# Patient Record
Sex: Male | Born: 1965
Health system: Southern US, Community
[De-identification: ages and names within clinical notes are randomized; demographics above are authoritative.]

## PROBLEM LIST (undated history)

## (undated) DIAGNOSIS — I10 Essential (primary) hypertension: Secondary | ICD-10-CM

## (undated) DIAGNOSIS — E781 Pure hyperglyceridemia: Secondary | ICD-10-CM

## (undated) DIAGNOSIS — K7460 Unspecified cirrhosis of liver: Secondary | ICD-10-CM

## (undated) DIAGNOSIS — T4145XA Adverse effect of unspecified anesthetic, initial encounter: Secondary | ICD-10-CM

## (undated) DIAGNOSIS — M109 Gout, unspecified: Secondary | ICD-10-CM

## (undated) DIAGNOSIS — E119 Type 2 diabetes mellitus without complications: Secondary | ICD-10-CM

## (undated) DIAGNOSIS — T8859XA Other complications of anesthesia, initial encounter: Secondary | ICD-10-CM

## (undated) DIAGNOSIS — Z8719 Personal history of other diseases of the digestive system: Secondary | ICD-10-CM

## (undated) DIAGNOSIS — R7303 Prediabetes: Secondary | ICD-10-CM

## (undated) DIAGNOSIS — K760 Fatty (change of) liver, not elsewhere classified: Secondary | ICD-10-CM

## (undated) DIAGNOSIS — N200 Calculus of kidney: Secondary | ICD-10-CM

## (undated) HISTORY — PX: COLONOSCOPY: SHX174

## (undated) HISTORY — DX: Gout, unspecified: M10.9

## (undated) HISTORY — DX: Fatty (change of) liver, not elsewhere classified: K76.0

## (undated) HISTORY — DX: Unspecified cirrhosis of liver: K74.60

## (undated) HISTORY — PX: VASECTOMY: SHX75

## (undated) HISTORY — PX: LIVER BIOPSY: SHX301

## (undated) HISTORY — DX: Calculus of kidney: N20.0

## (undated) HISTORY — DX: Prediabetes: R73.03

## (undated) HISTORY — PX: ESOPHAGOGASTRODUODENOSCOPY: SHX1529

## (undated) HISTORY — DX: Pure hyperglyceridemia: E78.1

---

## 1995-09-08 DIAGNOSIS — K7460 Unspecified cirrhosis of liver: Secondary | ICD-10-CM

## 1995-09-08 HISTORY — DX: Unspecified cirrhosis of liver: K74.60

## 2013-01-27 ENCOUNTER — Other Ambulatory Visit: Payer: Self-pay | Admitting: *Deleted

## 2013-01-27 MED ORDER — FLUTICASONE PROPIONATE 50 MCG/ACT NA SUSP: 2.0000 | Freq: Every day | NASAL | Status: DC

## 2013-02-01 ENCOUNTER — Other Ambulatory Visit: Payer: Self-pay | Admitting: *Deleted

## 2013-02-01 MED ORDER — FLUTICASONE PROPIONATE 50 MCG/ACT NA SUSP: 2.0000 | Freq: Every day | NASAL | Status: DC

## 2013-02-08 ENCOUNTER — Other Ambulatory Visit: Payer: Self-pay | Admitting: *Deleted

## 2013-02-09 ENCOUNTER — Other Ambulatory Visit: Payer: Self-pay | Admitting: *Deleted

## 2013-02-09 MED ORDER — HYOSCYAMINE SULFATE ER 0.375 MG PO TB12: 0.3750 mg | ORAL_TABLET | Freq: Two times a day (BID) | ORAL | Status: DC | PRN

## 2013-02-09 MED ORDER — PANTOPRAZOLE SODIUM 40 MG PO TBEC: 40.0000 mg | DELAYED_RELEASE_TABLET | Freq: Every day | ORAL | Status: DC

## 2013-03-17 ENCOUNTER — Other Ambulatory Visit: Payer: Self-pay | Admitting: Family Medicine

## 2013-03-17 ENCOUNTER — Encounter: Payer: Self-pay | Admitting: Family Medicine

## 2013-03-24 ENCOUNTER — Encounter: Payer: Self-pay | Admitting: Nurse Practitioner

## 2013-03-24 ENCOUNTER — Ambulatory Visit (INDEPENDENT_AMBULATORY_CARE_PROVIDER_SITE_OTHER): Payer: BC Managed Care – PPO | Admitting: Nurse Practitioner

## 2013-03-24 ENCOUNTER — Encounter: Payer: Self-pay | Admitting: Family Medicine

## 2013-03-24 VITALS — BP 126/90 | Temp 98.4°F | Wt 174.2 lb

## 2013-03-24 DIAGNOSIS — J069 Acute upper respiratory infection, unspecified: Secondary | ICD-10-CM

## 2013-03-24 DIAGNOSIS — K219 Gastro-esophageal reflux disease without esophagitis: Secondary | ICD-10-CM

## 2013-03-24 MED ORDER — HYDROCODONE-HOMATROPINE 5-1.5 MG/5ML PO SYRP: 5.0000 mL | ORAL_SOLUTION | ORAL | Status: DC | PRN

## 2013-03-24 MED ORDER — PANTOPRAZOLE SODIUM 40 MG PO TBEC: 40.0000 mg | DELAYED_RELEASE_TABLET | Freq: Every day | ORAL | Status: DC

## 2013-03-24 MED ORDER — LEVOFLOXACIN 500 MG PO TABS: 500.0000 mg | ORAL_TABLET | Freq: Every day | ORAL | Status: DC

## 2013-03-24 MED ORDER — PREDNISONE 20 MG PO TABS: ORAL_TABLET | ORAL | Status: DC

## 2013-03-24 NOTE — Progress Notes (Signed)
Subjective:  Presents complaints of cough and congestion that began this morning. No fever. Minimal vomiting. Slightly loose stools. His pharmacy did not have his Protonix, had to switch to omeprazole which is now working as well. Frequent cough. Runny nose. Facial area pressure. No headache. No sore throat. Some ear pain. No wheezing. Mild off and on dizziness. Nothing severe at this point.  Objective:   BP 126/90  Temp(Src) 98.4 F (36.9 C) (Oral)  Wt 174 lb 3.2 oz (79.017 kg) NAD. Alert, oriented. TMs mild clear effusion, no erythema. Pharynx injected with slightly green PND noted. Neck supple with mild soft slightly tender adenopathy. Lungs clear. Heart regular rate rhythm. Abdomen soft nondistended with distinct epigastric area tenderness noted.   Assessment:Acute upper respiratory infection  GERD (gastroesophageal reflux disease)  Plan: Meds ordered this encounter  Medications  . DISCONTD: pantoprazole (PROTONIX) 40 MG tablet    Sig: Take 1 tablet (40 mg total) by mouth daily.    Dispense:  90 tablet    Refill:  3    Needs office visit    Order Specific Question:  Supervising Provider    Answer:  Merlyn Albert [[id number]]  . pantoprazole (PROTONIX) 40 MG tablet    Sig: Take 1 tablet (40 mg total) by mouth daily.    Dispense:  90 tablet    Refill:  0    Needs office visit    Order Specific Question:  Supervising Provider    Answer:  Merlyn Albert [[id number]]  . levofloxacin (LEVAQUIN) 500 MG tablet    Sig: Take 1 tablet (500 mg total) by mouth daily.    Dispense:  10 tablet    Refill:  0    Order Specific Question:  Supervising Provider    Answer:  Merlyn Albert [[id number]]  . HYDROcodone-homatropine (HYCODAN) 5-1.5 MG/5ML syrup    Sig: Take 5 mLs by mouth every 4 (four) hours as needed for cough.    Dispense:  120 mL    Refill:  0    Order Specific Question:  Supervising Provider    Answer:  Merlyn Albert [[id number]]  . predniSONE (DELTASONE) 20 MG tablet    Sig: 3 po qd  x 3 d then 2 po qd x 3 d then 1 po qd x 3 d    Dispense:  18 tablet    Refill:  0    Order Specific Question:  Supervising Provider    Answer:  Merlyn Albert [[id number]]   Restart Protonix as directed. Hold on prednisone prescription in case it is needed over the weekend for sinus pressure. Call back next week if no improvement, sooner if worse.

## 2013-03-24 NOTE — Assessment & Plan Note (Signed)
Restart Protonix as directed.

## 2013-03-31 ENCOUNTER — Encounter: Payer: Self-pay | Admitting: Family Medicine

## 2013-03-31 ENCOUNTER — Ambulatory Visit (INDEPENDENT_AMBULATORY_CARE_PROVIDER_SITE_OTHER): Payer: BC Managed Care – PPO | Admitting: Family Medicine

## 2013-03-31 VITALS — BP 112/78 | Ht 67.0 in | Wt 176.6 lb

## 2013-03-31 DIAGNOSIS — E785 Hyperlipidemia, unspecified: Secondary | ICD-10-CM | POA: Insufficient documentation

## 2013-03-31 DIAGNOSIS — Z720 Tobacco use: Secondary | ICD-10-CM | POA: Insufficient documentation

## 2013-03-31 DIAGNOSIS — F17200 Nicotine dependence, unspecified, uncomplicated: Secondary | ICD-10-CM

## 2013-03-31 DIAGNOSIS — R7309 Other abnormal glucose: Secondary | ICD-10-CM

## 2013-03-31 DIAGNOSIS — Z79899 Other long term (current) drug therapy: Secondary | ICD-10-CM

## 2013-03-31 DIAGNOSIS — Z0000 Encounter for general adult medical examination without abnormal findings: Secondary | ICD-10-CM

## 2013-03-31 DIAGNOSIS — R739 Hyperglycemia, unspecified: Secondary | ICD-10-CM

## 2013-03-31 DIAGNOSIS — M109 Gout, unspecified: Secondary | ICD-10-CM | POA: Insufficient documentation

## 2013-03-31 MED ORDER — DICYCLOMINE HCL 20 MG PO TABS: 20.0000 mg | ORAL_TABLET | Freq: Three times a day (TID) | ORAL | Status: DC | PRN

## 2013-03-31 MED ORDER — ALLOPURINOL 300 MG PO TABS: 300.0000 mg | ORAL_TABLET | Freq: Every day | ORAL | Status: DC

## 2013-03-31 MED ORDER — GEMFIBROZIL 600 MG PO TABS: 600.0000 mg | ORAL_TABLET | Freq: Two times a day (BID) | ORAL | Status: DC

## 2013-03-31 MED ORDER — PRAVASTATIN SODIUM 20 MG PO TABS: 20.0000 mg | ORAL_TABLET | Freq: Every day | ORAL | Status: DC

## 2013-03-31 MED ORDER — FLUTICASONE PROPIONATE 50 MCG/ACT NA SUSP: NASAL | Status: DC

## 2013-03-31 NOTE — Progress Notes (Signed)
  Subjective:    Patient ID: Gregory Thompson, male    DOB: [birth year], 47 y.o.   MRN: [id number]  HPI patient in today for wellness exam. Takes all his medicines. Denies any chest tightness pressure pain denies rectal bleeding hematuria. Denies joint discomforts. He does relate that he is having intermittent abdominal cramping that goes along with his irritable bowel. He is here today for a wellness exam. Safety measures dietary measures all discussed. He has no family history of prostate cancer or colon cancer.    Review of Systems  Constitutional: Negative for fever, activity change and appetite change.  HENT: Negative for congestion, rhinorrhea and neck pain.   Eyes: Negative for discharge.  Respiratory: Negative for cough and wheezing.   Cardiovascular: Negative for chest pain.  Gastrointestinal: Negative for vomiting, abdominal pain and blood in stool.       He does get intermittent abdominal cramping and discomfort associated with his irritable bowel along with intermittent loose stools. Nonbloody.  Genitourinary: Negative for frequency and difficulty urinating.  Skin: Negative for rash.  Allergic/Immunologic: Negative for environmental allergies and food allergies.  Neurological: Negative for weakness and headaches.  Psychiatric/Behavioral: Negative for agitation.       Objective:   Physical Exam  Nursing note and vitals reviewed. Constitutional: He appears well-developed and well-nourished.  HENT:  Head: Normocephalic and atraumatic.  Right Ear: External ear normal.  Left Ear: External ear normal.  Nose: Nose normal.  Mouth/Throat: Oropharynx is clear and moist.  Eyes: EOM are normal. Pupils are equal, round, and reactive to light.  Neck: Normal range of motion. Neck supple. No thyromegaly present.  Cardiovascular: Normal rate, regular rhythm and normal heart sounds.   No murmur heard. Pulmonary/Chest: Effort normal and breath sounds normal. No respiratory distress. He  has no wheezes.  Abdominal: Soft. Bowel sounds are normal. He exhibits no distension and no mass. There is no tenderness.  Genitourinary: Penis normal.  Musculoskeletal: Normal range of motion. He exhibits no edema.  Lymphadenopathy:    He has no cervical adenopathy.  Neurological: He is alert. He exhibits normal muscle tone.  Skin: Skin is warm and dry. No erythema.  Psychiatric: He has a normal mood and affect. His behavior is normal. Judgment normal.          Assessment & Plan:  Wellness-safety measures dietary measures all discussed. Patient does have general health issues that need followup on lab work these were ordered. Continue current medications. Try bentyl 3 times a day when necessary if it works better than Levbid then continued this new approach followup again in 6 months sooner problems he was counseled to quit smoking

## 2013-05-05 ENCOUNTER — Encounter: Payer: Self-pay | Admitting: Family Medicine

## 2013-05-05 ENCOUNTER — Ambulatory Visit (INDEPENDENT_AMBULATORY_CARE_PROVIDER_SITE_OTHER): Payer: BC Managed Care – PPO | Admitting: Nurse Practitioner

## 2013-05-05 ENCOUNTER — Encounter: Payer: Self-pay | Admitting: Nurse Practitioner

## 2013-05-05 VITALS — BP 132/78 | Ht 67.0 in | Wt 175.6 lb

## 2013-05-05 DIAGNOSIS — M62838 Other muscle spasm: Secondary | ICD-10-CM

## 2013-05-05 MED ORDER — METHOCARBAMOL 750 MG PO TABS: 750.0000 mg | ORAL_TABLET | Freq: Three times a day (TID) | ORAL | Status: DC

## 2013-05-09 ENCOUNTER — Encounter: Payer: Self-pay | Admitting: Nurse Practitioner

## 2013-05-09 NOTE — Progress Notes (Signed)
Subjective:  Presents complaints of upper back pain for the past 4 days. No specific history of injury. Complaints of muscle spasms and pain with movement of his arms above his head.  Objective:   BP 132/78  Ht 5\' 7"  (1.702 m)  Wt 175 lb 9.6 oz (79.652 kg)  BMI 27.5 kg/m2 NAD. Alert, oriented. Lungs clear. Heart regular rate rhythm. Extremely tight tender muscles noted along the neck and upper back area. Good ROM of the neck with minimal tenderness. No shoulder joint line tenderness. Limited active ROM of both shoulders due to tenderness along the trapezius area. Hand and arm strength 5+ bilateral. Strong radial pulses bilaterally. Sensation grossly intact.  Assessment:Muscle spasms of head and/or neck  Plan: Meds ordered this encounter  Medications  . methocarbamol (ROBAXIN) 750 MG tablet    Sig: Take 1 tablet (750 mg total) by mouth 3 (three) times daily. Prn muscle spasms    Dispense:  30 tablet    Refill:  0    Order Specific Question:  Supervising Provider    Answer:  Merlyn Albert [[id number]]   Hold on anti-inflammatories due to GI symptoms. Ice/heat to affected area. Stretching exercises. Massage therapy. Call back if symptoms persist.

## 2013-06-16 ENCOUNTER — Telehealth: Payer: Self-pay | Admitting: Family Medicine

## 2013-06-16 MED ORDER — HYOSCYAMINE SULFATE ER 0.375 MG PO TB12: 0.3750 mg | ORAL_TABLET | Freq: Two times a day (BID) | ORAL | Status: DC | PRN

## 2013-06-16 NOTE — Telephone Encounter (Signed)
Please clarify with the patient that he is talking about levbid, this is the long-acting form of Tikosyn mean which is a once twice daily he'll. If so he may have 60 with 5 refills.

## 2013-06-16 NOTE — Telephone Encounter (Signed)
Rx sent electronically to [facility]. Patient notified.

## 2013-06-16 NOTE — Telephone Encounter (Signed)
Patient is not satisfied with change in stomach medication, wants to go back to hyoscyamine. Would a refill of this to [facility].

## 2013-07-25 ENCOUNTER — Encounter: Payer: Self-pay | Admitting: Family Medicine

## 2013-07-25 ENCOUNTER — Ambulatory Visit (INDEPENDENT_AMBULATORY_CARE_PROVIDER_SITE_OTHER): Payer: BC Managed Care – PPO | Admitting: Family Medicine

## 2013-07-25 VITALS — BP 124/88 | Ht 68.0 in | Wt 173.0 lb

## 2013-07-25 DIAGNOSIS — M62838 Other muscle spasm: Secondary | ICD-10-CM

## 2013-07-25 MED ORDER — DIAZEPAM 5 MG PO TABS: 5.0000 mg | ORAL_TABLET | Freq: Every evening | ORAL | Status: DC | PRN

## 2013-07-25 NOTE — Progress Notes (Signed)
  Subjective:    Patient ID: Gregory Thompson, male    DOB: September 23, 1965, 47 y.o.   MRN: [id number]  HPILeft hip pain radiating down left leg. Started 3 days. Taking tylenol. No relief.   Started sund, lower back  Leg was hurting, feeling like spasms  Went to work, hadf more pain  Severe pain today  Hurting thru the night.  No numbness in leg, no weakness  Was using clutch Just using tylenol   Review of Systems No back pain no flank pain no weight loss no weight gain ROS otherwise negative    Objective:   Physical Exam  Alert and significant distress. Lungs clear. Heart rare rhythm. Spine nontender. Negative straight leg raise. Pulses sensation good. Deep thigh tenderness to palpation.      Assessment & Plan:  Impression muscle spasm severe plan diazepam 5 mg 4 times a day. Local measures discussed. Work excuse given. Expect gradual resolution. WSL

## 2013-11-07 ENCOUNTER — Other Ambulatory Visit: Payer: Self-pay | Admitting: *Deleted

## 2013-11-07 MED ORDER — FLUTICASONE PROPIONATE 50 MCG/ACT NA SUSP: NASAL | Status: DC

## 2013-11-11 ENCOUNTER — Other Ambulatory Visit: Payer: Self-pay | Admitting: Family Medicine

## 2013-11-11 ENCOUNTER — Encounter: Payer: Self-pay | Admitting: *Deleted

## 2013-11-11 NOTE — Telephone Encounter (Signed)
Last seen 07/25/13

## 2013-11-14 ENCOUNTER — Ambulatory Visit (INDEPENDENT_AMBULATORY_CARE_PROVIDER_SITE_OTHER): Payer: BC Managed Care – PPO | Admitting: Family Medicine

## 2013-11-14 ENCOUNTER — Encounter: Payer: Self-pay | Admitting: Family Medicine

## 2013-11-14 VITALS — BP 138/90 | Ht 68.0 in | Wt 182.0 lb

## 2013-11-14 DIAGNOSIS — E785 Hyperlipidemia, unspecified: Secondary | ICD-10-CM

## 2013-11-14 DIAGNOSIS — M5431 Sciatica, right side: Secondary | ICD-10-CM

## 2013-11-14 DIAGNOSIS — R7309 Other abnormal glucose: Secondary | ICD-10-CM

## 2013-11-14 DIAGNOSIS — M109 Gout, unspecified: Secondary | ICD-10-CM

## 2013-11-14 DIAGNOSIS — M5430 Sciatica, unspecified side: Secondary | ICD-10-CM

## 2013-11-14 DIAGNOSIS — K219 Gastro-esophageal reflux disease without esophagitis: Secondary | ICD-10-CM

## 2013-11-14 DIAGNOSIS — R739 Hyperglycemia, unspecified: Secondary | ICD-10-CM

## 2013-11-14 DIAGNOSIS — Z79899 Other long term (current) drug therapy: Secondary | ICD-10-CM

## 2013-11-14 MED ORDER — PREDNISONE 20 MG PO TABS: ORAL_TABLET | ORAL | Status: AC

## 2013-11-14 MED ORDER — CHLORZOXAZONE 500 MG PO TABS: 500.0000 mg | ORAL_TABLET | Freq: Four times a day (QID) | ORAL | Status: DC | PRN

## 2013-11-14 MED ORDER — HYDROCODONE-ACETAMINOPHEN 7.5-325 MG PO TABS: 1.0000 | ORAL_TABLET | Freq: Four times a day (QID) | ORAL | Status: DC | PRN

## 2013-11-14 NOTE — Progress Notes (Signed)
   Subjective:    Patient ID: Gregory Thompson, male    DOB: 07/12/1966, 48 y.o.   MRN: [id number]  HPI Right hip pain radiating down to right knee.   Started Saturday. Worse with rolling in bed. No numbness Pain - 8/10 at its worse  Taking tylenol. No relief.   Leg was hurting, feeling like spasms, worst then last time in his left leg.   Weakness in right knee.  Discomfort in his back on the left side.  Patient states he has not gotten his lab work in a while he is trying to watch I. EDC still smokes he knows he needs to quit he denies chest pain shortness of breath nausea vomiting diarrhea he does relate intermittent abdominal pain  Review of Systems  Constitutional: Negative for fever, activity change and appetite change.  HENT: Negative for congestion and rhinorrhea.   Eyes: Negative for discharge.  Respiratory: Negative for cough and wheezing.   Cardiovascular: Negative for chest pain.  Gastrointestinal: Positive for abdominal pain. Negative for vomiting and blood in stool.  Genitourinary: Negative for frequency and difficulty urinating.  Musculoskeletal: Positive for arthralgias. Negative for neck pain.  Skin: Negative for rash.  Allergic/Immunologic: Negative for environmental allergies and food allergies.  Neurological: Negative for weakness and headaches.  Psychiatric/Behavioral: Negative for agitation.       Objective:   Physical Exam  Constitutional: He appears well-developed and well-nourished.  HENT:  Head: Normocephalic and atraumatic.  Mouth/Throat: Oropharynx is clear and moist.  Eyes: EOM are normal.  Neck: Neck supple. No thyromegaly present.  Cardiovascular: Normal rate, regular rhythm and normal heart sounds.   No murmur heard. Pulmonary/Chest: Effort normal and breath sounds normal. No respiratory distress. He has no wheezes.  Abdominal: Soft. Bowel sounds are normal. He exhibits no distension and no mass. There is no tenderness.  Musculoskeletal:  Normal range of motion. He exhibits no edema.  Lymphadenopathy:    He has no cervical adenopathy.  Neurological: He is alert. He exhibits normal muscle tone.  Skin: Skin is warm and dry. No erythema.  Psychiatric: He has a normal mood and affect. His behavior is normal. Judgment normal.          Assessment & Plan:  #1 IBS stable overall #2 reflux continue current medication #3 hyperlipidemia check lab work #4 sciatica hip pain hydrocodone prednisone followup if ongoing trouble no need for x-rays her MRI currently  Patient was given work note for several days the rest of the week.

## 2014-01-03 ENCOUNTER — Ambulatory Visit (INDEPENDENT_AMBULATORY_CARE_PROVIDER_SITE_OTHER): Payer: BC Managed Care – PPO | Admitting: Family Medicine

## 2014-01-03 ENCOUNTER — Encounter: Payer: Self-pay | Admitting: Family Medicine

## 2014-01-03 VITALS — BP 118/76 | Temp 98.2°F | Ht 68.0 in | Wt 185.5 lb

## 2014-01-03 DIAGNOSIS — J329 Chronic sinusitis, unspecified: Secondary | ICD-10-CM

## 2014-01-03 DIAGNOSIS — J310 Chronic rhinitis: Secondary | ICD-10-CM

## 2014-01-03 DIAGNOSIS — A084 Viral intestinal infection, unspecified: Secondary | ICD-10-CM

## 2014-01-03 DIAGNOSIS — A088 Other specified intestinal infections: Secondary | ICD-10-CM

## 2014-01-03 MED ORDER — ONDANSETRON 4 MG PO TBDP: 4.0000 mg | ORAL_TABLET | Freq: Three times a day (TID) | ORAL | Status: DC | PRN

## 2014-01-03 MED ORDER — LEVOFLOXACIN 500 MG PO TABS: 500.0000 mg | ORAL_TABLET | Freq: Every day | ORAL | Status: DC

## 2014-01-03 NOTE — Progress Notes (Signed)
   Subjective:    Patient ID: Gregory Thompson, male    DOB: 10/13/1965, 48 y.o.   MRN: [id number]  Sinusitis This is a new problem. The current episode started 1 to 4 weeks ago. The problem is unchanged. There has been no fever. His pain is at a severity of 5/10. The pain is moderate. Associated symptoms include congestion, headaches, sinus pressure and a sore throat. (Vomiting, diarrhea, drainage) Past treatments include oral decongestants. The treatment provided no relief.  Patient states that he has no other concerns at this time.  Frontal hewd ache tyl sinus no measureable fever vom three times, no stom stuff   Pos smoker   Review of Systems  HENT: Positive for congestion, sinus pressure and sore throat.   Neurological: Positive for headaches.   no dysuria ROS otherwise negative     Objective:   Physical Exam  Alert mild malaise. H&T moderate nasal frontal congestion tenderness pharynx slight erythema neck supple. Lungs clear heart regular in rhythm. Bronchial cough during exam. Abdomen benign.      Assessment & Plan:  #1 acute rhinosinusitis #2 acute gastroenteritis plan Levaquin 500 daily 10 days. Encouraged to stop smoking. Zofran when necessary for nausea. Imodium when necessary. WSL

## 2014-02-27 ENCOUNTER — Ambulatory Visit (INDEPENDENT_AMBULATORY_CARE_PROVIDER_SITE_OTHER): Payer: PRIVATE HEALTH INSURANCE | Admitting: Family Medicine

## 2014-02-27 ENCOUNTER — Encounter: Payer: Self-pay | Admitting: Family Medicine

## 2014-02-27 VITALS — BP 132/82 | Ht 68.0 in | Wt 186.6 lb

## 2014-02-27 DIAGNOSIS — K589 Irritable bowel syndrome without diarrhea: Secondary | ICD-10-CM

## 2014-02-27 DIAGNOSIS — E785 Hyperlipidemia, unspecified: Secondary | ICD-10-CM

## 2014-02-27 DIAGNOSIS — R739 Hyperglycemia, unspecified: Secondary | ICD-10-CM

## 2014-02-27 DIAGNOSIS — M1A4790 Other secondary chronic gout, unspecified ankle and foot, without tophus (tophi): Secondary | ICD-10-CM

## 2014-02-27 DIAGNOSIS — M1A00X0 Idiopathic chronic gout, unspecified site, without tophus (tophi): Secondary | ICD-10-CM

## 2014-02-27 DIAGNOSIS — K219 Gastro-esophageal reflux disease without esophagitis: Secondary | ICD-10-CM

## 2014-02-27 DIAGNOSIS — R7309 Other abnormal glucose: Secondary | ICD-10-CM

## 2014-02-27 DIAGNOSIS — E781 Pure hyperglyceridemia: Secondary | ICD-10-CM | POA: Insufficient documentation

## 2014-02-27 DIAGNOSIS — Z0000 Encounter for general adult medical examination without abnormal findings: Secondary | ICD-10-CM

## 2014-02-27 MED ORDER — PRAVASTATIN SODIUM 20 MG PO TABS: 20.0000 mg | ORAL_TABLET | Freq: Every day | ORAL | Status: DC

## 2014-02-27 MED ORDER — ALLOPURINOL 300 MG PO TABS: 300.0000 mg | ORAL_TABLET | Freq: Every day | ORAL | Status: DC

## 2014-02-27 MED ORDER — HYOSCYAMINE SULFATE ER 0.375 MG PO TB12: ORAL_TABLET | ORAL | Status: DC

## 2014-02-27 MED ORDER — PANTOPRAZOLE SODIUM 40 MG PO TBEC: 40.0000 mg | DELAYED_RELEASE_TABLET | Freq: Every day | ORAL | Status: DC

## 2014-02-27 MED ORDER — GEMFIBROZIL 600 MG PO TABS: 600.0000 mg | ORAL_TABLET | Freq: Two times a day (BID) | ORAL | Status: DC

## 2014-02-27 NOTE — Progress Notes (Signed)
   Subjective:    Patient ID: Elmer BalesStephen D Wheless, male    DOB: 01/08/1966, 48 y.o.   MRN: [id number]  HPI  The patient comes in today for a wellness visit.    A review of their health history was completed.  A review of medications was also completed.  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   Additional concerns: none  Review of Systems  Constitutional: Negative for fever, activity change and appetite change.  HENT: Negative for congestion and rhinorrhea.   Eyes: Negative for discharge.  Respiratory: Negative for cough and wheezing.   Cardiovascular: Negative for chest pain.  Gastrointestinal: Negative for vomiting, abdominal pain and blood in stool.  Genitourinary: Negative for frequency and difficulty urinating.  Musculoskeletal: Negative for neck pain.  Skin: Negative for rash.  Allergic/Immunologic: Negative for environmental allergies and food allergies.  Neurological: Negative for weakness and headaches.  Psychiatric/Behavioral: Negative for agitation.       Objective:   Physical Exam  Constitutional: He appears well-developed and well-nourished.  HENT:  Head: Normocephalic and atraumatic.  Right Ear: External ear normal.  Left Ear: External ear normal.  Nose: Nose normal.  Mouth/Throat: Oropharynx is clear and moist.  Eyes: EOM are normal. Pupils are equal, round, and reactive to light.  Neck: Normal range of motion. Neck supple. No thyromegaly present.  Cardiovascular: Normal rate, regular rhythm and normal heart sounds.   No murmur heard. Pulmonary/Chest: Effort normal and breath sounds normal. No respiratory distress. He has no wheezes.  Abdominal: Soft. Bowel sounds are normal. He exhibits no distension and no mass. There is no tenderness.  Genitourinary: Penis normal.  No hernias.    Musculoskeletal: Normal range of motion. He exhibits no edema.  Lymphadenopathy:    He has no cervical adenopathy.  Neurological: He is alert. He exhibits normal muscle tone.  Skin: Skin is warm and dry. No erythema.  Psychiatric: He has a normal mood and affect. His behavior is normal. Judgment normal.          Assessment & Plan:  1. Routine general medical examination at a health care facility Safety measures dietary measures discussed in detail.  2. Other secondary chronic gout of foot He is to continue his medication he is to get his lab work checked healthy diet - Uric acid  3. Hyperlipidemia Low-fat low fried food diet regular exercise. Patient has history of elevated triglycerides as well needs his medicine needs to do his lab work also - Basic metabolic panel - Lipid panel - Hepatic function panel - Hemoglobin A1c  4. Gastroesophageal reflux disease without esophagitis He states if he does not take his medicine he suffers with severe reflux therefore he is to continue his medication - Basic metabolic panel - Hepatic function panel  5. Hyperglycemia Patient knows he needs to watch his starches and sugars in his diet. He is to do his lab work. - Basic metabolic panel - Hemoglobin A1c  6. Hypertriglyceridemia Patient is continue his medicine watch diet and do his lab work  7. Irritable bowel syndrome Patient is to eat healthy continue on his medication.

## 2014-03-10 ENCOUNTER — Other Ambulatory Visit: Payer: Self-pay | Admitting: Nurse Practitioner

## 2014-08-31 ENCOUNTER — Other Ambulatory Visit: Payer: Self-pay | Admitting: Family Medicine

## 2015-01-27 ENCOUNTER — Other Ambulatory Visit: Payer: Self-pay | Admitting: Family Medicine

## 2015-01-28 NOTE — Telephone Encounter (Signed)
1 refill, send message to the patient for office visit

## 2015-02-14 ENCOUNTER — Ambulatory Visit: Payer: PRIVATE HEALTH INSURANCE | Admitting: Family Medicine

## 2015-02-18 ENCOUNTER — Telehealth: Payer: Self-pay | Admitting: Family Medicine

## 2015-02-18 DIAGNOSIS — E785 Hyperlipidemia, unspecified: Secondary | ICD-10-CM

## 2015-02-18 DIAGNOSIS — R739 Hyperglycemia, unspecified: Secondary | ICD-10-CM

## 2015-02-18 NOTE — Telephone Encounter (Signed)
Calling to get blood work ordered.  He said that he had an appointment on 03/21/15 but had to cancel because Dr. Lorin Picket wasn't going to be in the office.  He said he was not going to reschedule this appointment because that's the day that he took off and it's our fault that the doctor will not be here.  So he says that he will have his blood work done that day and Dr. Lorin Picket can call him with the results.

## 2015-02-20 NOTE — Telephone Encounter (Signed)
Let pt know that we will pass on his message to dr scott , let him also know we have to shift our schedule around at [place] and that is a reality.

## 2015-02-25 NOTE — Telephone Encounter (Signed)
The patient can do lipid, liver, metabolic 7, hemoglobin A1c-hyperlipidemia, hyperglycemia. Please apologize to the patient for this inconvenience. We tried to do our best to know her schedule ahead of time but that is not always 100% possible. I would recommend that the patient look at scheduling possibly and August or September but he may want to wait until he gets closer. When he has some dates in mind he can call those to me and I would be happy to try as best as possible to be present on that one of the days that he is requesting.

## 2015-02-25 NOTE — Telephone Encounter (Signed)
Blood work ordered in [employer]. Patient to check schedule to see if he is able to schedule another appt.

## 2015-02-27 ENCOUNTER — Telehealth: Payer: Self-pay | Admitting: Family Medicine

## 2015-02-27 NOTE — Telephone Encounter (Signed)
Error

## 2015-02-28 ENCOUNTER — Other Ambulatory Visit: Payer: Self-pay | Admitting: Family Medicine

## 2015-03-21 ENCOUNTER — Ambulatory Visit: Payer: PRIVATE HEALTH INSURANCE | Admitting: Family Medicine

## 2015-03-22 LAB — HEPATIC FUNCTION PANEL
ALBUMIN: 4.8 g/dL (ref 3.5–5.5)
ALK PHOS: 50 IU/L (ref 39–117)
ALT: 32 IU/L (ref 0–44)
AST: 28 IU/L (ref 0–40)
BILIRUBIN TOTAL: 0.4 mg/dL (ref 0.0–1.2)
Bilirubin, Direct: 0.15 mg/dL (ref 0.00–0.40)
Total Protein: 7.5 g/dL (ref 6.0–8.5)

## 2015-03-22 LAB — BASIC METABOLIC PANEL
BUN/Creatinine Ratio: 14 (ref 9–20)
BUN: 14 mg/dL (ref 6–24)
CO2: 23 mmol/L (ref 18–29)
Calcium: 10 mg/dL (ref 8.7–10.2)
Chloride: 99 mmol/L (ref 97–108)
Creatinine, Ser: 1 mg/dL (ref 0.76–1.27)
GFR calc Af Amer: 103 mL/min/{1.73_m2} (ref >59)
GFR, EST NON AFRICAN AMERICAN: 89 mL/min/{1.73_m2} (ref >59)
Glucose: 167 mg/dL — ABNORMAL HIGH (ref 65–99)
Potassium: 4.1 mmol/L (ref 3.5–5.2)
Sodium: 138 mmol/L (ref 134–144)

## 2015-03-22 LAB — LIPID PANEL
CHOL/HDL RATIO: 7.2 ratio — AB (ref 0.0–5.0)
CHOLESTEROL TOTAL: 179 mg/dL (ref 100–199)
HDL: 25 mg/dL — AB (ref >39)
Triglycerides: 425 mg/dL — ABNORMAL HIGH (ref 0–149)

## 2015-03-22 LAB — HEMOGLOBIN A1C
ESTIMATED AVERAGE GLUCOSE: 177 mg/dL
HEMOGLOBIN A1C: 7.8 % — AB (ref 4.8–5.6)

## 2015-03-26 ENCOUNTER — Other Ambulatory Visit: Payer: Self-pay | Admitting: Family Medicine

## 2015-03-28 ENCOUNTER — Other Ambulatory Visit: Payer: Self-pay | Admitting: Family Medicine

## 2015-03-29 ENCOUNTER — Encounter: Payer: Self-pay | Admitting: Family Medicine

## 2015-03-29 ENCOUNTER — Other Ambulatory Visit: Payer: Self-pay | Admitting: Family Medicine

## 2015-03-29 ENCOUNTER — Ambulatory Visit (INDEPENDENT_AMBULATORY_CARE_PROVIDER_SITE_OTHER): Payer: PRIVATE HEALTH INSURANCE | Admitting: Family Medicine

## 2015-03-29 VITALS — BP 128/80 | Temp 99.0°F | Ht 68.0 in | Wt 183.0 lb

## 2015-03-29 DIAGNOSIS — E781 Pure hyperglyceridemia: Secondary | ICD-10-CM | POA: Diagnosis not present

## 2015-03-29 DIAGNOSIS — M1000 Idiopathic gout, unspecified site: Secondary | ICD-10-CM | POA: Diagnosis not present

## 2015-03-29 DIAGNOSIS — E119 Type 2 diabetes mellitus without complications: Secondary | ICD-10-CM

## 2015-03-29 DIAGNOSIS — E785 Hyperlipidemia, unspecified: Secondary | ICD-10-CM | POA: Diagnosis not present

## 2015-03-29 DIAGNOSIS — K589 Irritable bowel syndrome without diarrhea: Secondary | ICD-10-CM

## 2015-03-29 DIAGNOSIS — K219 Gastro-esophageal reflux disease without esophagitis: Secondary | ICD-10-CM

## 2015-03-29 MED ORDER — PRAVASTATIN SODIUM 20 MG PO TABS: 20.0000 mg | ORAL_TABLET | Freq: Every day | ORAL | Status: DC

## 2015-03-29 MED ORDER — PANTOPRAZOLE SODIUM 40 MG PO TBEC: DELAYED_RELEASE_TABLET | ORAL | Status: DC

## 2015-03-29 MED ORDER — FLUTICASONE PROPIONATE 50 MCG/ACT NA SUSP: NASAL | Status: DC

## 2015-03-29 MED ORDER — METFORMIN HCL 500 MG PO TABS: 500.0000 mg | ORAL_TABLET | Freq: Two times a day (BID) | ORAL | Status: DC

## 2015-03-29 MED ORDER — GEMFIBROZIL 600 MG PO TABS: ORAL_TABLET | ORAL | Status: DC

## 2015-03-29 MED ORDER — ALLOPURINOL 300 MG PO TABS: 300.0000 mg | ORAL_TABLET | Freq: Every day | ORAL | Status: DC

## 2015-03-29 NOTE — Progress Notes (Signed)
   Subjective:    Patient ID: Gregory Thompson, male    DOB: 07-20-1966, 49 y.o.   MRN: [id number]  Hyperlipidemia This is a chronic problem. The current episode started more than 1 year ago. Pertinent negatives include no chest pain. There are no compliance problems (pt exercises and only eats once daily.).   has concerns about weight.  Had bloodwork done on 7/14. A1C on bloodwork 7.8.  Sinus symptoms. Sinus headache, drainage, cough. Started years ago.    Review of Systems  Constitutional: Negative for activity change, appetite change and fatigue.  HENT: Negative for congestion.   Respiratory: Negative for cough.   Cardiovascular: Negative for chest pain.  Gastrointestinal: Negative for abdominal pain.  Endocrine: Negative for polydipsia and polyphagia.  Neurological: Negative for weakness.  Psychiatric/Behavioral: Negative for confusion.       Objective:   Physical Exam  Constitutional: He appears well-nourished. No distress.  Cardiovascular: Normal rate, regular rhythm and normal heart sounds.   No murmur heard. Pulmonary/Chest: Effort normal and breath sounds normal. No respiratory distress.  Musculoskeletal: He exhibits no edema.  Lymphadenopathy:    He has no cervical adenopathy.  Neurological: He is alert.  Psychiatric: His behavior is normal.  Vitals reviewed.         Assessment & Plan:  Hyperlipidemia-triglycerides significantly elevated sugar under control and this will help  Irritable bowel continue current medications hopefully metformin won't irritate this  New onset diabetes the importance of getting A1c under control under 7 was discussed importance of diet as well start Augmentin 500 mg half tablet twice daily if after the next 2-3 weeks doing better then increase the dose to 1 tablet twice daily. If it bothers his irritable bowel to let us know, recheck A1c in 3-4 months  Patient was counseled to quit smoking  GERD-patient states one Protonix per  day not doing enough states it's getting worse would like to try to her day he states back when he saw gastroenterology help  Gout stable  Patient is getting over a viral illness no need for antibiotics

## 2015-04-01 ENCOUNTER — Telehealth: Payer: Self-pay | Admitting: Family Medicine

## 2015-04-01 ENCOUNTER — Other Ambulatory Visit: Payer: Self-pay | Admitting: *Deleted

## 2015-04-01 MED ORDER — METFORMIN HCL ER 500 MG PO TB24: 500.0000 mg | ORAL_TABLET | Freq: Every day | ORAL | Status: DC

## 2015-04-01 NOTE — Telephone Encounter (Signed)
Pt states that the metformin you put him on his causing him to  Be nauseated, abd discomfort, bubbly feeling.   [facility]   Please advise, he was told to call back with how this med made him feel

## 2015-04-01 NOTE — Telephone Encounter (Signed)
Discussed with pt. Pt willing to try the XR. Med sent to pharm. Pt to call back if any issues with med

## 2015-04-01 NOTE — Telephone Encounter (Signed)
Nurse's-please talk with the patient. I would recommend trying extended release metformin ( XR) 500 mg 1 per day take this in place of the other metformin see if he gets along better. If ongoing troubles for the patient to let us know. Please have the patient try the extended release for at least one week before deciding if to continue. Thank you may: #30 with 4 refills cancel the other metformin

## 2015-04-09 ENCOUNTER — Telehealth: Payer: Self-pay | Admitting: Family Medicine

## 2015-04-09 NOTE — Telephone Encounter (Signed)
Please let the patient know I reviewed over the glucose readings. I am pleased with progress. I would recommend keeping a follow-up office visit in approximately 3 months. Continue the extended release met [place]. Also recommend we will recheck hemoglobin A1c here in the office in approximately 3-4 months. It is possible we may need to go up on the dose of the medication depending on the results of the hemoglobin A1c. Given the readings and the improved diet, I would recommend that the patient check morning sugars approximately 3 or 4 days a week and check it evening sugar approximately 3 days a week. They can fill free to send me some readings in approximately 2-3 weeks to look at 

## 2015-04-09 NOTE — Telephone Encounter (Signed)
Pt dropped off his sugar readings. Message in basket

## 2015-04-10 NOTE — Telephone Encounter (Signed)
Notified patient reviewed over the glucose readings. Dr. Nicki Reaper is pleased with progress. Recommend keeping a follow-up office visit in approximately 3 months. Continue the extended release met Shanda Bumps. Also recommend we will recheck hemoglobin A1c here in the office in approximately 3-4 months. It is possible we may need to go up on the dose of the medication depending on the results of the hemoglobin A1c. Given the readings and the improved diet, Dr. Nicki Reaper would recommend that the patient check morning sugars approximately 3 or 4 days a week and check it evening sugar approximately 3 days a week. They can fill free to send him some readings in approximately 2-3 weeks to look at. Patient verbalized understanding.

## 2015-04-17 ENCOUNTER — Telehealth: Payer: Self-pay | Admitting: Family Medicine

## 2015-04-17 NOTE — Telephone Encounter (Signed)
It is generally recommended to wait 6 weeks of having good sugar readings before having eyes checked out

## 2015-04-17 NOTE — Telephone Encounter (Signed)
Notified patient it is generally recommended to wait 6 weeks of having good sugar readings before having eyes checked out. Patient verbalized understanding.

## 2015-04-17 NOTE — Telephone Encounter (Signed)
Pt is wanting to know if it is ok for him to go get his eyes checked out now that his sugar seems to be leveled out.

## 2015-04-25 ENCOUNTER — Other Ambulatory Visit: Payer: Self-pay | Admitting: Family Medicine

## 2015-04-26 ENCOUNTER — Other Ambulatory Visit: Payer: Self-pay | Admitting: Family Medicine

## 2015-04-26 ENCOUNTER — Telehealth: Payer: Self-pay | Admitting: Family Medicine

## 2015-04-26 NOTE — Telephone Encounter (Signed)
Pt dropped off his sugar readings. Message in box.

## 2015-04-30 NOTE — Telephone Encounter (Signed)
Please let the patient know that I did review his glucose readings. His evening readings are good. His morning readings some looked very good some are moderately elevated. There are choices. Currently I am pleased with how his sugar looks later in the day. I would recommend healthy eating. Minimize carbohydrates. Stick with the metformin 1 daily. And recheck the hemoglobin A1c in October. The patient may continue to monitor his sugars. It would be okay to check a morning sugar every other day and check an evening sugar 3 times a week. He can send [place] additional readings again in 2-3 weeks. If still doing well at that time we can reduce the frequency of glucose anger pricks even more.

## 2015-05-01 NOTE — Telephone Encounter (Signed)
Spoke with patient and informed him per Dr.Scott-Please let the patient know that Dr.Scott did review his glucose readings. His evening readings are good. His morning readings some looked very good some are moderately elevated. There are choices. Currently Dr.Scott is pleased with how his sugar looks later in the day. Dr.Scott would recommend healthy eating. Minimize carbohydrates. Stick with the metformin 1 daily. And recheck the hemoglobin A1c in October. The patient may continue to monitor his sugars. It would be okay to check a morning sugar every other day and check an evening sugar 3 times a week. He can send [place] additional readings again in 2-3 weeks. If still doing well at that time we can reduce the frequency of glucose anger pricks even more. Patient verbalized understanding.

## 2015-05-08 ENCOUNTER — Other Ambulatory Visit: Payer: Self-pay | Admitting: Family Medicine

## 2015-05-12 ENCOUNTER — Other Ambulatory Visit: Payer: Self-pay | Admitting: Family Medicine

## 2015-05-21 ENCOUNTER — Other Ambulatory Visit: Payer: Self-pay | Admitting: Family Medicine

## 2015-05-24 ENCOUNTER — Other Ambulatory Visit: Payer: Self-pay | Admitting: Family Medicine

## 2015-06-23 ENCOUNTER — Other Ambulatory Visit: Payer: Self-pay | Admitting: Family Medicine

## 2015-06-24 LAB — HM DIABETES EYE EXAM

## 2015-06-27 ENCOUNTER — Encounter: Payer: Self-pay | Admitting: *Deleted

## 2015-07-26 ENCOUNTER — Ambulatory Visit: Payer: PRIVATE HEALTH INSURANCE | Admitting: Family Medicine

## 2015-07-29 ENCOUNTER — Telehealth: Payer: Self-pay | Admitting: Family Medicine

## 2015-07-29 NOTE — Telephone Encounter (Signed)
Pt called to cancel his appt due to his youngest grandchild passing away of SIDS  This past weekend. There will be a funeral this Friday where his appt was to be.  He wants to know if its ok for him to reschedule for next month sometime or do  You want him in sooner than that? He states he has plenty of metformin

## 2015-07-29 NOTE — Telephone Encounter (Signed)
Pt unsure of work schedule will have to call back once he figures that out to make his A1C check up

## 2015-07-29 NOTE — Telephone Encounter (Signed)
Certainly sorry for his loss, reschedule into December

## 2015-08-02 ENCOUNTER — Other Ambulatory Visit: Payer: Self-pay | Admitting: Family Medicine

## 2015-08-02 ENCOUNTER — Ambulatory Visit: Payer: PRIVATE HEALTH INSURANCE | Admitting: Family Medicine

## 2015-08-12 ENCOUNTER — Other Ambulatory Visit: Payer: Self-pay | Admitting: Family Medicine

## 2015-08-18 ENCOUNTER — Other Ambulatory Visit: Payer: Self-pay | Admitting: Family Medicine

## 2015-09-02 ENCOUNTER — Other Ambulatory Visit: Payer: Self-pay | Admitting: Family Medicine

## 2015-09-18 ENCOUNTER — Other Ambulatory Visit: Payer: Self-pay | Admitting: Family Medicine

## 2015-09-30 ENCOUNTER — Telehealth: Payer: Self-pay | Admitting: Family Medicine

## 2015-09-30 MED ORDER — DIPHENOXYLATE-ATROPINE 2.5-0.025 MG PO TABS: ORAL_TABLET | ORAL | Status: DC

## 2015-09-30 NOTE — Telephone Encounter (Signed)
May use Lomotil 1 pill 3 times a day as needed for diarrhea-#30 with 1 refill, follow-up office visit if ongoing troubles may need further testing

## 2015-09-30 NOTE — Telephone Encounter (Signed)
Called patient and informed him per Dr.Scott Luking- May use Lomotil 1 pill 3 times a day as needed for diarrhea. Follow up office visit if ongoing troubles may need further testing. Patient verbalized understanding.

## 2015-09-30 NOTE — Telephone Encounter (Signed)
Pt states he has had diarrhea x1 wk, pure watery at this point, had to stay Home from work today because of it. He is wanting to know if there is something You can call in to help this stop? Or does he need to come in? No fever, eating  Drinking well, abd pain present is only other symptom.   Please advise

## 2015-10-03 ENCOUNTER — Other Ambulatory Visit: Payer: Self-pay | Admitting: Family Medicine

## 2015-10-07 ENCOUNTER — Encounter: Payer: Self-pay | Admitting: Family Medicine

## 2015-10-07 ENCOUNTER — Ambulatory Visit (INDEPENDENT_AMBULATORY_CARE_PROVIDER_SITE_OTHER): Payer: 59 | Admitting: Family Medicine

## 2015-10-07 VITALS — BP 122/88 | Temp 98.6°F | Ht 68.0 in | Wt 172.0 lb

## 2015-10-07 DIAGNOSIS — E119 Type 2 diabetes mellitus without complications: Secondary | ICD-10-CM | POA: Diagnosis not present

## 2015-10-07 DIAGNOSIS — R197 Diarrhea, unspecified: Secondary | ICD-10-CM

## 2015-10-07 DIAGNOSIS — K580 Irritable bowel syndrome with diarrhea: Secondary | ICD-10-CM | POA: Diagnosis not present

## 2015-10-07 LAB — POCT GLYCOSYLATED HEMOGLOBIN (HGB A1C): HEMOGLOBIN A1C: 5.2

## 2015-10-07 NOTE — Progress Notes (Signed)
   Subjective:    Patient ID: ARDIAN HABERLAND, male    DOB: 06-Oct-1965, 50 y.o.   MRN: [id number]  Diarrhea  This is a new problem. Episode onset: 1 week ago. Associated symptoms include abdominal pain. Treatments tried: lomotil. His past medical history is significant for inflammatory bowel disease.   Patient with frequent loose stools over the past few weeks become very annoying to him.   Review of Systems  Gastrointestinal: Positive for abdominal pain and diarrhea.   no bloody stools no chest congestion no cough no fever chills or sweats     Objective:   Physical Exam Lungs are clear hearts regular abdomen is soft extremities no edema skin warm dry  A1c is now 5.2 was over 7     Assessment & Plan:  Diarrhea-this could be related to IBS it is possible this could also be related to a malabsorption I doubt colitis. Could be related to metformin. Stop metformin. Give [place] feedback in a few days. Also check lab work  Diabetes A1c much better patient is done excellent job changing diet exercising and losing weight may not need to be on any medications at this point await the rest of the lab work.  May need referral to gastroenterology but at this point, do not believe so.

## 2015-10-14 ENCOUNTER — Telehealth: Payer: Self-pay | Admitting: Family Medicine

## 2015-10-14 MED ORDER — ELUXADOLINE 100 MG PO TABS: 100.0000 mg | ORAL_TABLET | Freq: Two times a day (BID) | ORAL | Status: DC

## 2015-10-14 NOTE — Telephone Encounter (Signed)
Spoke with patient and informed him per Dr.Scott Luking-viberzi 100 mg, 1 twice a day, #60, 3 refills-there is a possibility this could get denied it is a new or medicines that is high cost some insurance companies will not cover this-no way for [place] to know without submitting it first through the pharmacy. Patient verbalized understanding.

## 2015-10-14 NOTE — Telephone Encounter (Signed)
Patient wanted to update you from his last visit and states diarreah is better but not gone. Would like new prescription dicyclomine 20 mg. He wants to try medication you talked about at last office visit.

## 2015-10-14 NOTE — Telephone Encounter (Signed)
[facility] 10/14/15 

## 2015-10-14 NOTE — Telephone Encounter (Signed)
viberzi 100 mg, 1 twice a day, #60, 3 refills-there is a possibility this could get denied it is a new or medicines that is high cost some insurance companies will not cover this-no way for [place] to know without submitting it first through the pharmacy.

## 2015-10-15 ENCOUNTER — Other Ambulatory Visit: Payer: Self-pay | Admitting: Family Medicine

## 2015-10-15 ENCOUNTER — Telehealth: Payer: Self-pay | Admitting: Family Medicine

## 2015-10-15 NOTE — Telephone Encounter (Signed)
It is okay to take both of these medicines together but I am hopeful as his symptoms get better he can stop the hycosamine-I would try stopping it in approximately one week, patient will need follow-up in approximately 3 months to recheck A1c. Please let us know if any ongoing troubles.

## 2015-10-15 NOTE — Telephone Encounter (Signed)
Discussed with pt. Pt verbalized understanding.  °

## 2015-10-15 NOTE — Telephone Encounter (Signed)
Patient wants to know if he should continue taking IBS medication hyoscyamine 0.375 mg with his new medication viberzi .

## 2015-10-15 NOTE — Telephone Encounter (Signed)
Seen jan 30th for ibs. Prescribed viberzi. Just started the viberzi this am. Should he continue the hycosamine. Pt states he started feeling better on Sunday. He thinks the metformin was the cause of about 70% of his symptoms.

## 2015-10-16 ENCOUNTER — Telehealth: Payer: Self-pay | Admitting: Family Medicine

## 2015-10-16 DIAGNOSIS — K589 Irritable bowel syndrome without diarrhea: Secondary | ICD-10-CM

## 2015-10-16 NOTE — Telephone Encounter (Signed)
Eluxadoline (VIBERZI) 100 MG TABS  Pt states he took one pill, his sugars dropped to 52 an gave Him abd pains/cramps  Need to change he will not be taking it again. He did take it with food as Directed.   [facility] [place]

## 2015-10-16 NOTE — Telephone Encounter (Signed)
There are not many options for irritable bowel with diarrhea. Since he did not tolerate Viberzi I would recommend that he go back to his previous medicine I also highly recommend that he allow [place] to set him up with gastroenterology to see if they have any other things that they could offer him. Please put Viberzi on his allergy list-technically it is not an allergy but it is a side effect that by putting this into the Epic system it will minimize the chance that this would be prescribed again in the future thank if the patient is interested with gastroenterology referral we could set him up locally or in [place]- his choice

## 2015-10-17 ENCOUNTER — Other Ambulatory Visit: Payer: Self-pay | Admitting: *Deleted

## 2015-10-17 MED ORDER — DICYCLOMINE HCL 20 MG PO TABS: 20.0000 mg | ORAL_TABLET | Freq: Three times a day (TID) | ORAL | Status: DC | PRN

## 2015-10-17 NOTE — Telephone Encounter (Signed)
[facility] 10/17/15 

## 2015-10-17 NOTE — Telephone Encounter (Signed)
Patients wife called to check on this.  She is hoping medication can be called in soon.

## 2015-10-17 NOTE — Telephone Encounter (Signed)
Discussed with pt. Med sent to pharm. viberzi added to med list. Order for referral put in.

## 2015-10-17 NOTE — Telephone Encounter (Signed)
Spoke with patient wife and informed her per Dr.Scott Luking- there are not many options for IBS with Diarrhea. Since he did not tolerate Viberzi Dr.Scott would recommend that he goes back on previous medicine also highly recommend that he allow [place] to set him up with Gastroenterology to see if they have any other things that they could offer him. Viberzi added to patient allergy list.Patient's wife verbalized understanding. Referral for GI put into epic patient requesting to go to GI in [place]. Patient would also like to have a refill on Bentyl? Please advise?

## 2015-10-17 NOTE — Telephone Encounter (Signed)
Bentyl 20 mg, 1 3 times a day when necessary, #60, 4 refills

## 2015-10-22 ENCOUNTER — Encounter: Payer: Self-pay | Admitting: Family Medicine

## 2015-10-22 ENCOUNTER — Telehealth: Payer: Self-pay | Admitting: Family Medicine

## 2015-10-22 DIAGNOSIS — R109 Unspecified abdominal pain: Secondary | ICD-10-CM

## 2015-10-22 NOTE — Telephone Encounter (Signed)
Spoke with patient and informed her per Dr.Scott Luking-Lab test shows elevated Lipase which can indicate pancreatitis. I rec: repeat lipase/ also do amylase level as well, also schedule U?S abd with attn to pancreas/ruq region, also bland diet avoid fried foods. Patient verbalized understanding. (Labs ordered, and Ultrasound ordered).  Patient to call back and let us know when he can go for Ultrasound.

## 2015-10-22 NOTE — Addendum Note (Signed)
Addended by: Jeralene Peters on: 10/22/2015 01:49 PM   Modules accepted: Orders

## 2015-10-22 NOTE — Telephone Encounter (Signed)
Patient called back stating he could do the abdominal ultrasound on Monday 10/28/15. Abdominal ultrasound scheduled for Monday 10/28/15 at 10:30 am. Patient notified to be NPO for at least 8 hours prior to the ultrasound.

## 2015-10-22 NOTE — Telephone Encounter (Signed)
Lab test shows elevated Lipase which can indicate pancreatitis. I rec: repeat lipase/ also do amylase level as well, also schedule U?S abd with attn to pancreas/ruq region, also bland diet avoid fried foods ( also GI consult should be in the works- please confirm bcz I am sure family will ask)

## 2015-10-23 LAB — BASIC METABOLIC PANEL
BUN / CREAT RATIO: 16 (ref 9–20)
BUN: 16 mg/dL (ref 6–24)
CO2: 24 mmol/L (ref 18–29)
CREATININE: 1 mg/dL (ref 0.76–1.27)
Calcium: 9.9 mg/dL (ref 8.7–10.2)
Chloride: 100 mmol/L (ref 96–106)
GFR calc non Af Amer: 89 mL/min/{1.73_m2} (ref >59)
GFR, EST AFRICAN AMERICAN: 102 mL/min/{1.73_m2} (ref >59)
GLUCOSE: 115 mg/dL — AB (ref 65–99)
Potassium: 4.6 mmol/L (ref 3.5–5.2)
SODIUM: 141 mmol/L (ref 134–144)

## 2015-10-23 LAB — C-REACTIVE PROTEIN: CRP: 1.8 mg/L (ref 0.0–4.9)

## 2015-10-23 LAB — HEPATIC FUNCTION PANEL
ALT: 29 IU/L (ref 0–44)
AST: 21 IU/L (ref 0–40)
Albumin: 5 g/dL (ref 3.5–5.5)
Alkaline Phosphatase: 53 IU/L (ref 39–117)
BILIRUBIN TOTAL: 0.5 mg/dL (ref 0.0–1.2)
BILIRUBIN, DIRECT: 0.17 mg/dL (ref 0.00–0.40)
TOTAL PROTEIN: 7.8 g/dL (ref 6.0–8.5)

## 2015-10-23 LAB — SEDIMENTATION RATE: SED RATE: 2 mm/h (ref 0–15)

## 2015-10-23 LAB — LIPID PANEL
CHOL/HDL RATIO: 4.4 ratio (ref 0.0–5.0)
Cholesterol, Total: 174 mg/dL (ref 100–199)
HDL: 40 mg/dL (ref >39)
LDL Calculated: 114 mg/dL — ABNORMAL HIGH (ref 0–99)
TRIGLYCERIDES: 99 mg/dL (ref 0–149)
VLDL CHOLESTEROL CAL: 20 mg/dL (ref 5–40)

## 2015-10-23 LAB — LIPASE: LIPASE: 96 U/L — AB (ref 0–59)

## 2015-10-23 LAB — TISSUE TRANSGLUTAMINASE, IGA: Transglutaminase IgA: <2 U/mL (ref 0–3)

## 2015-10-28 ENCOUNTER — Ambulatory Visit (HOSPITAL_COMMUNITY)
Admission: RE | Admit: 2015-10-28 | Discharge: 2015-10-28 | Disposition: A | Discharge status: Home / Self Care | Payer: 59 | Source: Ambulatory Visit | Attending: Family Medicine | Admitting: Family Medicine

## 2015-10-28 ENCOUNTER — Telehealth: Payer: Self-pay | Admitting: Family Medicine

## 2015-10-28 DIAGNOSIS — K8020 Calculus of gallbladder without cholecystitis without obstruction: Secondary | ICD-10-CM | POA: Insufficient documentation

## 2015-10-28 DIAGNOSIS — R109 Unspecified abdominal pain: Secondary | ICD-10-CM | POA: Diagnosis present

## 2015-10-28 NOTE — Telephone Encounter (Signed)
Pt's wife dropped off the patient's morning blood sugar readings.   10/26/15     146 10/27/15     122 10/28/15     171

## 2015-10-29 ENCOUNTER — Telehealth: Payer: Self-pay | Admitting: Family Medicine

## 2015-10-29 LAB — LIPASE: Lipase: 65 U/L — ABNORMAL HIGH (ref 0–59)

## 2015-10-29 LAB — AMYLASE: Amylase: 46 U/L (ref 31–124)

## 2015-10-29 NOTE — Telephone Encounter (Signed)
Pt also wants u/s results forwarded to gi doctor before march 6th. appt march 6th.

## 2015-10-29 NOTE — Telephone Encounter (Signed)
Spoke with patient and informed her per Dr.Scott Luking- To get a better picture and therefore to be able to give a more educated response to what would be the best medicine I asked that the patient check his sugars in the morning and again in the evening 2 hours after supper for the next couple days and call us on Friday with those readings then we can decide what next. I realize to that average person one would think any medication would help but it is not that simple therefore more readings would help [place]. Patient verbalized understanding.

## 2015-10-29 NOTE — Telephone Encounter (Signed)
Please be certain that all medical records that are pertinent for this recent issue including lab work office visits and ultrasounds are sent to the specialists as previously discussed with Carollee Herter

## 2015-10-29 NOTE — Telephone Encounter (Signed)
Patient called stating that morning blood sugars have been running in the range of 170-175 in the morning since being off of the metformin. Patient would like to know what to do about elevated fasting blood sugars.Please advise?

## 2015-10-29 NOTE — Telephone Encounter (Signed)
To get a better picture and therefore to be able to give a more educated response to what would be the best medicine I asked that the patient check his sugars in the morning and again in the evening 2 hours after supper for the next couple days and call us on Friday with those readings then we can decide what next. I realize to that average person one would think any medication would help but it is not that simple therefore more readings would help [place]

## 2015-10-29 NOTE — Telephone Encounter (Signed)
FBS was 180 yesterday am. Calling to check on message

## 2015-10-29 NOTE — Telephone Encounter (Signed)
I need the paper chart please. 

## 2015-10-29 NOTE — Telephone Encounter (Signed)
Patient's labs, notes, and ultrasound results are being faxed over to [facility] office by our referral coordinator Brendale.

## 2015-11-09 ENCOUNTER — Other Ambulatory Visit: Payer: Self-pay | Admitting: Family Medicine

## 2015-11-18 ENCOUNTER — Other Ambulatory Visit: Payer: Self-pay | Admitting: Family Medicine

## 2015-11-19 ENCOUNTER — Ambulatory Visit: Payer: Self-pay | Admitting: Family Medicine

## 2015-12-01 ENCOUNTER — Other Ambulatory Visit: Payer: Self-pay | Admitting: Family Medicine

## 2015-12-16 ENCOUNTER — Other Ambulatory Visit: Payer: Self-pay | Admitting: Family Medicine

## 2016-02-14 ENCOUNTER — Other Ambulatory Visit: Payer: Self-pay | Admitting: Family Medicine

## 2016-03-26 ENCOUNTER — Other Ambulatory Visit: Payer: Self-pay | Admitting: Family Medicine

## 2016-04-01 ENCOUNTER — Other Ambulatory Visit: Payer: Self-pay | Admitting: Family Medicine

## 2016-04-27 ENCOUNTER — Telehealth: Payer: Self-pay | Admitting: Family Medicine

## 2016-04-27 ENCOUNTER — Other Ambulatory Visit: Payer: Self-pay | Admitting: Family Medicine

## 2016-04-27 DIAGNOSIS — E119 Type 2 diabetes mellitus without complications: Secondary | ICD-10-CM

## 2016-04-27 DIAGNOSIS — E785 Hyperlipidemia, unspecified: Secondary | ICD-10-CM

## 2016-04-27 NOTE — Telephone Encounter (Signed)
Pt has been checking his blood sugars for the couple days and they have been spiking in the low 200's. Pt is not currently on any medication and has a dot physical coming up in oct and is needing to get it under control by then. Please advise.

## 2016-04-27 NOTE — Telephone Encounter (Signed)
Several things a patient must do #1 low starch low sugar diet #2 make sure patient is getting at least 30-45 minutes of exercise in 5 days a week #3 patient should do lab work metabolic 7, lipid liver, hemoglobin A1c #4 patient should do a follow-up office visit within the first couple weeks of September. The patient can go ahead and get the lab work completed within the next week

## 2016-04-27 NOTE — Telephone Encounter (Signed)
Spoke with patient and informed him per Dr.Scott Luking- Several things a patient must do #1 low starch low sugar diet #2 make sure patient is getting at least 30-45 minutes of exercise in 5 days a week #3 patient should do lab work metabolic 7, lipid liver, hemoglobin A1c #4 patient should do a follow-up office visit within the first couple weeks of September. The patient can go ahead and get the lab work completed within the next week. Patient verbalized understanding. Labs ordered in epic.

## 2016-05-04 ENCOUNTER — Other Ambulatory Visit: Payer: Self-pay | Admitting: Family Medicine

## 2016-05-13 LAB — BASIC METABOLIC PANEL
BUN / CREAT RATIO: 21 — AB (ref 9–20)
BUN: 20 mg/dL (ref 6–24)
CHLORIDE: 98 mmol/L (ref 96–106)
CO2: 23 mmol/L (ref 18–29)
Calcium: 10.5 mg/dL — ABNORMAL HIGH (ref 8.7–10.2)
Creatinine, Ser: 0.96 mg/dL (ref 0.76–1.27)
GFR calc non Af Amer: 93 mL/min/{1.73_m2} (ref >59)
GFR, EST AFRICAN AMERICAN: 108 mL/min/{1.73_m2} (ref >59)
Glucose: 122 mg/dL — ABNORMAL HIGH (ref 65–99)
POTASSIUM: 4.5 mmol/L (ref 3.5–5.2)
Sodium: 139 mmol/L (ref 134–144)

## 2016-05-13 LAB — HEPATIC FUNCTION PANEL
ALBUMIN: 5.3 g/dL (ref 3.5–5.5)
ALK PHOS: 43 IU/L (ref 39–117)
ALT: 35 IU/L (ref 0–44)
AST: 28 IU/L (ref 0–40)
BILIRUBIN, DIRECT: 0.14 mg/dL (ref 0.00–0.40)
Bilirubin Total: 0.5 mg/dL (ref 0.0–1.2)
TOTAL PROTEIN: 8 g/dL (ref 6.0–8.5)

## 2016-05-13 LAB — LIPID PANEL
Chol/HDL Ratio: 5.5 ratio units — ABNORMAL HIGH (ref 0.0–5.0)
Cholesterol, Total: 160 mg/dL (ref 100–199)
HDL: 29 mg/dL — ABNORMAL LOW (ref >39)
LDL Calculated: 80 mg/dL (ref 0–99)
Triglycerides: 253 mg/dL — ABNORMAL HIGH (ref 0–149)
VLDL CHOLESTEROL CAL: 51 mg/dL — AB (ref 5–40)

## 2016-05-13 LAB — HEMOGLOBIN A1C
ESTIMATED AVERAGE GLUCOSE: 169 mg/dL
Hgb A1c MFr Bld: 7.5 % — ABNORMAL HIGH (ref 4.8–5.6)

## 2016-05-19 ENCOUNTER — Ambulatory Visit (INDEPENDENT_AMBULATORY_CARE_PROVIDER_SITE_OTHER): Payer: 59 | Admitting: Family Medicine

## 2016-05-19 ENCOUNTER — Encounter: Payer: Self-pay | Admitting: Family Medicine

## 2016-05-19 VITALS — BP 138/88 | Ht 68.0 in | Wt 185.0 lb

## 2016-05-19 DIAGNOSIS — E785 Hyperlipidemia, unspecified: Secondary | ICD-10-CM | POA: Diagnosis not present

## 2016-05-19 DIAGNOSIS — Z79899 Other long term (current) drug therapy: Secondary | ICD-10-CM | POA: Diagnosis not present

## 2016-05-19 DIAGNOSIS — R197 Diarrhea, unspecified: Secondary | ICD-10-CM | POA: Diagnosis not present

## 2016-05-19 DIAGNOSIS — E781 Pure hyperglyceridemia: Secondary | ICD-10-CM

## 2016-05-19 DIAGNOSIS — G4700 Insomnia, unspecified: Secondary | ICD-10-CM | POA: Diagnosis not present

## 2016-05-19 DIAGNOSIS — E119 Type 2 diabetes mellitus without complications: Secondary | ICD-10-CM | POA: Diagnosis not present

## 2016-05-19 MED ORDER — ALPRAZOLAM 0.5 MG PO TABS: 0.5000 mg | ORAL_TABLET | Freq: Every evening | ORAL | 0 refills | Status: DC | PRN

## 2016-05-19 MED ORDER — SITAGLIPTIN PHOSPHATE 100 MG PO TABS: 100.0000 mg | ORAL_TABLET | Freq: Every day | ORAL | 4 refills | Status: DC

## 2016-05-19 NOTE — Progress Notes (Signed)
   Subjective:    Patient ID: Gregory Thompson, male    DOB: 10/20/1965, [age] y.o.   MRN: [id number]  Diabetes  He presents for his follow-up diabetic visit. He has type 2 diabetes mellitus. Pertinent negatives for hypoglycemia include no confusion. Pertinent negatives for diabetes include no chest pain, no fatigue, no polydipsia, no polyphagia and no weakness. He is compliant with treatment all of the time. He is following a diabetic diet. Exercise: active job. (80 -150) He does not see a podiatrist.Eye exam is current.   A1C done on bloodwork 7 days ago.  Lab work was revealed reviewed with the patient. There is concern A1c higher than what it was rest of lab work looks good Patient with moderate insomnia due to stress has a hard time releasing his worries before he lays down to go to sleep he denies any other issues currently He has not been watching diet as well as he should denies numbness in the feet he still smokes he knows he needs to quit patient also denies any chest tightness pressure pain Only sleeps about 4 hours a night.    Review of Systems  Constitutional: Negative for activity change, appetite change and fatigue.  HENT: Negative for congestion.   Respiratory: Negative for cough.   Cardiovascular: Negative for chest pain.  Gastrointestinal: Negative for abdominal pain.  Endocrine: Negative for polydipsia and polyphagia.  Neurological: Negative for weakness.  Psychiatric/Behavioral: Negative for confusion.       Objective:   Physical Exam  Constitutional: He appears well-nourished. No distress.  Cardiovascular: Normal rate, regular rhythm and normal heart sounds.   No murmur heard. Pulmonary/Chest: Effort normal and breath sounds normal. No respiratory distress.  Musculoskeletal: He exhibits no edema.  Lymphadenopathy:    He has no cervical adenopathy.  Neurological: He is alert.  Psychiatric: His behavior is normal.  Vitals reviewed.   Patient was counseled to  quit smoking      Assessment & Plan:  Borderline blood pressure watch diet closely exercise try to lose weight Insomnia related to stress Xanax at nighttime not for long-term use one approximately 30 minutes before bedtime when necessary caution drowsiness should wear off by the time he gets up to go to work Diabetes good control in the past not so good currently start Januvia patient did not tolerate metformin Hyperlipidemia continue current medication Hypertriglyceridemia under decent control watch diet Recheck in a proximally 6 months do lab work before office visit  [place] recommended testing to rule out ciliary disease this was ordered for his next lab work

## 2016-05-25 ENCOUNTER — Other Ambulatory Visit: Payer: Self-pay | Admitting: Family Medicine

## 2016-06-03 ENCOUNTER — Other Ambulatory Visit: Payer: Self-pay | Admitting: Family Medicine

## 2016-06-06 ENCOUNTER — Other Ambulatory Visit: Payer: Self-pay | Admitting: Family Medicine

## 2016-06-17 ENCOUNTER — Other Ambulatory Visit: Payer: Self-pay | Admitting: Family Medicine

## 2016-06-18 NOTE — Telephone Encounter (Signed)
May have this +3 refills 

## 2016-07-20 ENCOUNTER — Telehealth: Payer: Self-pay | Admitting: Family Medicine

## 2016-07-20 NOTE — Telephone Encounter (Signed)
Patient states blood pressure is still running high 155/97. He wanted you to know.

## 2016-07-21 MED ORDER — LISINOPRIL 5 MG PO TABS: 5.0000 mg | ORAL_TABLET | Freq: Every day | ORAL | 3 refills | Status: DC

## 2016-07-21 NOTE — Telephone Encounter (Signed)
Spoke with patient informed her per Dr.Scott Luking- Dr.Scott would recommend adding lisinopril 5 mg 1 daily #30, 3 refills, and also recommend follow-up in January. Continue healthy eating. Try to avoid smoking. Minimize salt use. Stay physically active. If blood pressures continue to be elevated over the next 2-4 weeks of patient should notify us we may need to adjust medication. Patient verbalized understanding. Medication sent into pharmacy.

## 2016-07-21 NOTE — Telephone Encounter (Signed)
I would recommend adding lisinopril 5 mg 1 daily #30, 3 refills, and also recommend follow-up in January. Continue healthy eating. Try to avoid smoking. Minimize salt use. Stay physically active. If blood pressures continue to be elevated over the next 2-4 weeks of patient should notify us we may need to adjust medication

## 2016-08-10 ENCOUNTER — Emergency Department (HOSPITAL_COMMUNITY)
Admission: EM | Admit: 2016-08-10 | Discharge: 2016-08-10 | Disposition: A | Discharge status: Home / Self Care | Payer: 59 | Attending: Emergency Medicine | Admitting: Emergency Medicine

## 2016-08-10 ENCOUNTER — Encounter: Payer: Self-pay | Admitting: Family Medicine

## 2016-08-10 ENCOUNTER — Ambulatory Visit (INDEPENDENT_AMBULATORY_CARE_PROVIDER_SITE_OTHER): Payer: 59 | Admitting: Family Medicine

## 2016-08-10 ENCOUNTER — Telehealth: Payer: Self-pay | Admitting: Family Medicine

## 2016-08-10 ENCOUNTER — Encounter (HOSPITAL_COMMUNITY): Payer: Self-pay | Admitting: Emergency Medicine

## 2016-08-10 ENCOUNTER — Emergency Department (HOSPITAL_COMMUNITY)
Admission: EM | Admit: 2016-08-10 | Discharge: 2016-08-10 | Disposition: A | Discharge status: Home / Self Care | Payer: 59 | Source: Home / Self Care | Attending: Emergency Medicine | Admitting: Emergency Medicine

## 2016-08-10 ENCOUNTER — Emergency Department (HOSPITAL_COMMUNITY): Payer: 59

## 2016-08-10 VITALS — Ht 68.0 in | Wt 176.8 lb

## 2016-08-10 DIAGNOSIS — R109 Unspecified abdominal pain: Secondary | ICD-10-CM | POA: Diagnosis not present

## 2016-08-10 DIAGNOSIS — Z79899 Other long term (current) drug therapy: Secondary | ICD-10-CM | POA: Diagnosis not present

## 2016-08-10 DIAGNOSIS — I10 Essential (primary) hypertension: Secondary | ICD-10-CM | POA: Diagnosis not present

## 2016-08-10 DIAGNOSIS — E119 Type 2 diabetes mellitus without complications: Secondary | ICD-10-CM | POA: Insufficient documentation

## 2016-08-10 DIAGNOSIS — N12 Tubulo-interstitial nephritis, not specified as acute or chronic: Secondary | ICD-10-CM | POA: Insufficient documentation

## 2016-08-10 DIAGNOSIS — F17210 Nicotine dependence, cigarettes, uncomplicated: Secondary | ICD-10-CM | POA: Insufficient documentation

## 2016-08-10 DIAGNOSIS — Z7984 Long term (current) use of oral hypoglycemic drugs: Secondary | ICD-10-CM | POA: Insufficient documentation

## 2016-08-10 DIAGNOSIS — R1031 Right lower quadrant pain: Secondary | ICD-10-CM

## 2016-08-10 HISTORY — DX: Essential (primary) hypertension: I10

## 2016-08-10 LAB — URINALYSIS, ROUTINE W REFLEX MICROSCOPIC
Bilirubin Urine: NEGATIVE
Bilirubin Urine: NEGATIVE
GLUCOSE, UA: NEGATIVE mg/dL
Glucose, UA: 100 mg/dL — AB
HGB URINE DIPSTICK: NEGATIVE
HGB URINE DIPSTICK: NEGATIVE
Ketones, ur: NEGATIVE mg/dL
LEUKOCYTES UA: NEGATIVE
Leukocytes, UA: NEGATIVE
NITRITE: NEGATIVE
Nitrite: NEGATIVE
PH: 5.5 (ref 5.0–8.0)
PROTEIN: 30 mg/dL — AB
PROTEIN: NEGATIVE mg/dL
Specific Gravity, Urine: 1.025 (ref 1.005–1.030)
Specific Gravity, Urine: 1.015 (ref 1.005–1.030)
pH: 6 (ref 5.0–8.0)

## 2016-08-10 LAB — LIPASE, BLOOD: LIPASE: 24 U/L (ref 11–51)

## 2016-08-10 LAB — CBC
HEMATOCRIT: 45.5 % (ref 39.0–52.0)
HEMOGLOBIN: 16 g/dL (ref 13.0–17.0)
MCH: 30.2 pg (ref 26.0–34.0)
MCHC: 35.2 g/dL (ref 30.0–36.0)
MCV: 86 fL (ref 78.0–100.0)
Platelets: 262 10*3/uL (ref 150–400)
RBC: 5.29 MIL/uL (ref 4.22–5.81)
RDW: 13.8 % (ref 11.5–15.5)
WBC: 12.3 10*3/uL — ABNORMAL HIGH (ref 4.0–10.5)

## 2016-08-10 LAB — COMPREHENSIVE METABOLIC PANEL
ALBUMIN: 4.7 g/dL (ref 3.5–5.0)
ALT: 31 U/L (ref 17–63)
ANION GAP: 8 (ref 5–15)
AST: 23 U/L (ref 15–41)
Alkaline Phosphatase: 37 U/L — ABNORMAL LOW (ref 38–126)
BILIRUBIN TOTAL: 0.6 mg/dL (ref 0.3–1.2)
BUN: 11 mg/dL (ref 6–20)
CHLORIDE: 99 mmol/L — AB (ref 101–111)
CO2: 26 mmol/L (ref 22–32)
Calcium: 9.7 mg/dL (ref 8.9–10.3)
Creatinine, Ser: 0.75 mg/dL (ref 0.61–1.24)
GFR calc Af Amer: >60 mL/min (ref >60)
GFR calc non Af Amer: >60 mL/min (ref >60)
GLUCOSE: 111 mg/dL — AB (ref 65–99)
POTASSIUM: 3.6 mmol/L (ref 3.5–5.1)
SODIUM: 133 mmol/L — AB (ref 135–145)
Total Protein: 8.5 g/dL — ABNORMAL HIGH (ref 6.5–8.1)

## 2016-08-10 LAB — URINE MICROSCOPIC-ADD ON: RBC / HPF: NONE SEEN RBC/hpf (ref 0–5)

## 2016-08-10 MED ORDER — ONDANSETRON 4 MG PO TBDP
4.0000 mg | ORAL_TABLET | Freq: Once | ORAL | Status: AC
  Administered 2016-08-10: 4 mg via ORAL
  Filled 2016-08-10: qty 1

## 2016-08-10 MED ORDER — OXYCODONE-ACETAMINOPHEN 5-325 MG PO TABS
1.0000 | ORAL_TABLET | Freq: Once | ORAL | Status: AC
Start: 2016-08-10 — End: 2016-08-10
  Administered 2016-08-10: 1 via ORAL
  Filled 2016-08-10: qty 1

## 2016-08-10 MED ORDER — OXYCODONE-ACETAMINOPHEN 5-325 MG PO TABS: 1.0000 | ORAL_TABLET | Freq: Three times a day (TID) | ORAL | 0 refills | Status: DC | PRN

## 2016-08-10 MED ORDER — LEVOFLOXACIN 750 MG PO TABS: 750.0000 mg | ORAL_TABLET | Freq: Every day | ORAL | 0 refills | Status: DC

## 2016-08-10 MED ORDER — ONDANSETRON 4 MG PO TBDP: 4.0000 mg | ORAL_TABLET | Freq: Three times a day (TID) | ORAL | 0 refills | Status: DC | PRN

## 2016-08-10 NOTE — ED Triage Notes (Signed)
PT c/o right flank pain that started this am at 0300 with nausea and vomiting. PT denies any urinary symptoms and states history of kidney stones.

## 2016-08-10 NOTE — ED Notes (Signed)
Pt states understanding of care given and follow up instructions.  Pt A/O x4 ambulated from ED

## 2016-08-10 NOTE — Telephone Encounter (Signed)
ERROR

## 2016-08-10 NOTE — ED Triage Notes (Signed)
PT c/o RLQ abdominal pain radiating into his back that started this am. Pt sent over by Dr. Gerda DissLuking to rule out appendicitis  and report was given to Dr. Dianna LimboZackowoski today.

## 2016-08-10 NOTE — ED Notes (Signed)
Notified by registration that patient left.  

## 2016-08-10 NOTE — ED Provider Notes (Addendum)
[facility]-EMERGENCY DEPT Provider Note   CSN: [id number] Arrival date & time: 08/10/16  1754     History   Chief Complaint Chief Complaint  Patient presents with  . Abdominal Pain    HPI Gregory Thompson is a 50 y.o. male.  HPI Pt comes in with cc of back pain, groin pain. PT has hx of renal stones, liver cirrhosis, gall stones. He reports that the pain woke him up in the middle of the night. Pain is sharp, and started in the R  Posterior and lateral region - at the level of umbilicus. Pain is intermittent, and radiates to the groin. He has had kidney stones and the pain is similar. Pt has had associated nausea, chills. Pt denies any association of pain with food intake. He denies any diarrhea, bloody stools. Pt has some discomfort at the end of the urinary stream, but otherwise denies hematuria. Or urinary frequency.   Past Medical History:  Diagnosis Date  . Fatty liver   . Gout   . Hypertension   . Hypertriglyceridemia   . Kidney stone   . Liver cirrhosis (HCC) 1997   related to increased TG  . Prediabetes     Patient Active Problem List   Diagnosis Date Noted  . Type 2 diabetes mellitus (HCC) 03/29/2015  . Hyperglycemia 02/27/2014  . Hypertriglyceridemia 02/27/2014  . Irritable bowel syndrome 02/27/2014  . Hyperlipidemia 03/31/2013  . Gout 03/31/2013  . Tobacco abuse 03/31/2013  . GERD (gastroesophageal reflux disease) 03/24/2013    Past Surgical History:  Procedure Laterality Date  . LIVER BIOPSY    . VASECTOMY         Home Medications      Family History Family History  Problem Relation Age of Onset  . Hyperlipidemia Father   . Diabetes Paternal Grandfather     Social History Social History  Substance Use Topics  . Smoking status: Current Every Day Smoker    Packs/day: 1.00    Types: Cigarettes  . Smokeless tobacco: Never Used  . Alcohol use No     Allergies   Augmentin [amoxicillin-pot clavulanate]; Azithromycin; Cefzil [cefprozil];  and Viberzi [eluxadoline]   Review of Systems Review of Systems  ROS 10 Systems reviewed and are negative for acute change except as noted in the HPI.    Physical Exam Updated Vital Signs BP 134/89   Pulse 92   Temp 98.3 F (36.8 C) (Oral)   Resp 16   Ht 5\' 8"  (1.727 m)   Wt 176 lb (79.8 kg)   SpO2 96%   BMI 26.76 kg/m   Physical Exam  Constitutional: He is oriented to person, place, and time. He appears well-developed.  HENT:  Head: Atraumatic.  Neck: Neck supple.  Cardiovascular: Normal rate.   Pulmonary/Chest: Effort normal. No respiratory distress. He has no wheezes.  Abdominal: Soft. He exhibits no distension. There is no tenderness.  Genitourinary: Penis normal.  Genitourinary Comments: Testes free moving, no scrotal tenderness, mass or rash. No hernia  Neurological: He is alert and oriented to person, place, and time.  Skin: Skin is warm. Capillary refill takes less than 2 seconds.  Nursing note and vitals reviewed.    ED Treatments / Results  Labs (all labs ordered are listed, but only abnormal results are displayed) Labs Reviewed  COMPREHENSIVE METABOLIC PANEL - Abnormal; Notable for the following:       Result Value   Sodium 133 (*)    Chloride 99 (*)    Glucose,  Bld 111 (*)    Total Protein 8.5 (*)    Alkaline Phosphatase 37 (*)    All other components within normal limits  CBC - Abnormal; Notable for the following:    WBC 12.3 (*)    All other components within normal limits  URINALYSIS, ROUTINE W REFLEX MICROSCOPIC (NOT AT [facility]) - Abnormal; Notable for the following:    Glucose, UA 100 (*)    Ketones, ur TRACE (*)    All other components within normal limits  URINE CULTURE  LIPASE, BLOOD    EKG  EKG Interpretation None       Radiology Ct Renal Stone Study  Result Date: 08/10/2016 CLINICAL DATA:  50 y/o  M; right flank pain. EXAM: CT ABDOMEN AND PELVIS WITHOUT CONTRAST TECHNIQUE: Multidetector CT imaging of the abdomen and pelvis  was performed following the standard protocol without IV contrast. COMPARISON:  None. FINDINGS: Lower chest: No acute abnormality. Hepatobiliary: Hepatic steatosis. Multiple lucent gallstones. No intra or extrahepatic biliary ductal dilatation. Pancreas: Unremarkable. No pancreatic ductal dilatation or surrounding inflammatory changes. Spleen: Normal in size without focal abnormality. Adrenals/Urinary Tract: Adrenal glands are unremarkable. Kidneys are normal, without renal calculi, focal lesion, or hydronephrosis. Bladder is unremarkable. Stomach/Bowel: Stomach is within normal limits. Appendix appears normal. No evidence of bowel wall thickening, distention, or inflammatory changes. Vascular/Lymphatic: Aortic atherosclerosis. No enlarged abdominal or pelvic lymph nodes. Reproductive: Prostate is unremarkable. Other: No abdominal wall hernia or abnormality. No abdominopelvic ascites. Musculoskeletal: No acute osseous abnormality. Mild dextrocurvature of the lumbar spine with apex at L2. Lower lumbar facet arthrosis and discogenic degenerative changes greatest at L5-S1 where there is disc space narrowing and posterior marginal osteophytes narrowing the neural foramen. IMPRESSION: 1. No acute process identified. No nephrolithiasis or obstructive uropathy. Normal appendix. 2. Minimal aortic atherosclerosis. 3. Hepatic steatosis. 4. Cholelithiasis without findings for cholecystitis. 5. Degenerative changes of the spine greatest at L5-S1. Electronically Signed   By: Mitzi HansenLance  Furusawa-Stratton M.D.   On: 08/10/2016 19:36    Procedures Procedures (including critical care time)  Medications Ordered in ED Medications  ondansetron (ZOFRAN-ODT) disintegrating tablet 4 mg (4 mg Oral Given 08/10/16 2128)     Initial Impression / Assessment and Plan / ED Course  I have reviewed the triage vital signs and the nursing notes.  Pertinent labs & imaging results that were available during my care of the patient were  reviewed by me and considered in my medical decision making (see chart for details).  Clinical Course as of Aug 10 2210  Mon Aug 10, 2016  2211 Results from the ER workup discussed with the patient face to face and all questions answered to the best of my ability. Strict ER return precautions have been discussed, and patient is agreeing with the plan and is comfortable with the workup done and the recommendations from the ER.   [AN]    Clinical Course User Index [AN] Derwood KaplanAnkit Isaid Salvia, MD    Pt comes in with cc of intermittent sharp R sided pain. Pain is lateral, posterior and radiates to the groin area. Pt has some discomfort with urination. On exam he has no focal tenderness. Pt has gallstones, but the pain is not present in the RUQ.  CT renal stone from triage is negative for renal stones.  There is no physicial exam findings consistent with appendicitis and the CT shows normal appendix.  Clinical concerns are high for pyelonephritis - given the urinary symptoms + nausea + chills. Pt is diabetic. We  will give him levaquin.  Doubt acute thrombosis and organ infarct/necrosis given the intermittent nature of the pain. Testicular exam is also reassuring - no evidence of torsion.  Final Clinical Impressions(s) / ED Diagnoses   Final diagnoses:  Pyelonephritis    New Prescriptions New Prescriptions   LEVOFLOXACIN (LEVAQUIN) 750 MG TABLET    Take 1 tablet (750 mg total) by mouth daily.   ONDANSETRON (ZOFRAN ODT) 4 MG DISINTEGRATING TABLET    Take 1 tablet (4 mg total) by mouth every 8 (eight) hours as needed for nausea or vomiting.     Derwood Kaplan, MD 08/10/16 1610    Derwood Kaplan, MD 08/10/16 2232

## 2016-08-10 NOTE — Discharge Instructions (Addendum)
Take the meds as prescribed. The diagnosis is not very clear - but we suspect pyelonephritis. Please return to the ER if your symptoms worsen; you have increased pain, fevers, chills, inability to keep any medications down, confusion. Otherwise see the outpatient doctor as requested.

## 2016-08-10 NOTE — Progress Notes (Signed)
   Subjective:    Patient ID: Gregory Thompson, male    DOB: 09/17/1965, 50 y.o.   MRN: [id number]  HPI This very nice gentleman had onset of right side abdominal pain and discomfort that him at about 3 AM it is mainly in his side and in his flank it was fairly severe he's had intermittent vomiting throughout this morning into this afternoon he has reoccurring right side pain and pain into the groin region as well as right lower quadrant he also has pain when he lays flat straightening out his leg he denies high fever chills or sweats Patient arrives with c/o back pain that started at 3 am. Patient states the pain caused nausea and vomiting. Patient has a history of kidney stones. Patient also feels he may have pulled muscle in back vomiting.  Review of Systems See above denies cough wheezing difficulty breathing does relate vomiting right flank pain right lower abdominal pain right groin pain and leg pain    Objective:   Physical Exam Lungs are clear hearts regular abdomen is soft with some right lower quadrant tenderness increase leg pain when he's forced to lay flat with his legs stretched out flanks nontender to percussion urinalysis no WBCs or RBCs are seen The patient was checked into the ER to be evaluated but then his pain got better and he thought he could just go ahead and go home. Once the pain started reoccurring he came to the office to be seen.    Assessment & Plan:  Given his intermittent abdominal pain as well as vomiting that onset at 3 AM it is possible this could be a kidney stone without hematuria but given his pain into the leg with laying flat as well as the right lower abdomen be mildly tender raises the question of the possibility of appendicitis.  I believe the only way to get to the bottom of once: On his by having a CAT scan along with lab work. I spoke with the ER doctor they are kind enough to reevaluate him.

## 2016-08-12 ENCOUNTER — Telehealth: Payer: Self-pay | Admitting: Family Medicine

## 2016-08-12 LAB — URINE CULTURE: CULTURE: NO GROWTH

## 2016-08-12 NOTE — Telephone Encounter (Signed)
Patient transferred to front desk to schedule recheck for this week per Dr. Lorin PicketScott.

## 2016-08-12 NOTE — Telephone Encounter (Signed)
Pt called stating that his right thigh has a burning sensation. Pt states that he doesn't know where it is coming from or if he needs to be seen again. Please advise.

## 2016-08-13 ENCOUNTER — Encounter: Payer: Self-pay | Admitting: Family Medicine

## 2016-08-13 ENCOUNTER — Ambulatory Visit (INDEPENDENT_AMBULATORY_CARE_PROVIDER_SITE_OTHER): Payer: 59 | Admitting: Family Medicine

## 2016-08-13 VITALS — BP 118/86 | Ht 68.0 in | Wt 173.0 lb

## 2016-08-13 DIAGNOSIS — M5431 Sciatica, right side: Secondary | ICD-10-CM

## 2016-08-13 MED ORDER — OXYCODONE-ACETAMINOPHEN 10-325 MG PO TABS: 1.0000 | ORAL_TABLET | ORAL | 0 refills | Status: DC | PRN

## 2016-08-13 MED ORDER — PREDNISONE 20 MG PO TABS: ORAL_TABLET | ORAL | 0 refills | Status: DC

## 2016-08-13 MED ORDER — GABAPENTIN 100 MG PO CAPS: 100.0000 mg | ORAL_CAPSULE | Freq: Two times a day (BID) | ORAL | 3 refills | Status: DC

## 2016-08-13 NOTE — Progress Notes (Addendum)
   Subjective:    Patient ID: Gregory BalesStephen D Henigan, male    DOB: 12/09/1965, 50 y.o.   MRN: [id number]  Back Pain  This is a new problem. Episode onset: 2 days ago. The pain radiates to the right thigh. Treatments tried: tylenol, oxycodone. The treatment provided mild relief.  Patient relates a burning discomfort in his right lower back radiates around toward the groin but then down the right leg on anterior aspect states it feels like a sunburn no blistering no rash  Pt states bp med is not helping. Patient states his blood pressure seemed to be up but he thinks it may been because of pain   Review of Systems  Musculoskeletal: Positive for back pain.  Leg pain see above no nausea or vomiting currently no dysuria or urinary frequency     Objective:   Physical Exam No shingles rash seen lungs clear heart regular abdomen is soft no guarding rebound subjective low back discomfort with straight leg raise on the right reflexes good strength good  It should be noted that his blood pressure is good I reassured the patient he will follow-up with visit problem     Assessment & Plan:  Low back pain with sciatica I do not feel this is pyelonephritis may stop Levaquin. May use medicine for nausea. In addition to this I believe the patient would benefit from a short course of prednisone as well as gabapentin start off one per day then advanced 1 twice daily pain medication when necessary for severe pain do not drive with pain medicine  Follow-up in approximately 8 weeks stretching exercises sheet given patient should get gradual improvement over the course of next 8 weeks it progressively worse or not better may need MRI warning signs discussed.

## 2016-08-13 NOTE — Patient Instructions (Signed)
DASH Eating Plan DASH stands for "Dietary Approaches to Stop Hypertension." The DASH eating plan is a healthy eating plan that has been shown to reduce high blood pressure (hypertension). Additional health benefits may include reducing the risk of type 2 diabetes mellitus, heart disease, and stroke. The DASH eating plan may also help with weight loss. What do I need to know about the DASH eating plan? For the DASH eating plan, you will follow these general guidelines:  Choose foods with less than 150 milligrams of sodium per serving (as listed on the food label).  Use salt-free seasonings or herbs instead of table salt or sea salt.  Check with your health care provider or pharmacist before using salt substitutes.  Eat lower-sodium products. These are often labeled as "low-sodium" or "no salt added."  Eat fresh foods. Avoid eating a lot of canned foods.  Eat more vegetables, fruits, and low-fat dairy products.  Choose whole grains. Look for the word "whole" as the first word in the ingredient list.  Choose fish and skinless chicken or turkey more often than red meat. Limit fish, poultry, and meat to 6 oz (170 g) each day.  Limit sweets, desserts, sugars, and sugary drinks.  Choose heart-healthy fats.  Eat more home-cooked food and less restaurant, buffet, and fast food.  Limit fried foods.  Do not fry foods. Cook foods using methods such as baking, boiling, grilling, and broiling instead.  When eating at a restaurant, ask that your food be prepared with less salt, or no salt if possible. What foods can I eat? Seek help from a dietitian for individual calorie needs. Grains  Whole grain or whole wheat bread. Brown rice. Whole grain or whole wheat pasta. Quinoa, bulgur, and whole grain cereals. Low-sodium cereals. Corn or whole wheat flour tortillas. Whole grain cornbread. Whole grain crackers. Low-sodium crackers. Vegetables  Fresh or frozen vegetables (raw, steamed, roasted, or  grilled). Low-sodium or reduced-sodium tomato and vegetable juices. Low-sodium or reduced-sodium tomato sauce and paste. Low-sodium or reduced-sodium canned vegetables. Fruits  All fresh, canned (in natural juice), or frozen fruits. Meat and Other Protein Products  Ground beef (85% or leaner), grass-fed beef, or beef trimmed of fat. Skinless chicken or turkey. Ground chicken or turkey. Pork trimmed of fat. All fish and seafood. Eggs. Dried beans, peas, or lentils. Unsalted nuts and seeds. Unsalted canned beans. Dairy  Low-fat dairy products, such as skim or 1% milk, 2% or reduced-fat cheeses, low-fat ricotta or cottage cheese, or plain low-fat yogurt. Low-sodium or reduced-sodium cheeses. Fats and Oils  Tub margarines without trans fats. Light or reduced-fat mayonnaise and salad dressings (reduced sodium). Avocado. Safflower, olive, or canola oils. Natural peanut or almond butter. Other  Unsalted popcorn and pretzels. The items listed above may not be a complete list of recommended foods or beverages. Contact your dietitian for more options.  What foods are not recommended? Grains  White bread. White pasta. White rice. Refined cornbread. Bagels and croissants. Crackers that contain trans fat. Vegetables  Creamed or fried vegetables. Vegetables in a cheese sauce. Regular canned vegetables. Regular canned tomato sauce and paste. Regular tomato and vegetable juices. Fruits  Canned fruit in light or heavy syrup. Fruit juice. Meat and Other Protein Products  Fatty cuts of meat. Ribs, chicken wings, bacon, sausage, bologna, salami, chitterlings, fatback, hot dogs, bratwurst, and packaged luncheon meats. Salted nuts and seeds. Canned beans with salt. Dairy  Whole or 2% milk, cream, half-and-half, and cream cheese. Whole-fat or sweetened yogurt. Full-fat cheeses   or blue cheese. Nondairy creamers and whipped toppings. Processed cheese, cheese spreads, or cheese curds. Condiments  Onion and garlic  salt, seasoned salt, table salt, and sea salt. Canned and packaged gravies. Worcestershire sauce. Tartar sauce. Barbecue sauce. Teriyaki sauce. Soy sauce, including reduced sodium. Steak sauce. Fish sauce. Oyster sauce. Cocktail sauce. Horseradish. Ketchup and mustard. Meat flavorings and tenderizers. Bouillon cubes. Hot sauce. Tabasco sauce. Marinades. Taco seasonings. Relishes. Fats and Oils  Butter, stick margarine, lard, shortening, ghee, and bacon fat. Coconut, palm kernel, or palm oils. Regular salad dressings. Other  Pickles and olives. Salted popcorn and pretzels. The items listed above may not be a complete list of foods and beverages to avoid. Contact your dietitian for more information.  Where can I find more information? National Heart, Lung, and [employer] Institute: www.nhlbi.nih.gov/health/health-topics/topics/dash/ This information is not intended to replace advice given to you by your health care provider. Make sure you discuss any questions you have with your health care provider. Document Released: 08/13/2011 Document Revised: 01/30/2016 Document Reviewed: 06/28/2013 Elsevier Interactive Patient Education  2017 Elsevier Inc.  

## 2016-08-14 ENCOUNTER — Telehealth: Payer: Self-pay | Admitting: Family Medicine

## 2016-08-14 MED ORDER — NORTRIPTYLINE HCL 10 MG PO CAPS: ORAL_CAPSULE | ORAL | 0 refills | Status: DC

## 2016-08-14 NOTE — Telephone Encounter (Signed)
Spoke with patient and informed her per Dr.Scott Luking- #1 discontinue gabapentin No. 2-please per gabapentin on allergy list #3 nortriptyline 10 mg, this is a medication that in small doses can help with neuropathic pain in larger doses his antidepressant, #60, start off one nightly for the first 5 days then 2 per night from there on. The purpose of this medicine is to help with the neuropathic pain. If having any side effects with this it is best to stop it, and can cause some drowsiness but typically if taken at night it is tolerated well. Patient verbalized understanding.

## 2016-08-14 NOTE — Telephone Encounter (Signed)
#  1 discontinue gabapentin No. 2-please per gabapentin on allergy list #3 nortriptyline 10 mg, this is a medication that in small doses can help with neuropathic pain in larger doses his antidepressant, #60, start off one nightly for the first 5 days then 2 per night from there on. The purpose of this medicine is to help with the neuropathic pain. If having any side effects with this it is best to stop it, and can cause some drowsiness but typically if taken at night it is tolerated well

## 2016-08-14 NOTE — Telephone Encounter (Signed)
Patient was recently seen and prescribe gabapentin 100 mg he took the first one last night and began to itch all over. Can you prescribe something else .[facility]

## 2016-08-17 NOTE — Progress Notes (Signed)
Discussed with pt. Pt states he has not seen a rash and is doing some better. Will call back if rash develops.

## 2016-08-27 ENCOUNTER — Other Ambulatory Visit: Payer: Self-pay | Admitting: Family Medicine

## 2016-09-22 ENCOUNTER — Telehealth: Payer: Self-pay | Admitting: Family Medicine

## 2016-09-22 DIAGNOSIS — E781 Pure hyperglyceridemia: Secondary | ICD-10-CM

## 2016-09-22 DIAGNOSIS — E785 Hyperlipidemia, unspecified: Secondary | ICD-10-CM

## 2016-09-22 DIAGNOSIS — M1000 Idiopathic gout, unspecified site: Secondary | ICD-10-CM

## 2016-09-22 DIAGNOSIS — R739 Hyperglycemia, unspecified: Secondary | ICD-10-CM

## 2016-09-22 DIAGNOSIS — E119 Type 2 diabetes mellitus without complications: Secondary | ICD-10-CM

## 2016-09-22 NOTE — Telephone Encounter (Signed)
Requesting blood work to be ordered.  He would like to do this on the morning of 09/24/16.

## 2016-09-22 NOTE — Telephone Encounter (Signed)
Blood work ordered in EPIC. Patient notified. 

## 2016-09-22 NOTE — Telephone Encounter (Signed)
Lipid, liver, metabolic 7, uric acid, hemoglobin A1c, urine ACR

## 2016-09-24 ENCOUNTER — Ambulatory Visit: Payer: 59 | Admitting: Family Medicine

## 2016-09-24 ENCOUNTER — Telehealth: Payer: Self-pay | Admitting: Family Medicine

## 2016-09-24 ENCOUNTER — Other Ambulatory Visit: Payer: Self-pay | Admitting: Family Medicine

## 2016-09-24 MED ORDER — LOSARTAN POTASSIUM 50 MG PO TABS: 50.0000 mg | ORAL_TABLET | Freq: Every day | ORAL | 5 refills | Status: DC

## 2016-09-24 MED ORDER — GLIPIZIDE 5 MG PO TABS: ORAL_TABLET | ORAL | 5 refills | Status: DC

## 2016-09-24 MED ORDER — METFORMIN HCL ER 500 MG PO TB24: 500.0000 mg | ORAL_TABLET | Freq: Every day | ORAL | 5 refills | Status: DC

## 2016-09-24 NOTE — Telephone Encounter (Signed)
Due to poor weather office hours were canceled patient unable to reschedule therefore I had discussion with him over the phone-I had a long discussion with the patient. His morning sugars running in the 170s. The new medication Januvia not working out for him. He states he did tolerate metformin extended release 500 mg and he would like to reestablish this. Given his numbers he will also need some additional help. We discussed various pros and cons. In addition to this patient also states he's been having a frequent cough with lisinopril this is a common side effect we will stop lisinopril because he is diabetic we will use losartan 50 mg daily. I will go ahead and send in medication changes for him. Nurse's-please send in prescription for strips testing once daily with one year refill for diabetes. Also please mail him some glucose log sheets that he can record his readings on and send them back to us.(He will be doing some samples once daily sometimes in the morning sometimes later in the day he will record these then send him back to us he is aware of this-she will also do his lab work in the near future, he will also follow-up in several months)

## 2016-09-24 NOTE — Telephone Encounter (Signed)
Pt states that his sugars have been running in the 170s since he was put on the new BP medication. He is unable to schedule another appointment due to not know ing when he will gt a day off.

## 2016-09-25 ENCOUNTER — Other Ambulatory Visit: Payer: Self-pay | Admitting: Family Medicine

## 2016-09-25 MED ORDER — BLOOD GLUCOSE MONITORING SUPPL MISC: 11 refills | Status: DC

## 2016-09-25 NOTE — Addendum Note (Signed)
Addended by: Margaretha SheffieldBROWN, AUTUMN S on: 09/25/2016 11:20 AM   Modules accepted: Orders

## 2016-09-25 NOTE — Telephone Encounter (Signed)
He may have this plus four additional refills on all meds

## 2016-09-25 NOTE — Telephone Encounter (Signed)
Prescription for glucose testing strips sent electronically to pharmacy. Also glucose monitoring logs mailed to patient.

## 2016-10-03 ENCOUNTER — Other Ambulatory Visit: Payer: Self-pay | Admitting: Family Medicine

## 2016-11-17 ENCOUNTER — Other Ambulatory Visit: Payer: Self-pay | Admitting: Family Medicine

## 2016-12-11 ENCOUNTER — Other Ambulatory Visit: Payer: Self-pay | Admitting: Family Medicine

## 2016-12-11 LAB — HM DIABETES EYE EXAM

## 2016-12-16 ENCOUNTER — Other Ambulatory Visit: Payer: Self-pay | Admitting: Family Medicine

## 2016-12-20 ENCOUNTER — Other Ambulatory Visit: Payer: Self-pay | Admitting: Family Medicine

## 2016-12-24 ENCOUNTER — Encounter: Payer: Self-pay | Admitting: Family Medicine

## 2016-12-24 ENCOUNTER — Telehealth: Payer: Self-pay | Admitting: Family Medicine

## 2016-12-24 NOTE — Telephone Encounter (Signed)
A letter was dictated to the patient to remind him to do his labs in follow-up for office visit we will discuss his cholesterol and potentially stopping fenofibrate. Await the results of his labs and his office visit.

## 2017-01-13 ENCOUNTER — Encounter: Payer: Self-pay | Admitting: Family Medicine

## 2017-01-17 ENCOUNTER — Other Ambulatory Visit: Payer: Self-pay | Admitting: Family Medicine

## 2017-01-19 ENCOUNTER — Other Ambulatory Visit: Payer: Self-pay | Admitting: Family Medicine

## 2017-01-19 NOTE — Telephone Encounter (Signed)
One refill will need follow-up office visit

## 2017-01-31 IMAGING — CT CT RENAL STONE PROTOCOL
2 of 4 series · 16 of 46 positions shown, 18 images · non-contrast
Comparison: None.

CLINICAL DATA: 50 y/o  M; right flank pain.

EXAM:
CT ABDOMEN AND PELVIS WITHOUT CONTRAST
TECHNIQUE: Multidetector CT imaging of the abdomen and pelvis was performed
following the standard protocol without IV contrast.

[Series 2: axial st · axial · 0.77mm/px · z∈[+1018,+1468]mm · 13 of 98 slices shown, 15 images]
[im 4/98  soft-tissue]
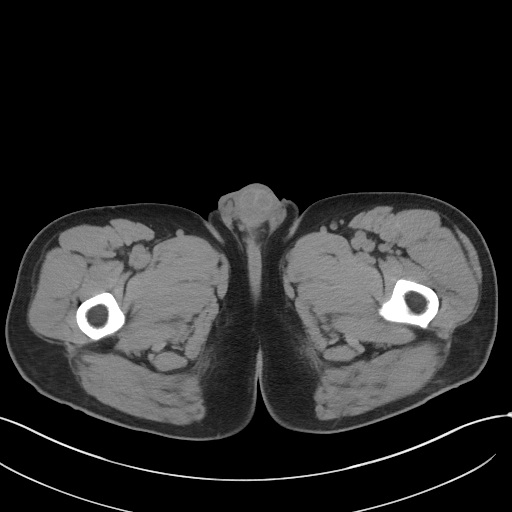
[im 4/98  bone]
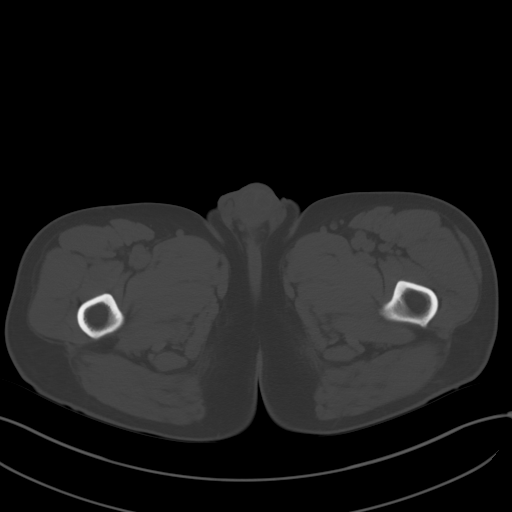
[im 12/98  soft-tissue]
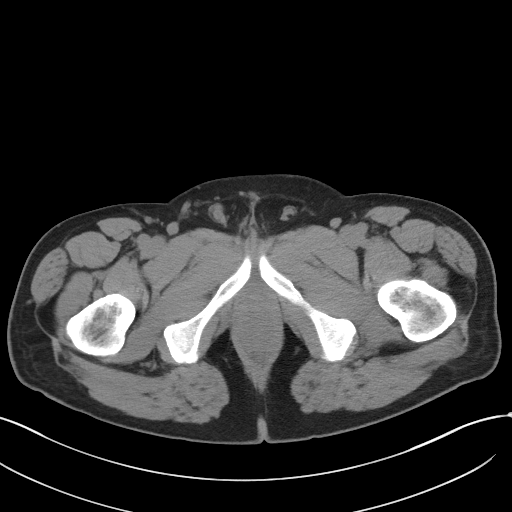
[im 20/98  soft-tissue]
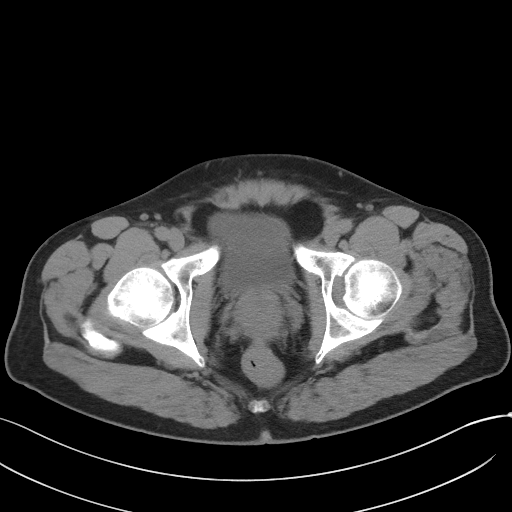
[im 28/98  soft-tissue]
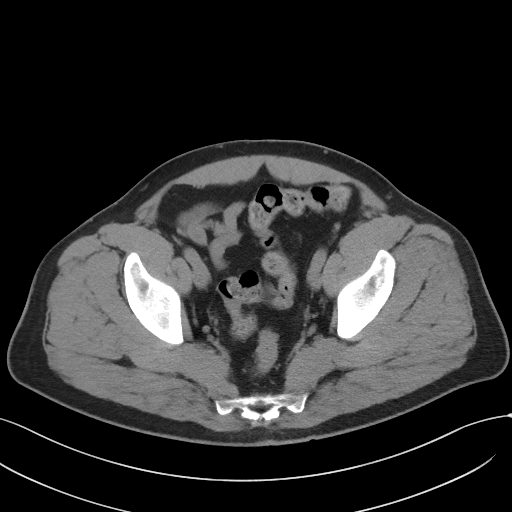
[im 35/98  soft-tissue]
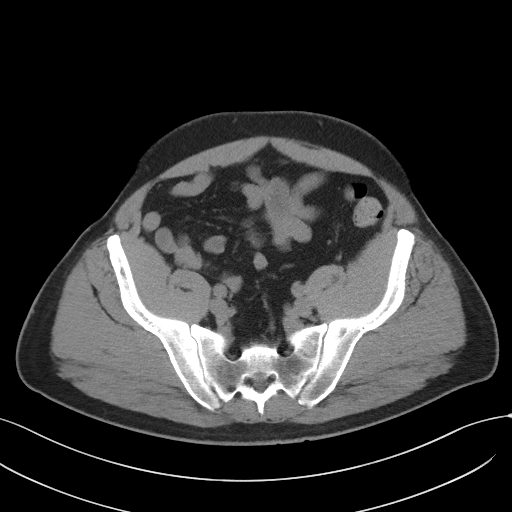
[im 43/98  soft-tissue]
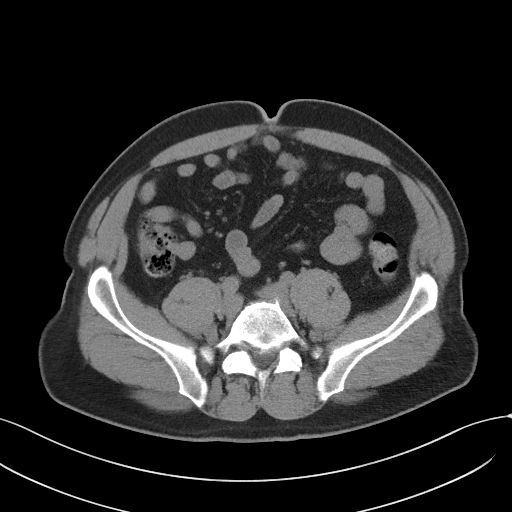
[im 51/98  soft-tissue]
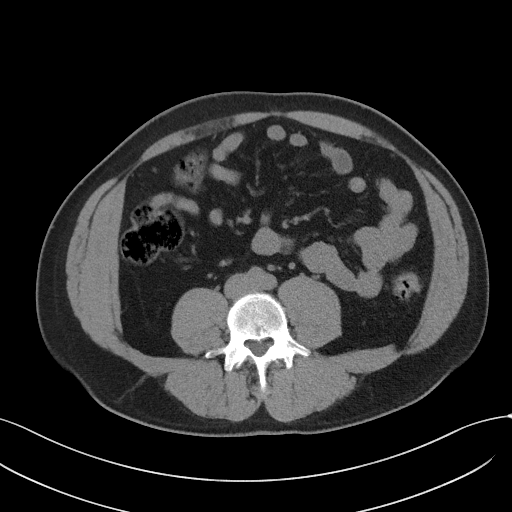
[im 55/98  soft-tissue]
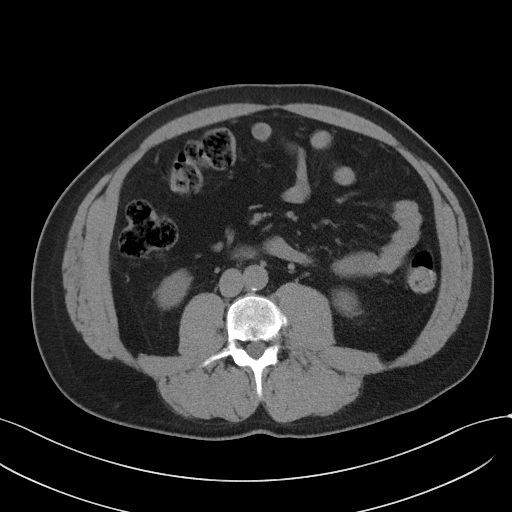
[im 63/98  soft-tissue]
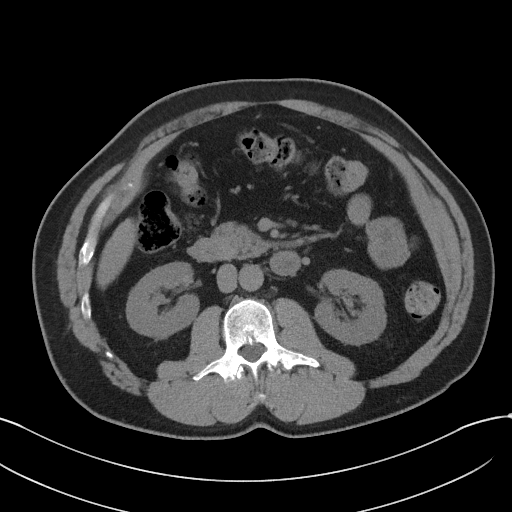
[im 63/98  bone]
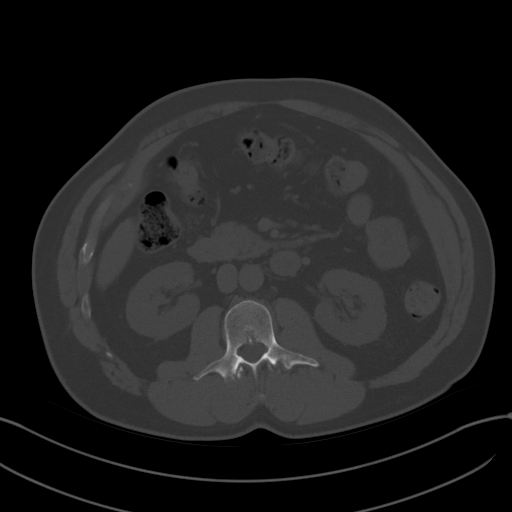
[im 70/98  soft-tissue]
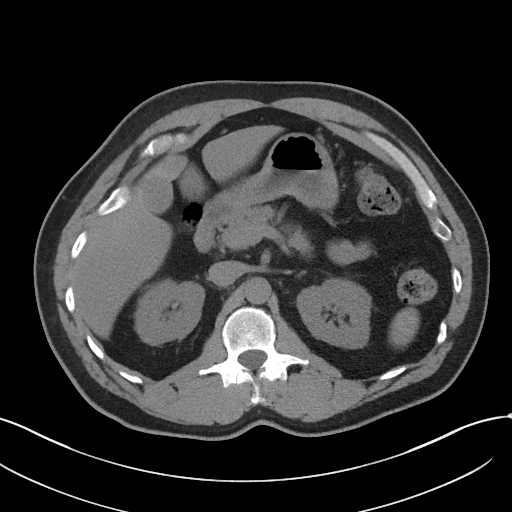
[im 78/98  soft-tissue]
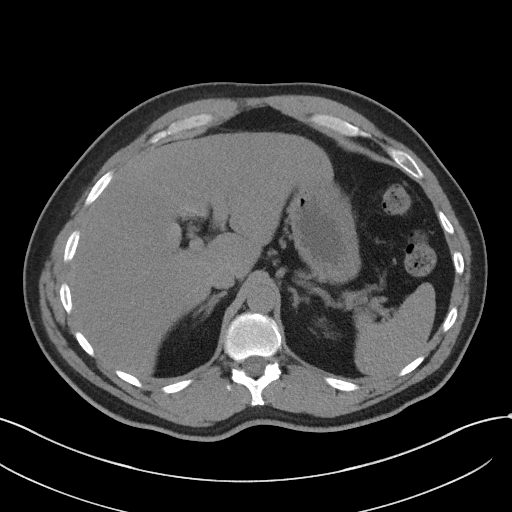
[im 86/98  soft-tissue]
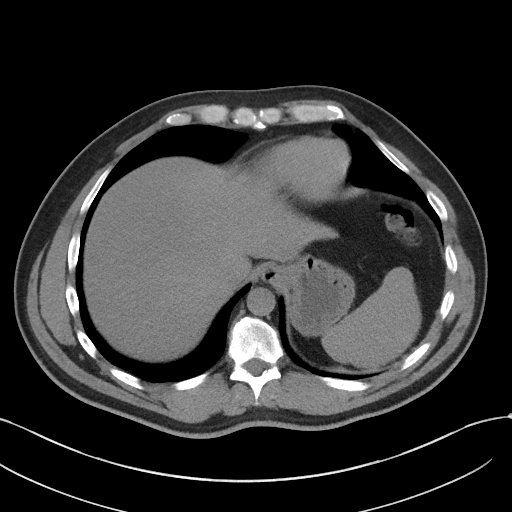
[im 94/98  soft-tissue]
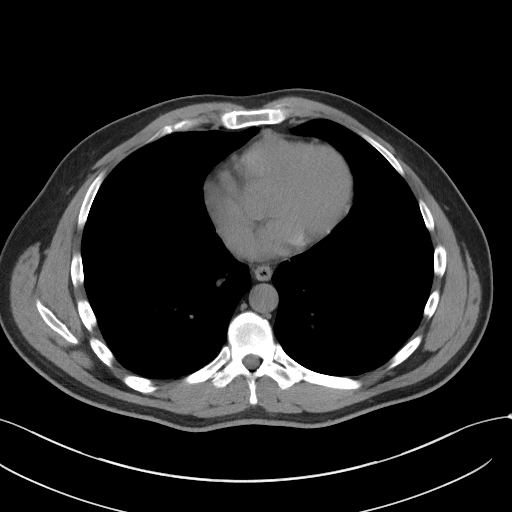

[Series 3: coronal st · coronal · 0.75mm/px · 3 of 94 slices shown]
[im 32/94  soft-tissue]
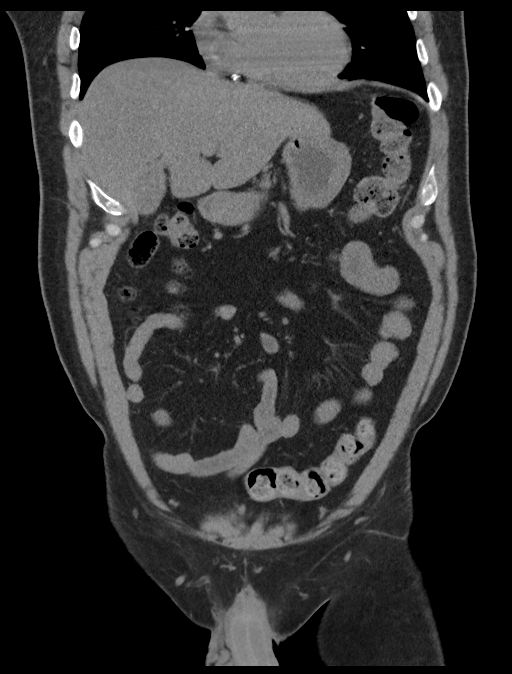
[im 42/94  soft-tissue]
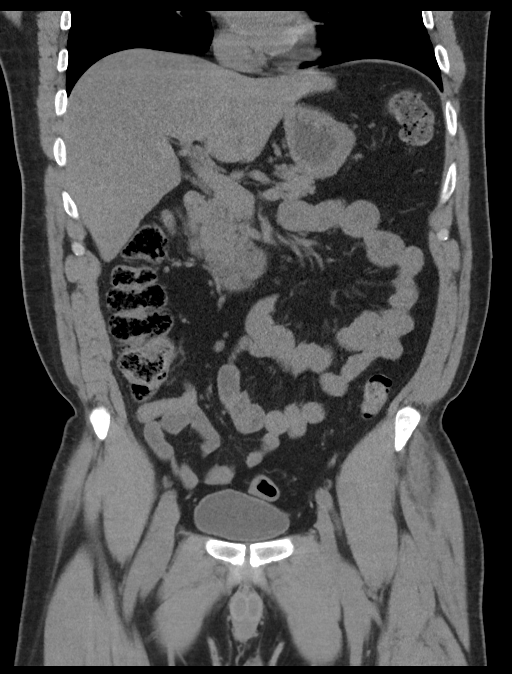
[im 52/94  soft-tissue]
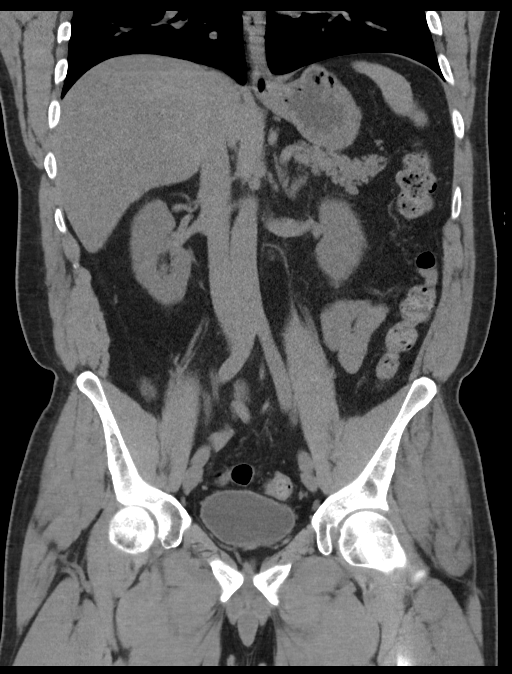

[16 of 46 positions shown; findings below may reference images not displayed]

FINDINGS: Lower chest: No acute abnormality.

Hepatobiliary: Hepatic steatosis. Multiple lucent gallstones. No
intra or extrahepatic biliary ductal dilatation.

Pancreas: Unremarkable. No pancreatic ductal dilatation or
surrounding inflammatory changes.

Spleen: Normal in size without focal abnormality.

Adrenals/Urinary Tract: Adrenal glands are unremarkable. Kidneys are
normal, without renal calculi, focal lesion, or hydronephrosis.
Bladder is unremarkable.

Stomach/Bowel: Stomach is within normal limits. Appendix appears
normal. No evidence of bowel wall thickening, distention, or
inflammatory changes.

Vascular/Lymphatic: Aortic atherosclerosis. No enlarged abdominal or
pelvic lymph nodes.

Reproductive: Prostate is unremarkable.

Other: No abdominal wall hernia or abnormality. No abdominopelvic
ascites.

Musculoskeletal: No acute osseous abnormality. Mild dextrocurvature
of the lumbar spine with apex at L2. Lower lumbar facet arthrosis
and discogenic degenerative changes greatest at L5-S1 where there is
disc space narrowing and posterior marginal osteophytes narrowing
the neural foramen.
IMPRESSION: 1. No acute process identified. No nephrolithiasis or obstructive
uropathy. Normal appendix.
2. Minimal aortic atherosclerosis.
3. Hepatic steatosis.
4. Cholelithiasis without findings for cholecystitis.
5. Degenerative changes of the spine greatest at L5-S1.

By: Israel David Cisterna M.D.

## 2017-02-16 ENCOUNTER — Other Ambulatory Visit: Payer: Self-pay | Admitting: Family Medicine

## 2017-02-16 NOTE — Telephone Encounter (Signed)
Last seen 08/10/16

## 2017-02-17 NOTE — Telephone Encounter (Signed)
He may have one refill, needs office visit no further refills still office

## 2017-02-18 ENCOUNTER — Other Ambulatory Visit: Payer: Self-pay | Admitting: Family Medicine

## 2017-02-19 ENCOUNTER — Ambulatory Visit (INDEPENDENT_AMBULATORY_CARE_PROVIDER_SITE_OTHER): Payer: 59 | Admitting: Family Medicine

## 2017-02-19 ENCOUNTER — Encounter: Payer: Self-pay | Admitting: Family Medicine

## 2017-02-19 VITALS — BP 130/90 | Ht 68.0 in | Wt 181.5 lb

## 2017-02-19 DIAGNOSIS — E119 Type 2 diabetes mellitus without complications: Secondary | ICD-10-CM

## 2017-02-19 DIAGNOSIS — Z720 Tobacco use: Secondary | ICD-10-CM

## 2017-02-19 DIAGNOSIS — E785 Hyperlipidemia, unspecified: Secondary | ICD-10-CM | POA: Diagnosis not present

## 2017-02-19 DIAGNOSIS — K580 Irritable bowel syndrome with diarrhea: Secondary | ICD-10-CM | POA: Diagnosis not present

## 2017-02-19 DIAGNOSIS — K219 Gastro-esophageal reflux disease without esophagitis: Secondary | ICD-10-CM | POA: Diagnosis not present

## 2017-02-19 LAB — POCT GLYCOSYLATED HEMOGLOBIN (HGB A1C): HEMOGLOBIN A1C: 5.9

## 2017-02-19 MED ORDER — BUPROPION HCL ER (SR) 150 MG PO TB12: 150.0000 mg | ORAL_TABLET | Freq: Two times a day (BID) | ORAL | 5 refills | Status: DC

## 2017-02-19 MED ORDER — ALPRAZOLAM 0.5 MG PO TABS: ORAL_TABLET | ORAL | 5 refills | Status: DC

## 2017-02-19 NOTE — Progress Notes (Signed)
   Subjective:    Patient ID: Gregory Thompson, male    DOB: 02/11/1966, 51 y.o.   MRN: [id number]  Diabetes  He presents for his follow-up diabetic visit. He has type 2 diabetes mellitus. No MedicAlert identification noted. He does not see a podiatrist.Eye exam is current.  He states he does try to watch his diet for the most part but he works a lot of hours which makes it difficult He does not exercise on a regular basis because of his work schedule He does smoke he knows he needs to quit He is hopeful that the medication could help him quit He does take his blood pressure medicine tries to minimize salt in the diet He does take his medicine for triglycerides as well Patient does take his acid blocker reflux under good control Irritable bowel does give him some trouble occasionally has to take antidiarrheal medicine Denies any gout flareups He does state he is feeling somewhat down but denies being suicidal. He is under a lot of stress with some family issues  States no other concerns this visit.   Results for orders placed or performed in visit on 02/19/17  POCT HgB A1C  Result Value Ref Range   Hemoglobin A1C 5.9     Review of Systems Denies chest tightness pressure pain shortness breath nausea vomiting diarrhea excessive thirst or urination    Objective:   Physical Exam  Neck no masses lungs clear no crackles heart regular no murmurs pulse normal skin warm dry abdomen soft Extremities no edema     Assessment & Plan:  Diabetes-overall good control continue current measures watch diet closely stay physically active  Blood pressure good control continue current measures. Minimize salt diet  Reflux under good control continue PPI  Patient is a smoker reason counseled to quit smoking he has chosen to try Wellbutrin 150 mg SR twice daily he also has some mild depression symptoms but not suicidal this should help both-he was told that if his symptoms get worse he needs  follow-up right away otherwise follow-up in a few months. Patient was offered counseling does not one to do counseling.  Hyperlipidemia continue pravastatin previous labs reviewed Labs ordered  Patient is to follow-up in approximately 4-6 months with lab work before his next visit

## 2017-02-20 LAB — LIPID PANEL
CHOL/HDL RATIO: 5.2 ratio — AB (ref 0.0–5.0)
CHOLESTEROL TOTAL: 166 mg/dL (ref 100–199)
HDL: 32 mg/dL — ABNORMAL LOW (ref >39)
LDL CALC: 103 mg/dL — AB (ref 0–99)
Triglycerides: 154 mg/dL — ABNORMAL HIGH (ref 0–149)
VLDL Cholesterol Cal: 31 mg/dL (ref 5–40)

## 2017-02-20 LAB — HEPATIC FUNCTION PANEL
ALT: 25 IU/L (ref 0–44)
AST: 23 IU/L (ref 0–40)
Albumin: 5.1 g/dL (ref 3.5–5.5)
Alkaline Phosphatase: 39 IU/L (ref 39–117)
BILIRUBIN TOTAL: 0.5 mg/dL (ref 0.0–1.2)
Bilirubin, Direct: 0.13 mg/dL (ref 0.00–0.40)
Total Protein: 7.6 g/dL (ref 6.0–8.5)

## 2017-02-20 LAB — CBC WITH DIFFERENTIAL/PLATELET
BASOS: 2 %
Basophils Absolute: 0.1 10*3/uL (ref 0.0–0.2)
EOS (ABSOLUTE): 0.6 10*3/uL — ABNORMAL HIGH (ref 0.0–0.4)
EOS: 10 %
HEMOGLOBIN: 14.9 g/dL (ref 13.0–17.7)
Hematocrit: 42.8 % (ref 37.5–51.0)
IMMATURE GRANS (ABS): 0 10*3/uL (ref 0.0–0.1)
Immature Granulocytes: 0 %
LYMPHS: 30 %
Lymphocytes Absolute: 1.9 10*3/uL (ref 0.7–3.1)
MCH: 30.3 pg (ref 26.6–33.0)
MCHC: 34.8 g/dL (ref 31.5–35.7)
MCV: 87 fL (ref 79–97)
MONOCYTES: 8 %
Monocytes Absolute: 0.5 10*3/uL (ref 0.1–0.9)
NEUTROS ABS: 3.2 10*3/uL (ref 1.4–7.0)
Neutrophils: 50 %
Platelets: 229 10*3/uL (ref 150–379)
RBC: 4.91 x10E6/uL (ref 4.14–5.80)
RDW: 14.5 % (ref 12.3–15.4)
WBC: 6.3 10*3/uL (ref 3.4–10.8)

## 2017-02-20 LAB — BASIC METABOLIC PANEL
BUN / CREAT RATIO: 13 (ref 9–20)
BUN: 14 mg/dL (ref 6–24)
CO2: 24 mmol/L (ref 20–29)
CREATININE: 1.08 mg/dL (ref 0.76–1.27)
Calcium: 10.1 mg/dL (ref 8.7–10.2)
Chloride: 101 mmol/L (ref 96–106)
GFR calc Af Amer: 93 mL/min/{1.73_m2} (ref >59)
GFR calc non Af Amer: 80 mL/min/{1.73_m2} (ref >59)
GLUCOSE: 95 mg/dL (ref 65–99)
Potassium: 4.4 mmol/L (ref 3.5–5.2)
SODIUM: 143 mmol/L (ref 134–144)

## 2017-02-20 LAB — MICROALBUMIN / CREATININE URINE RATIO
Creatinine, Urine: 241.8 mg/dL
Microalb/Creat Ratio: 19 mg/g creat (ref 0.0–30.0)
Microalbumin, Urine: 46 ug/mL

## 2017-02-22 MED ORDER — PRAVASTATIN SODIUM 40 MG PO TABS: 40.0000 mg | ORAL_TABLET | Freq: Every day | ORAL | 5 refills | Status: DC

## 2017-02-22 MED ORDER — GEMFIBROZIL 600 MG PO TABS: ORAL_TABLET | ORAL | 4 refills | Status: DC

## 2017-02-22 NOTE — Addendum Note (Signed)
Addended by: Theodora BlowREWS, Cloys Vera R on: 02/22/2017 11:44 AM   Modules accepted: Orders

## 2017-03-08 ENCOUNTER — Other Ambulatory Visit: Payer: Self-pay | Admitting: Family Medicine

## 2017-03-17 ENCOUNTER — Other Ambulatory Visit: Payer: Self-pay | Admitting: Family Medicine

## 2017-03-18 ENCOUNTER — Other Ambulatory Visit: Payer: Self-pay

## 2017-03-18 MED ORDER — PANTOPRAZOLE SODIUM 40 MG PO TBEC: 40.0000 mg | DELAYED_RELEASE_TABLET | Freq: Two times a day (BID) | ORAL | 5 refills | Status: DC

## 2017-03-18 MED ORDER — PRAVASTATIN SODIUM 40 MG PO TABS: 40.0000 mg | ORAL_TABLET | Freq: Every day | ORAL | 5 refills | Status: DC

## 2017-03-19 ENCOUNTER — Other Ambulatory Visit: Payer: Self-pay | Admitting: Family Medicine

## 2017-04-25 ENCOUNTER — Other Ambulatory Visit: Payer: Self-pay | Admitting: Family Medicine

## 2017-04-26 ENCOUNTER — Telehealth: Payer: Self-pay | Admitting: Family Medicine

## 2017-04-26 DIAGNOSIS — E785 Hyperlipidemia, unspecified: Secondary | ICD-10-CM

## 2017-04-26 DIAGNOSIS — E119 Type 2 diabetes mellitus without complications: Secondary | ICD-10-CM

## 2017-04-26 NOTE — Telephone Encounter (Signed)
Lipid, liver, metabolic 7, A1c 

## 2017-04-26 NOTE — Telephone Encounter (Signed)
Patient is requesting to get his labs done and to check his A1C. He would like to get them done on 9/4 .

## 2017-04-26 NOTE — Telephone Encounter (Signed)
Spoke with patient and informed him per Dr.Scott Luking- labs have been ordered. Patient verbalized understanding.  

## 2017-05-12 LAB — HEPATIC FUNCTION PANEL
ALT: 35 IU/L (ref 0–44)
AST: 27 IU/L (ref 0–40)
Albumin: 4.5 g/dL (ref 3.5–5.5)
Alkaline Phosphatase: 41 IU/L (ref 39–117)
BILIRUBIN TOTAL: 0.3 mg/dL (ref 0.0–1.2)
BILIRUBIN, DIRECT: 0.12 mg/dL (ref 0.00–0.40)
TOTAL PROTEIN: 7.4 g/dL (ref 6.0–8.5)

## 2017-05-12 LAB — BASIC METABOLIC PANEL
BUN/Creatinine Ratio: 11 (ref 9–20)
BUN: 12 mg/dL (ref 6–24)
CALCIUM: 9.4 mg/dL (ref 8.7–10.2)
CO2: 25 mmol/L (ref 20–29)
CREATININE: 1.05 mg/dL (ref 0.76–1.27)
Chloride: 101 mmol/L (ref 96–106)
GFR calc Af Amer: 96 mL/min/{1.73_m2} (ref >59)
GFR calc non Af Amer: 83 mL/min/{1.73_m2} (ref >59)
GLUCOSE: 104 mg/dL — AB (ref 65–99)
Potassium: 4.2 mmol/L (ref 3.5–5.2)
SODIUM: 139 mmol/L (ref 134–144)

## 2017-05-12 LAB — LIPID PANEL
CHOL/HDL RATIO: 5.5 ratio — AB (ref 0.0–5.0)
Cholesterol, Total: 154 mg/dL (ref 100–199)
HDL: 28 mg/dL — ABNORMAL LOW (ref >39)
LDL CALC: 58 mg/dL (ref 0–99)
Triglycerides: 340 mg/dL — ABNORMAL HIGH (ref 0–149)
VLDL Cholesterol Cal: 68 mg/dL — ABNORMAL HIGH (ref 5–40)

## 2017-05-12 LAB — HEMOGLOBIN A1C
Est. average glucose Bld gHb Est-mCnc: 131 mg/dL
Hgb A1c MFr Bld: 6.2 % — ABNORMAL HIGH (ref 4.8–5.6)

## 2017-05-28 ENCOUNTER — Telehealth: Payer: Self-pay | Admitting: Family Medicine

## 2017-05-28 MED ORDER — HYOSCYAMINE SULFATE ER 0.375 MG PO TB12: 0.3750 mg | ORAL_TABLET | Freq: Two times a day (BID) | ORAL | 5 refills | Status: DC | PRN

## 2017-05-28 MED ORDER — HYOSCYAMINE SULFATE ER 0.375 MG PO TB12
0.3750 mg | ORAL_TABLET | Freq: Two times a day (BID) | ORAL | 5 refills | Status: DC | PRN
Start: 2017-05-28 — End: 2018-01-28

## 2017-05-28 NOTE — Telephone Encounter (Signed)
Patient has been having a lot of stomach cramping lately.  He said that he thinks that the Bentyl needs to be changed to hyoscyamine.  [facility]

## 2017-05-28 NOTE — Telephone Encounter (Signed)
levbid 60 one bid prn five ref

## 2017-05-28 NOTE — Telephone Encounter (Signed)
Spoke with patient and informed him per Dr.Steve Luking- [facility] [place]. Patient verbalized understanding.

## 2017-07-02 ENCOUNTER — Encounter: Payer: Self-pay | Admitting: Family Medicine

## 2017-07-02 ENCOUNTER — Ambulatory Visit (INDEPENDENT_AMBULATORY_CARE_PROVIDER_SITE_OTHER): Payer: 59 | Admitting: Family Medicine

## 2017-07-02 VITALS — BP 130/80 | Ht 68.0 in | Wt 188.0 lb

## 2017-07-02 DIAGNOSIS — E785 Hyperlipidemia, unspecified: Secondary | ICD-10-CM

## 2017-07-02 DIAGNOSIS — E119 Type 2 diabetes mellitus without complications: Secondary | ICD-10-CM | POA: Diagnosis not present

## 2017-07-02 DIAGNOSIS — I10 Essential (primary) hypertension: Secondary | ICD-10-CM | POA: Diagnosis not present

## 2017-07-02 DIAGNOSIS — G4700 Insomnia, unspecified: Secondary | ICD-10-CM

## 2017-07-02 DIAGNOSIS — J019 Acute sinusitis, unspecified: Secondary | ICD-10-CM | POA: Diagnosis not present

## 2017-07-02 MED ORDER — SERTRALINE HCL 50 MG PO TABS: ORAL_TABLET | ORAL | 3 refills | Status: DC

## 2017-07-02 MED ORDER — GEMFIBROZIL 600 MG PO TABS: ORAL_TABLET | ORAL | 4 refills | Status: DC

## 2017-07-02 MED ORDER — GLIPIZIDE 5 MG PO TABS: ORAL_TABLET | ORAL | 5 refills | Status: DC

## 2017-07-02 MED ORDER — METFORMIN HCL ER 500 MG PO TB24: 500.0000 mg | ORAL_TABLET | Freq: Every day | ORAL | 5 refills | Status: DC

## 2017-07-02 MED ORDER — LEVOFLOXACIN 500 MG PO TABS: 500.0000 mg | ORAL_TABLET | Freq: Every day | ORAL | 0 refills | Status: DC

## 2017-07-02 MED ORDER — ALPRAZOLAM 0.5 MG PO TABS: ORAL_TABLET | ORAL | 5 refills | Status: DC

## 2017-07-02 MED ORDER — PRAVASTATIN SODIUM 40 MG PO TABS: 40.0000 mg | ORAL_TABLET | Freq: Every day | ORAL | 5 refills | Status: DC

## 2017-07-02 NOTE — Progress Notes (Signed)
   Subjective:    Patient ID: Gregory Thompson, male    DOB: 12/14/1965, 51 y.o.   MRN: [id number]  Diabetes  He presents for his follow-up diabetic visit. He has type 2 diabetes mellitus. Pertinent negatives for hypoglycemia include no confusion. Pertinent negatives for diabetes include no chest pain, no fatigue, no polydipsia, no polyphagia and no weakness. He is compliant with treatment all of the time. Home blood sugar record trend: around 130. He does not see a podiatrist.Eye exam is current.   BP has been high.  He relates blood pressures been running higher than what he would expect he has been under a lot of stress.  Still smokes at least a pack a day he knows he needs to quit.  Denies shortness of breath chest pressure tightness or pain  Denies low sugar spells relates he takes his medicine.  Tries to watch diet  Takes his cholesterol medicine denies problem with has history of severely elevated triglycerides and LDL  Denies being depressed but is very stressed at home causing some difficulty with how he tolerates everything at home denies being suicidal  Declines flu vaccine.    Review of Systems  Constitutional: Negative for activity change, appetite change and fatigue.  HENT: Negative for congestion.   Respiratory: Negative for cough.   Cardiovascular: Negative for chest pain.  Gastrointestinal: Negative for abdominal pain.  Endocrine: Negative for polydipsia and polyphagia.  Neurological: Negative for weakness.  Psychiatric/Behavioral: Negative for confusion.       Objective:   Physical Exam  Constitutional: He appears well-nourished. No distress.  Cardiovascular: Normal rate, regular rhythm and normal heart sounds.   No murmur heard. Pulmonary/Chest: Effort normal and breath sounds normal. No respiratory distress.  Musculoskeletal: He exhibits no edema.  Lymphadenopathy:    He has no cervical adenopathy.  Neurological: He is alert.  Psychiatric: His behavior is  normal.  Vitals reviewed.   25 minutes was spent with the patient. Greater than half the time was spent in discussion and answering questions and counseling regarding the issues that the patient came in for today.       Assessment & Plan:  Diabetes under good control.  I did discuss with him watching his sugars closely if he has low sugar spells he needs to let us know  Hyperlipidemia recent lab work looked good but triglycerides elevated we are using the pravastatin along with one gemfibrozil.  Ideally we would like to get away from this but his triglycerides in the past have gone up to 1000 and patient states that it caused some liver dysfunction  Hypertension blood pressure on recheck good continue current measures  Insomnia uses Xanax at nighttime to help him sleep he is aware not to drive with that  Sinus infection antibiotics prescribed should gradually get better  Smoker he was encouraged to quit smoking  Mild stress related issues after long discussion patient does consent to trying Zoloft at a low dose to see if it will help follow-up if not improving over the next 4 weeks  Recheck here in approximately 5-6 months sooner problems  Mild erectile dysfunction he would like to hold off on generic sildenafil currently

## 2017-07-06 ENCOUNTER — Other Ambulatory Visit: Payer: Self-pay | Admitting: *Deleted

## 2017-07-06 MED ORDER — GLIPIZIDE 5 MG PO TABS: ORAL_TABLET | ORAL | 5 refills | Status: DC

## 2017-09-04 ENCOUNTER — Other Ambulatory Visit: Payer: Self-pay | Admitting: Family Medicine

## 2017-09-21 ENCOUNTER — Other Ambulatory Visit: Payer: Self-pay | Admitting: Family Medicine

## 2017-10-20 ENCOUNTER — Other Ambulatory Visit: Payer: Self-pay | Admitting: Family Medicine

## 2017-11-25 ENCOUNTER — Ambulatory Visit: Payer: 59 | Admitting: Family Medicine

## 2017-12-03 ENCOUNTER — Other Ambulatory Visit: Payer: Self-pay | Admitting: Family Medicine

## 2017-12-13 ENCOUNTER — Telehealth: Payer: Self-pay

## 2017-12-13 NOTE — Telephone Encounter (Signed)
Patient is requesting sertraline 50 mg 1/2 to 1 daily # 90 [facility]. Last seen 07/02/2017 Med check

## 2017-12-13 NOTE — Telephone Encounter (Signed)
Needs lipid liv met7 PSA A1C urine ACR and HIV ab, may have 1 refill, do labs and follow up OV

## 2017-12-14 ENCOUNTER — Other Ambulatory Visit: Payer: Self-pay

## 2017-12-14 ENCOUNTER — Other Ambulatory Visit: Payer: Self-pay | Admitting: Family Medicine

## 2017-12-14 ENCOUNTER — Telehealth: Payer: Self-pay | Admitting: Family Medicine

## 2017-12-14 DIAGNOSIS — E118 Type 2 diabetes mellitus with unspecified complications: Secondary | ICD-10-CM

## 2017-12-14 DIAGNOSIS — Z125 Encounter for screening for malignant neoplasm of prostate: Secondary | ICD-10-CM

## 2017-12-14 DIAGNOSIS — Z114 Encounter for screening for human immunodeficiency virus [HIV]: Secondary | ICD-10-CM

## 2017-12-14 DIAGNOSIS — I10 Essential (primary) hypertension: Secondary | ICD-10-CM

## 2017-12-14 DIAGNOSIS — Z79899 Other long term (current) drug therapy: Secondary | ICD-10-CM

## 2017-12-14 DIAGNOSIS — E7800 Pure hypercholesterolemia, unspecified: Secondary | ICD-10-CM

## 2017-12-14 MED ORDER — SERTRALINE HCL 50 MG PO TABS: ORAL_TABLET | ORAL | 0 refills | Status: DC

## 2017-12-14 NOTE — Telephone Encounter (Signed)
We are here multiple days per week every week.  The patient can schedule an appointment ahead of time we will be happy to see him

## 2017-12-14 NOTE — Telephone Encounter (Signed)
Patient contacted; med sent in to pharmacy but patient would like to come off this med. Will try to get appt as soon as he can

## 2017-12-14 NOTE — Telephone Encounter (Signed)
Pharmacy requesting refill on Sertraline 50mg  take 1/2-1 table daily as directed. Would like 90 day supply.

## 2017-12-14 NOTE — Telephone Encounter (Signed)
Pt returned call. Pt stated that each time he tries to get an appt; either the doctor is not in or the available times are not good for him. Stated he would like to talk about coming off the sertraline at next visit; states he has a whole bottle at home due to him only taking one half tablet a day

## 2017-12-14 NOTE — Telephone Encounter (Signed)
The patient may have a 90-day supply.  The patient does need to do a follow-up office visit somewhere within the next 3 months.  He had stated that it is difficult for him to schedule an office visit that he can come to that we are here.  There should be plenty of days between now and the end of 3 months that give him some reasonable options

## 2017-12-26 ENCOUNTER — Other Ambulatory Visit: Payer: Self-pay | Admitting: Family Medicine

## 2018-01-11 ENCOUNTER — Other Ambulatory Visit: Payer: Self-pay | Admitting: Family Medicine

## 2018-01-12 NOTE — Telephone Encounter (Signed)
May have refill needs office visit 

## 2018-01-19 ENCOUNTER — Other Ambulatory Visit: Payer: Self-pay | Admitting: Family Medicine

## 2018-01-28 ENCOUNTER — Encounter: Payer: Self-pay | Admitting: Family Medicine

## 2018-01-28 ENCOUNTER — Ambulatory Visit (INDEPENDENT_AMBULATORY_CARE_PROVIDER_SITE_OTHER): Payer: 59 | Admitting: Family Medicine

## 2018-01-28 VITALS — BP 128/88 | Ht 68.0 in | Wt 183.6 lb

## 2018-01-28 DIAGNOSIS — E119 Type 2 diabetes mellitus without complications: Secondary | ICD-10-CM | POA: Diagnosis not present

## 2018-01-28 DIAGNOSIS — G4709 Other insomnia: Secondary | ICD-10-CM

## 2018-01-28 DIAGNOSIS — E781 Pure hyperglyceridemia: Secondary | ICD-10-CM | POA: Diagnosis not present

## 2018-01-28 DIAGNOSIS — I10 Essential (primary) hypertension: Secondary | ICD-10-CM | POA: Diagnosis not present

## 2018-01-28 MED ORDER — LOSARTAN POTASSIUM 50 MG PO TABS
50.0000 mg | ORAL_TABLET | Freq: Every day | ORAL | 1 refills | Status: DC
Start: 2018-01-28 — End: 2018-02-12

## 2018-01-28 MED ORDER — PANTOPRAZOLE SODIUM 40 MG PO TBEC: 40.0000 mg | DELAYED_RELEASE_TABLET | Freq: Two times a day (BID) | ORAL | 1 refills | Status: DC

## 2018-01-28 MED ORDER — HYOSCYAMINE SULFATE ER 0.375 MG PO TB12: 0.3750 mg | ORAL_TABLET | Freq: Two times a day (BID) | ORAL | 1 refills | Status: DC | PRN

## 2018-01-28 MED ORDER — ALLOPURINOL 300 MG PO TABS: 300.0000 mg | ORAL_TABLET | Freq: Every day | ORAL | 1 refills | Status: DC

## 2018-01-28 MED ORDER — ALPRAZOLAM 0.5 MG PO TABS: ORAL_TABLET | ORAL | 5 refills | Status: DC

## 2018-01-28 MED ORDER — GEMFIBROZIL 600 MG PO TABS: 600.0000 mg | ORAL_TABLET | Freq: Every day | ORAL | 1 refills | Status: DC

## 2018-01-28 MED ORDER — PRAVASTATIN SODIUM 40 MG PO TABS: 40.0000 mg | ORAL_TABLET | Freq: Every day | ORAL | 1 refills | Status: DC

## 2018-01-28 MED ORDER — GLIPIZIDE ER 5 MG PO TB24: 5.0000 mg | ORAL_TABLET | Freq: Every day | ORAL | 1 refills | Status: DC

## 2018-01-28 MED ORDER — METFORMIN HCL ER 500 MG PO TB24: ORAL_TABLET | ORAL | 1 refills | Status: DC

## 2018-01-28 NOTE — Progress Notes (Signed)
Subjective:    Patient ID: Gregory Thompson, male    DOB: 1965-09-18, 52 y.o.   MRN: [id number]  Diabetes  He presents for his follow-up diabetic visit. He has type 2 diabetes mellitus. Pertinent negatives for hypoglycemia include no confusion or headaches. Pertinent negatives for diabetes include no chest pain, no fatigue, no polydipsia, no polyphagia and no weakness. Pertinent negatives for hypoglycemia complications include no blackouts. Risk factors for coronary artery disease include diabetes mellitus, dyslipidemia and hypertension. Current diabetic treatment includes oral agent (dual therapy). He is compliant with treatment all of the time. His weight is stable. He is following a diabetic diet.   Patient is going to to have blood work done when leaving office -not eaten since 8:30am   Patient does use tobacco products.  Patient knows they should quit.  Patient is aware that smoking/in use of tobacco products increases their risk of heart disease, cancer, and COPD- lung issues.  Patient has been counseled to quit smoking/tobacco products. Patient has a history of Account takes his medication he tries to watch his diet denies any flareups recently   Patient does have chronic insomnia Patient suffers with insomnia.  This is been going on for a while.  The patient finds it necessary to use medication to help sleep.  Patient finds it if not using medication has significant troubles.  Denies abusing the medication.  Denies any negative side effects.  Patient here for follow-up regarding cholesterol.  Patient does try to maintain a reasonable diet.  Patient does take the medication on a regular basis.  Denies missing a dose.  The patient denies any obvious side effects.  Prior blood work results reviewed with the patient.  The patient is aware of his cholesterol goals and the need to keep it under good control to lessen the risk of disease.  The patient was seen today as part of a comprehensive  diabetic check up.The patient relates medication compliance. No significant side effects to the medications. Denies any low glucose spells. Relates compliance with diet to a reasonable level. Patient does do labwork intermittently and understands the dangers of diabetes.    Patient does have IBS uses medication for this states that helps keep it under decent control denies any other particular setbacks  Check spot on foot.  He has a slightly red swollen area on top of his foot which is gradually been getting bigger it does not cause any pain or discomfort.  Review of Systems  Constitutional: Negative for activity change, appetite change and fatigue.  HENT: Negative for congestion and rhinorrhea.   Respiratory: Negative for cough, chest tightness and shortness of breath.   Cardiovascular: Negative for chest pain and leg swelling.  Gastrointestinal: Negative for abdominal pain, diarrhea and nausea.  Endocrine: Negative for polydipsia and polyphagia.  Genitourinary: Negative for dysuria and hematuria.  Neurological: Negative for weakness and headaches.  Psychiatric/Behavioral: Negative for confusion and dysphoric mood.       Objective:   Physical Exam  Constitutional: He appears well-nourished. No distress.  Cardiovascular: Normal rate, regular rhythm and normal heart sounds.  No murmur heard. Pulmonary/Chest: Effort normal and breath sounds normal. No respiratory distress.  Musculoskeletal: He exhibits no edema.  Lymphadenopathy:    He has no cervical adenopathy.  Neurological: He is alert.  Psychiatric: His behavior is normal.  Vitals reviewed.  The bump on his foot appears to be benign it is smooth soft nontender freely movable.  Is approximately 1.5 cm x 0.5  cm I did talk with him about possibly getting this looked at by podiatry and having it removed he states it is not bothering him he would prefer to watch if it does get larger we will refer  25 minutes was spent with the  patient.  This statement verifies that 25 minutes was indeed spent with the patient. Greater than half the time was spent in discussion, counseling and answering questions  regarding the issues that the patient came in for today as reflected in the diagnosis (s) please refer to documentation for further details.      Assessment & Plan:  HTN good control continue current measures watch diet  This patient was encouraged to quit smoking  Colonoscopy patient does not want to do this currently we will do stool testing for blood  Hyperlipidemia continue current medications  Irritable bowel under good control with current medication continue this  Insomnia uses Xanax at nighttime denies misusing it  Gout no recent flareups continue allopurinol await lab work  Recheck foot growth in 6 months on his follow-up

## 2018-01-29 LAB — HEPATIC FUNCTION PANEL
ALBUMIN: 5 g/dL (ref 3.5–5.5)
ALT: 48 IU/L — ABNORMAL HIGH (ref 0–44)
AST: 31 IU/L (ref 0–40)
Alkaline Phosphatase: 43 IU/L (ref 39–117)
Bilirubin Total: 0.5 mg/dL (ref 0.0–1.2)
Bilirubin, Direct: 0.16 mg/dL (ref 0.00–0.40)
TOTAL PROTEIN: 7.5 g/dL (ref 6.0–8.5)

## 2018-01-29 LAB — BASIC METABOLIC PANEL
BUN/Creatinine Ratio: 11 (ref 9–20)
BUN: 11 mg/dL (ref 6–24)
CALCIUM: 10.2 mg/dL (ref 8.7–10.2)
CHLORIDE: 102 mmol/L (ref 96–106)
CO2: 24 mmol/L (ref 20–29)
Creatinine, Ser: 0.98 mg/dL (ref 0.76–1.27)
GFR calc Af Amer: 103 mL/min/{1.73_m2} (ref >59)
GFR, EST NON AFRICAN AMERICAN: 90 mL/min/{1.73_m2} (ref >59)
GLUCOSE: 75 mg/dL (ref 65–99)
POTASSIUM: 4.4 mmol/L (ref 3.5–5.2)
Sodium: 141 mmol/L (ref 134–144)

## 2018-01-29 LAB — LIPID PANEL
Chol/HDL Ratio: 5.5 ratio — ABNORMAL HIGH (ref 0.0–5.0)
Cholesterol, Total: 165 mg/dL (ref 100–199)
HDL: 30 mg/dL — AB (ref >39)
LDL Calculated: 62 mg/dL (ref 0–99)
TRIGLYCERIDES: 363 mg/dL — AB (ref 0–149)
VLDL CHOLESTEROL CAL: 73 mg/dL — AB (ref 5–40)

## 2018-01-29 LAB — HIV ANTIBODY (ROUTINE TESTING W REFLEX): HIV Screen 4th Generation wRfx: NONREACTIVE

## 2018-01-29 LAB — HEMOGLOBIN A1C
ESTIMATED AVERAGE GLUCOSE: 120 mg/dL
Hgb A1c MFr Bld: 5.8 % — ABNORMAL HIGH (ref 4.8–5.6)

## 2018-01-29 LAB — MICROALBUMIN / CREATININE URINE RATIO
CREATININE, UR: 106.2 mg/dL
Microalb/Creat Ratio: 28.8 mg/g creat (ref 0.0–30.0)
Microalbumin, Urine: 30.6 ug/mL

## 2018-01-29 LAB — PSA: Prostate Specific Ag, Serum: 0.5 ng/mL (ref 0.0–4.0)

## 2018-01-30 ENCOUNTER — Other Ambulatory Visit: Payer: Self-pay | Admitting: Family Medicine

## 2018-01-30 MED ORDER — GLIPIZIDE ER 2.5 MG PO TB24: 5.0000 mg | ORAL_TABLET | Freq: Every day | ORAL | 1 refills | Status: DC

## 2018-02-02 ENCOUNTER — Telehealth: Payer: Self-pay | Admitting: *Deleted

## 2018-02-02 NOTE — Telephone Encounter (Signed)
Talked with Gregory Thompson. States that Friday night a knot popped up under breast bone and started vomiting. He is starting to feel a little better, he is still weak and no appetite and has lost 8 pounds since Friday. He has not vomited since Monday night and the knot is gone, pt stated he is just really weak. States that he can barely walk up and down steps without stopping to catch breath. Pt wanted to know if the elevated  liver enzyme could have something to do with this. 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 Pt also stated that if he got to bad he would call 911.

## 2018-02-02 NOTE — Telephone Encounter (Signed)
Sandra called [facility] to let Dr Gerda Diss know that patient has been sick since Friday lost 8 pounds, he has knots in his stomach, he has been throwing up. Gregory Thompson is concerned if something showed on his blood work to be causing this. Please advise [phone] or [phone]

## 2018-02-02 NOTE — Telephone Encounter (Signed)
Spoke with patient; patient states that if he does not feel any better tomorrow morning he will go to urgent care at [facility].

## 2018-02-02 NOTE — Telephone Encounter (Signed)
I do not feel the elevated liver enzymes is causing this.  There is something going on.  If he is that short of breath with activity I recommend urgent care at [facility].  We can also set him up an appointment to follow-up-unfortunately I do not have the evening hours.  Patient should consider going to the urgent care at [facility] for further evaluation-shortness of breath is a sign of potential problems much greater than liver enzymes

## 2018-02-06 ENCOUNTER — Other Ambulatory Visit: Payer: Self-pay

## 2018-02-06 ENCOUNTER — Inpatient Hospital Stay (HOSPITAL_COMMUNITY)
Admission: EM | Admit: 2018-02-06 | Discharge: 2018-02-12 | DRG: 418 | Disposition: A | Discharge status: Home / Self Care | Payer: 59 | Attending: Internal Medicine | Admitting: Internal Medicine

## 2018-02-06 ENCOUNTER — Encounter (HOSPITAL_COMMUNITY): Payer: Self-pay | Admitting: *Deleted

## 2018-02-06 ENCOUNTER — Emergency Department (HOSPITAL_COMMUNITY): Payer: 59

## 2018-02-06 DIAGNOSIS — K589 Irritable bowel syndrome without diarrhea: Secondary | ICD-10-CM | POA: Diagnosis present

## 2018-02-06 DIAGNOSIS — N179 Acute kidney failure, unspecified: Secondary | ICD-10-CM | POA: Diagnosis not present

## 2018-02-06 DIAGNOSIS — F17210 Nicotine dependence, cigarettes, uncomplicated: Secondary | ICD-10-CM | POA: Diagnosis present

## 2018-02-06 DIAGNOSIS — K8067 Calculus of gallbladder and bile duct with acute and chronic cholecystitis with obstruction: Secondary | ICD-10-CM | POA: Diagnosis not present

## 2018-02-06 DIAGNOSIS — Z87442 Personal history of urinary calculi: Secondary | ICD-10-CM | POA: Diagnosis not present

## 2018-02-06 DIAGNOSIS — R932 Abnormal findings on diagnostic imaging of liver and biliary tract: Secondary | ICD-10-CM | POA: Diagnosis not present

## 2018-02-06 DIAGNOSIS — K839 Disease of biliary tract, unspecified: Secondary | ICD-10-CM | POA: Diagnosis not present

## 2018-02-06 DIAGNOSIS — K833 Fistula of bile duct: Secondary | ICD-10-CM | POA: Diagnosis not present

## 2018-02-06 DIAGNOSIS — K580 Irritable bowel syndrome with diarrhea: Secondary | ICD-10-CM | POA: Diagnosis present

## 2018-02-06 DIAGNOSIS — Z888 Allergy status to other drugs, medicaments and biological substances status: Secondary | ICD-10-CM

## 2018-02-06 DIAGNOSIS — K8010 Calculus of gallbladder with chronic cholecystitis without obstruction: Secondary | ICD-10-CM | POA: Diagnosis not present

## 2018-02-06 DIAGNOSIS — K8000 Calculus of gallbladder with acute cholecystitis without obstruction: Secondary | ICD-10-CM

## 2018-02-06 DIAGNOSIS — K7460 Unspecified cirrhosis of liver: Secondary | ICD-10-CM | POA: Diagnosis present

## 2018-02-06 DIAGNOSIS — E785 Hyperlipidemia, unspecified: Secondary | ICD-10-CM | POA: Diagnosis present

## 2018-02-06 DIAGNOSIS — E781 Pure hyperglyceridemia: Secondary | ICD-10-CM | POA: Diagnosis present

## 2018-02-06 DIAGNOSIS — R740 Nonspecific elevation of levels of transaminase and lactic acid dehydrogenase [LDH]: Secondary | ICD-10-CM

## 2018-02-06 DIAGNOSIS — K8042 Calculus of bile duct with acute cholecystitis without obstruction: Secondary | ICD-10-CM | POA: Diagnosis not present

## 2018-02-06 DIAGNOSIS — I951 Orthostatic hypotension: Secondary | ICD-10-CM | POA: Diagnosis not present

## 2018-02-06 DIAGNOSIS — R17 Unspecified jaundice: Secondary | ICD-10-CM

## 2018-02-06 DIAGNOSIS — Z7984 Long term (current) use of oral hypoglycemic drugs: Secondary | ICD-10-CM | POA: Diagnosis not present

## 2018-02-06 DIAGNOSIS — E119 Type 2 diabetes mellitus without complications: Secondary | ICD-10-CM | POA: Diagnosis not present

## 2018-02-06 DIAGNOSIS — K219 Gastro-esophageal reflux disease without esophagitis: Secondary | ICD-10-CM | POA: Diagnosis not present

## 2018-02-06 DIAGNOSIS — R7989 Other specified abnormal findings of blood chemistry: Secondary | ICD-10-CM | POA: Diagnosis present

## 2018-02-06 DIAGNOSIS — I10 Essential (primary) hypertension: Secondary | ICD-10-CM | POA: Diagnosis not present

## 2018-02-06 DIAGNOSIS — K760 Fatty (change of) liver, not elsewhere classified: Secondary | ICD-10-CM | POA: Diagnosis not present

## 2018-02-06 DIAGNOSIS — E860 Dehydration: Secondary | ICD-10-CM | POA: Diagnosis present

## 2018-02-06 DIAGNOSIS — M109 Gout, unspecified: Secondary | ICD-10-CM | POA: Diagnosis present

## 2018-02-06 DIAGNOSIS — R945 Abnormal results of liver function studies: Secondary | ICD-10-CM | POA: Diagnosis not present

## 2018-02-06 DIAGNOSIS — R7401 Elevation of levels of liver transaminase levels: Secondary | ICD-10-CM

## 2018-02-06 DIAGNOSIS — F40240 Claustrophobia: Secondary | ICD-10-CM | POA: Diagnosis present

## 2018-02-06 DIAGNOSIS — Z720 Tobacco use: Secondary | ICD-10-CM

## 2018-02-06 DIAGNOSIS — Z833 Family history of diabetes mellitus: Secondary | ICD-10-CM

## 2018-02-06 DIAGNOSIS — K8050 Calculus of bile duct without cholangitis or cholecystitis without obstruction: Secondary | ICD-10-CM | POA: Diagnosis not present

## 2018-02-06 DIAGNOSIS — R110 Nausea: Secondary | ICD-10-CM | POA: Diagnosis not present

## 2018-02-06 DIAGNOSIS — Z79899 Other long term (current) drug therapy: Secondary | ICD-10-CM | POA: Diagnosis not present

## 2018-02-06 DIAGNOSIS — K8020 Calculus of gallbladder without cholecystitis without obstruction: Secondary | ICD-10-CM | POA: Diagnosis not present

## 2018-02-06 DIAGNOSIS — Z419 Encounter for procedure for purposes other than remedying health state, unspecified: Secondary | ICD-10-CM

## 2018-02-06 DIAGNOSIS — R531 Weakness: Secondary | ICD-10-CM | POA: Diagnosis not present

## 2018-02-06 HISTORY — DX: Personal history of other diseases of the digestive system: Z87.19

## 2018-02-06 HISTORY — DX: Adverse effect of unspecified anesthetic, initial encounter: T41.45XA

## 2018-02-06 HISTORY — DX: Other complications of anesthesia, initial encounter: T88.59XA

## 2018-02-06 LAB — COMPREHENSIVE METABOLIC PANEL
ALT: 294 U/L — ABNORMAL HIGH (ref 17–63)
ANION GAP: 11 (ref 5–15)
AST: 143 U/L — ABNORMAL HIGH (ref 15–41)
Albumin: 3.7 g/dL (ref 3.5–5.0)
Alkaline Phosphatase: 193 U/L — ABNORMAL HIGH (ref 38–126)
BILIRUBIN TOTAL: 14.9 mg/dL — AB (ref 0.3–1.2)
BUN: 35 mg/dL — AB (ref 6–20)
CO2: 21 mmol/L — ABNORMAL LOW (ref 22–32)
Calcium: 9.7 mg/dL (ref 8.9–10.3)
Chloride: 101 mmol/L (ref 101–111)
Creatinine, Ser: 1.86 mg/dL — ABNORMAL HIGH (ref 0.61–1.24)
GFR, EST AFRICAN AMERICAN: 47 mL/min — AB (ref >60)
GFR, EST NON AFRICAN AMERICAN: 41 mL/min — AB (ref >60)
Glucose, Bld: 176 mg/dL — ABNORMAL HIGH (ref 65–99)
POTASSIUM: 4.1 mmol/L (ref 3.5–5.1)
Sodium: 133 mmol/L — ABNORMAL LOW (ref 135–145)
TOTAL PROTEIN: 8.1 g/dL (ref 6.5–8.1)

## 2018-02-06 LAB — GLUCOSE, CAPILLARY: Glucose-Capillary: 124 mg/dL — ABNORMAL HIGH (ref 65–99)

## 2018-02-06 LAB — URINALYSIS, ROUTINE W REFLEX MICROSCOPIC
Bacteria, UA: NONE SEEN
Glucose, UA: NEGATIVE mg/dL
Hgb urine dipstick: NEGATIVE
Ketones, ur: NEGATIVE mg/dL
Leukocytes, UA: NEGATIVE
Nitrite: NEGATIVE
Protein, ur: 30 mg/dL — AB
SPECIFIC GRAVITY, URINE: 1.019 (ref 1.005–1.030)
pH: 5 (ref 5.0–8.0)

## 2018-02-06 LAB — CBC WITH DIFFERENTIAL/PLATELET
Basophils Absolute: 0.1 10*3/uL (ref 0.0–0.1)
Basophils Relative: 1 %
Eosinophils Absolute: 0.4 10*3/uL (ref 0.0–0.7)
Eosinophils Relative: 5 %
HEMATOCRIT: 40.3 % (ref 39.0–52.0)
Hemoglobin: 13.9 g/dL (ref 13.0–17.0)
Lymphocytes Relative: 16 %
Lymphs Abs: 1.3 10*3/uL (ref 0.7–4.0)
MCH: 31 pg (ref 26.0–34.0)
MCHC: 34.5 g/dL (ref 30.0–36.0)
MCV: 89.8 fL (ref 78.0–100.0)
MONO ABS: 0.6 10*3/uL (ref 0.1–1.0)
Monocytes Relative: 7 %
NEUTROS ABS: 5.8 10*3/uL (ref 1.7–7.7)
NEUTROS PCT: 71 %
Platelets: 262 10*3/uL (ref 150–400)
RBC: 4.49 MIL/uL (ref 4.22–5.81)
RDW: 14.3 % (ref 11.5–15.5)
WBC: 8.2 10*3/uL (ref 4.0–10.5)

## 2018-02-06 LAB — LIPASE, BLOOD: Lipase: 44 U/L (ref 11–51)

## 2018-02-06 LAB — BILIRUBIN, FRACTIONATED(TOT/DIR/INDIR)
BILIRUBIN DIRECT: 10 mg/dL — AB (ref 0.1–0.5)
BILIRUBIN TOTAL: 15 mg/dL — AB (ref 0.3–1.2)
Indirect Bilirubin: 5 mg/dL — ABNORMAL HIGH (ref 0.3–0.9)

## 2018-02-06 LAB — TROPONIN I: Troponin I: <0.03 ng/mL (ref <0.03)

## 2018-02-06 LAB — PROTIME-INR
INR: 1.05
Prothrombin Time: 13.6 seconds (ref 11.4–15.2)

## 2018-02-06 MED ORDER — SODIUM CHLORIDE 0.9 % IV BOLUS
1000.0000 mL | Freq: Once | INTRAVENOUS | Status: AC
  Administered 2018-02-06: 1000 mL via INTRAVENOUS

## 2018-02-06 MED ORDER — ONDANSETRON HCL 4 MG PO TABS: 4.0000 mg | ORAL_TABLET | Freq: Four times a day (QID) | ORAL | Status: DC | PRN

## 2018-02-06 MED ORDER — INSULIN ASPART 100 UNIT/ML ~~LOC~~ SOLN: 0.0000 [IU] | Freq: Every day | SUBCUTANEOUS | Status: DC

## 2018-02-06 MED ORDER — HYOSCYAMINE SULFATE ER 0.375 MG PO TB12
0.3750 mg | ORAL_TABLET | Freq: Two times a day (BID) | ORAL | Status: DC | PRN
  Administered 2018-02-06 – 2018-02-07 (×3): 0.375 mg via ORAL
  Filled 2018-02-06 (×4): qty 1

## 2018-02-06 MED ORDER — ACETAMINOPHEN 650 MG RE SUPP: 650.0000 mg | Freq: Four times a day (QID) | RECTAL | Status: DC | PRN

## 2018-02-06 MED ORDER — ACETAMINOPHEN 325 MG PO TABS
650.0000 mg | ORAL_TABLET | Freq: Four times a day (QID) | ORAL | Status: DC | PRN
  Filled 2018-02-06: qty 2

## 2018-02-06 MED ORDER — PIPERACILLIN-TAZOBACTAM 3.375 G IVPB 30 MIN
3.3750 g | Freq: Once | INTRAVENOUS | Status: AC
  Administered 2018-02-06: 3.375 g via INTRAVENOUS
  Filled 2018-02-06: qty 50

## 2018-02-06 MED ORDER — PANTOPRAZOLE SODIUM 40 MG PO TBEC
40.0000 mg | DELAYED_RELEASE_TABLET | Freq: Two times a day (BID) | ORAL | Status: DC
  Administered 2018-02-06 – 2018-02-11 (×9): 40 mg via ORAL
  Filled 2018-02-06 (×9): qty 1

## 2018-02-06 MED ORDER — SODIUM CHLORIDE 0.9 % IV SOLN
INTRAVENOUS | Status: DC
  Administered 2018-02-06: 15:00:00 via INTRAVENOUS

## 2018-02-06 MED ORDER — ALPRAZOLAM 0.5 MG PO TABS
0.5000 mg | ORAL_TABLET | Freq: Every evening | ORAL | Status: DC | PRN
  Administered 2018-02-06 – 2018-02-11 (×4): 0.5 mg via ORAL
  Filled 2018-02-06 (×4): qty 1

## 2018-02-06 MED ORDER — ONDANSETRON HCL 4 MG/2ML IJ SOLN
4.0000 mg | Freq: Four times a day (QID) | INTRAMUSCULAR | Status: DC | PRN
  Administered 2018-02-08 – 2018-02-11 (×5): 4 mg via INTRAVENOUS
  Filled 2018-02-06 (×5): qty 2

## 2018-02-06 MED ORDER — ALLOPURINOL 300 MG PO TABS
300.0000 mg | ORAL_TABLET | Freq: Every day | ORAL | Status: DC
  Administered 2018-02-06: 300 mg via ORAL
  Filled 2018-02-06 (×2): qty 1

## 2018-02-06 MED ORDER — PRAVASTATIN SODIUM 40 MG PO TABS
40.0000 mg | ORAL_TABLET | Freq: Every day | ORAL | Status: DC
  Administered 2018-02-06: 40 mg via ORAL
  Filled 2018-02-06 (×2): qty 1

## 2018-02-06 MED ORDER — GEMFIBROZIL 600 MG PO TABS
600.0000 mg | ORAL_TABLET | Freq: Every day | ORAL | Status: DC
  Administered 2018-02-07 – 2018-02-12 (×5): 600 mg via ORAL
  Filled 2018-02-06 (×6): qty 1

## 2018-02-06 MED ORDER — INSULIN ASPART 100 UNIT/ML ~~LOC~~ SOLN
0.0000 [IU] | Freq: Three times a day (TID) | SUBCUTANEOUS | Status: DC
  Administered 2018-02-08: 3 [IU] via SUBCUTANEOUS
  Administered 2018-02-08: 2 [IU] via SUBCUTANEOUS

## 2018-02-06 MED ORDER — IOPAMIDOL (ISOVUE-300) INJECTION 61%: 100.0000 mL | Freq: Once | INTRAVENOUS | Status: DC | PRN

## 2018-02-06 MED ORDER — LOSARTAN POTASSIUM 25 MG PO TABS
50.0000 mg | ORAL_TABLET | Freq: Every day | ORAL | Status: DC
  Administered 2018-02-06: 50 mg via ORAL
  Filled 2018-02-06 (×2): qty 2

## 2018-02-06 MED ORDER — ENOXAPARIN SODIUM 40 MG/0.4ML ~~LOC~~ SOLN
40.0000 mg | SUBCUTANEOUS | Status: DC
  Administered 2018-02-06: 40 mg via SUBCUTANEOUS
  Filled 2018-02-06 (×2): qty 0.4

## 2018-02-06 NOTE — ED Triage Notes (Signed)
Pt with jaundice, hx of liver damage, since Friday.  Pt with emesis a week ago of food eaten.  Denies diarrhea or Nausea.

## 2018-02-06 NOTE — H&P (Signed)
History and Physical  DEJEAN TRIBBY Thompson:[id number] DOB: [birth year] DOA: 02/06/2018  Referring physician: Dr Clarene Duke, ED physician PCP: Babs Sciara, MD  Outpatient Specialists: none  Patient Coming From: home  Chief Complaint: jaundice  HPI: Gregory Thompson is a 52 y.o. male with a history of Gout, fatty liver, Hypertension, hypertriglyceridemia, diabetes.  Patient seen for jaundice and has been increasing over the past 2 days.  Approximately 1 week ago, the patient had 3 days of nausea, vomiting, diarrhea which is now improved.  He does have residual lack of appetite, weakness, and fatigue, but this has improved with IV fluids.  Emesis described as stomach contents.  He denies abdominal pain, changes of symptoms with food.  Does have a history of fatty liver disease and gallstones and does have a history of elevated triglyceride levels.  His jaundice is worsening.  Pt denies IV drug use, exposure to other people's blood.  Emergency Department Course: LFT is elevated no obstructive pattern.  CT scan showed 6 mm gallstone in the gallbladder bladder neck.  General surgery was consulted who recommended IV antibiotics, however did not think that this was cholecystitis or cholelithiasis as the patient was without pain.  IV fluids given  Review of Systems:   Pt denies any fevers, chills, nausea, vomiting, diarrhea, constipation, abdominal pain, shortness of breath, dyspnea on exertion, orthopnea, cough, wheezing, palpitations, headache, vision changes, lightheadedness, dizziness, melena, rectal bleeding.  Review of systems are otherwise negative  Past Medical History:  Diagnosis Date  . Fatty liver   . Gout   . Hx of gallstones   . Hypertension   . Hypertriglyceridemia   . Kidney stone   . Liver cirrhosis (HCC) 1997   related to increased TG  . Prediabetes    Past Surgical History:  Procedure Laterality Date  . LIVER BIOPSY    . VASECTOMY     Social History:  reports that he  has been smoking cigarettes.  He has been smoking about 1.00 pack per day. He has never used smokeless tobacco. He reports that he does not drink alcohol or use drugs. Patient lives at home  Allergies  Allergen Reactions  . Amoxicillin-Pot Clavulanate Other (See Comments)    Abdominal pain Abdominal pain  . Eluxadoline Other (See Comments)    Low blood sugar Low blood sugar  . Azithromycin     Abdominal pain  . Cefzil [Cefprozil]     Abdominal pain  . Lisinopril     Cough has a side effect    Family History  Problem Relation Age of Onset  . Hyperlipidemia Father   . Diabetes Paternal Grandfather       Prior to Admission medications   Medication Sig Start Date End Date Taking? Authorizing Provider  acetaminophen (TYLENOL ARTHRITIS PAIN) 650 MG CR tablet Take 650 mg by mouth every 8 (eight) hours as needed for pain.   Yes [provider]  allopurinol (ZYLOPRIM) 300 MG tablet Take 1 tablet (300 mg total) by mouth daily. 01/28/18  Yes Babs Sciara, MD  ALPRAZolam Prudy Feeler) 0.5 MG tablet TAKE ONE TABLET BY MOUTH AT BEDTIME AS NEEDED FOR SLEEP/ANXIETY 01/28/18  Yes Babs Sciara, MD  Blood Glucose Monitoring Suppl MISC Glucose testing supplies-test once a day 09/25/16  Yes Luking, Scott A, MD  CINNAMON PO Take 2,000 mg by mouth daily. 2,000 mg    Yes [provider]  gemfibrozil (LOPID) 600 MG tablet Take 1 tablet (600 mg total) by mouth daily.  01/28/18  Yes Luking, Jonna CoupScott A, MD  glipiZIDE (GLUCOTROL XL) 2.5 MG 24 hr tablet Take 2 tablets (5 mg total) by mouth daily with breakfast. Patient taking differently: Take 2.5 mg by mouth daily with breakfast.  01/30/18  Yes Luking, Jonna CoupScott A, MD  hyoscyamine (LEVBID) 0.375 MG 12 hr tablet Take 1 tablet (0.375 mg total) by mouth every 12 (twelve) hours as needed. 01/28/18  Yes Babs SciaraLuking, Scott A, MD  losartan (COZAAR) 50 MG tablet Take 1 tablet (50 mg total) by mouth daily. 01/28/18  Yes Luking, Jonna CoupScott A, MD  metFORMIN (GLUCOPHAGE-XR)  500 MG 24 hr tablet TAKE 1 TABLET BY MOUTH EVERY DAY WITH BREAKFAST 01/28/18  Yes Luking, Jonna CoupScott A, MD  Omega-3 Fatty Acids (OMEGA-3 FISH OIL PO) Take by mouth.   Yes [provider]  ONE TOUCH ULTRA TEST test strip TEST ONCE DAILY AS DIRECTED 09/25/16  Yes Luking, Jonna CoupScott A, MD  pantoprazole (PROTONIX) 40 MG tablet Take 1 tablet (40 mg total) by mouth 2 (two) times daily. 01/28/18  Yes Babs SciaraLuking, Scott A, MD  pravastatin (PRAVACHOL) 40 MG tablet Take 1 tablet (40 mg total) by mouth daily. 01/28/18  Yes Babs SciaraLuking, Scott A, MD  Probiotic Product (PROBIOTIC FORMULA PO) Take 1 capsule by mouth daily. PHILLIPS   Yes [provider]    Physical Exam: BP 119/77   Pulse 84   Temp 98.4 F (36.9 C) (Oral)   Resp 16   Ht 5\' 8"  (1.727 m)   Wt 79.8 kg (176 lb)   SpO2 99%   BMI 26.76 kg/m   . General: Middle-aged Caucasian male. Awake and alert and oriented x3. No acute cardiopulmonary distress.  Marland Kitchen. HEENT: Normocephalic atraumatic.  Right and left ears normal in appearance.  Pupils equal, round, reactive to light. Extraocular muscles are intact. Sclerae icteric and noninjected.  Moist mucosal membranes. No mucosal lesions.  . Neck: Neck supple without lymphadenopathy. No carotid bruits. No masses palpated.  . Cardiovascular: Regular rate with normal S1-S2 sounds. No murmurs, rubs, gallops auscultated. No JVD.  Marland Kitchen. Respiratory: Good respiratory effort with no wheezes, rales, rhonchi. Lungs clear to auscultation bilaterally.  No accessory muscle use. . Abdomen: Soft, nontender, nondistended. Active bowel sounds. No masses or hepatosplenomegaly  . Skin: Jaundiced.  No rashes, lesions, or ulcerations.  Dry, warm to touch. 2+ dorsalis pedis and radial pulses. . Musculoskeletal: No calf or leg pain. All major joints not erythematous nontender.  No upper or lower joint deformation.  Good ROM.  No contractures  . Psychiatric: Intact judgment and insight. Pleasant and cooperative. . Neurologic: No focal  neurological deficits. Strength is 5/5 and symmetric in upper and lower extremities.  Cranial nerves II through XII are grossly intact.           Labs on Admission: I have personally reviewed following labs and imaging studies  CBC: Recent Labs  Lab 02/06/18 1347  WBC 8.2  NEUTROABS 5.8  HGB 13.9  HCT 40.3  MCV 89.8  PLT 262   Basic Metabolic Panel: Recent Labs  Lab 02/06/18 1347  NA 133*  K 4.1  CL 101  CO2 21*  GLUCOSE 176*  BUN 35*  CREATININE 1.86*  CALCIUM 9.7   GFR: Estimated Creatinine Clearance: 46 mL/min (A) (by C-G formula based on SCr of 1.86 mg/dL (H)). Liver Function Tests: Recent Labs  Lab 02/06/18 1347  AST 143*  ALT 294*  ALKPHOS 193*  BILITOT 15.0*  14.9*  PROT 8.1  ALBUMIN 3.7  Recent Labs  Lab 02/06/18 1347  LIPASE 44   No results for input(s): AMMONIA in the last 168 hours. Coagulation Profile: Recent Labs  Lab 02/06/18 1347  INR 1.05   Cardiac Enzymes: Recent Labs  Lab 02/06/18 1347  TROPONINI <0.03   BNP (last 3 results) No results for input(s): PROBNP in the last 8760 hours. HbA1C: No results for input(s): HGBA1C in the last 72 hours. CBG: No results for input(s): GLUCAP in the last 168 hours. Lipid Profile: No results for input(s): CHOL, HDL, LDLCALC, TRIG, CHOLHDL, LDLDIRECT in the last 72 hours. Thyroid Function Tests: No results for input(s): TSH, T4TOTAL, FREET4, T3FREE, THYROIDAB in the last 72 hours. Anemia Panel: No results for input(s): VITAMINB12, FOLATE, FERRITIN, TIBC, IRON, RETICCTPCT in the last 72 hours. Urine analysis:    Component Value Date/Time   COLORURINE AMBER (A) 02/06/2018 1355   APPEARANCEUR HAZY (A) 02/06/2018 1355   LABSPEC 1.019 02/06/2018 1355   PHURINE 5.0 02/06/2018 1355   GLUCOSEU NEGATIVE 02/06/2018 1355   HGBUR NEGATIVE 02/06/2018 1355   BILIRUBINUR MODERATE (A) 02/06/2018 1355   KETONESUR NEGATIVE 02/06/2018 1355   PROTEINUR 30 (A) 02/06/2018 1355   NITRITE NEGATIVE  02/06/2018 1355   LEUKOCYTESUR NEGATIVE 02/06/2018 1355   Sepsis Labs: @LABRCNTIP (procalcitonin:4,lacticidven:4) )No results found for this or any previous visit (from the past 240 hour(s)).   Radiological Exams on Admission: Ct Abdomen Pelvis Wo Contrast  Result Date: 02/06/2018 CLINICAL DATA:  Jaundice for 3 days. EXAM: CT ABDOMEN AND PELVIS WITHOUT CONTRAST TECHNIQUE: Multidetector CT imaging of the abdomen and pelvis was performed following the standard protocol without IV contrast. COMPARISON:  CT abdomen and pelvis 08/10/2016. FINDINGS: Lower chest: Lung bases are clear. No pleural or pericardial effusion. Heart size is normal. Hepatobiliary: The liver is diffusely low attenuating. The liver border appears smooth. No hypertrophy of the caudate lobe or atrophy of the remainder of the liver is identified. The gallbladder is distended with stones and sludge present. The gallbladder wall appears thickened at 0.6 cm and there may be a small amount of pericholecystic fluid. A stone is seen in the neck of the gallbladder. Biliary tree is unremarkable. Pancreas: Unremarkable. No pancreatic ductal dilatation or surrounding inflammatory changes. Spleen: Normal in size without focal abnormality. Adrenals/Urinary Tract: Adrenal glands are unremarkable. Kidneys are normal, without renal calculi, focal lesion, or hydronephrosis. Bladder is unremarkable. Stomach/Bowel: Stomach is within normal limits. Appendix appears normal. No evidence of bowel wall thickening, distention, or inflammatory changes. Vascular/Lymphatic: No significant vascular findings are present. No enlarged abdominal or pelvic lymph nodes. Reproductive: Prostate is unremarkable. Other: No ascites.  No hernia. Musculoskeletal: No acute or focal abnormality. Degenerative disc disease lower lumbar spine noted. IMPRESSION: Gallstones, sludge and gallbladder wall thickening worrisome for cholecystitis. Right upper quadrant ultrasound is recommended for  further evaluation. Diffuse fatty infiltration of the liver. Electronically Signed   By: Drusilla Kanner M.D.   On: 02/06/2018 14:59   Dg Chest 2 View  Result Date: 02/06/2018 CLINICAL DATA:  Weakness and jaundice EXAM: CHEST - 2 VIEW COMPARISON:  None. FINDINGS: The heart size and mediastinal contours are within normal limits. Both lungs are clear. The visualized skeletal structures are unremarkable. IMPRESSION: No active cardiopulmonary disease. Electronically Signed   By: Alcide Clever M.D.   On: 02/06/2018 14:20    EKG: Independently reviewed.  Sinus rhythm  Assessment/Plan: Principal Problem:   Elevated LFTs Active Problems:   GERD (gastroesophageal reflux disease)   Tobacco abuse   Hypertriglyceridemia  Irritable bowel syndrome   Type 2 diabetes mellitus (HCC)   HTN (hypertension)   Acute renal injury [facility])    This patient was discussed with the ED physician, including pertinent vitals, physical exam findings, labs, and imaging.  We also discussed care given by the ED provider.  1. Elevated LFTs a. Admit b. We will add on differentiation of bilirubin c. MRCP d. GI consult e. Acute hepatitis panel f. Lipid panel g. IV fluids h. Recheck labs in the morning 2. Acute renal injury a. Recheck creatinine in the morning b. We will rehydrate with IV fluids 3. Hypertriglyceridemia a. Continue gemfibrozil 4. Diabetes a. Hold metformin and glyburide b. Sliding scale insulin c. CBGs before meals and nightly 5. Hypertension a. Continue antihypertensives 6. Irritable bowel a. Continue hycosamine 7. GERD a. Continue protonix 8. Tobacco abuse a. Pt declines patch  DVT prophylaxis: lovenox Consultants: GI Code Status: Full code Family Communication: wife in room  Disposition Plan: Patient to return home following workup.   Levie Heritage, DO [facility] Pager [phone]  If 7PM-7AM, please contact night-coverage [email or web address] Password [email or web address]

## 2018-02-06 NOTE — ED Notes (Signed)
Attempted to call report to 3rd Floor. Nurse unavailable at this time.

## 2018-02-06 NOTE — ED Provider Notes (Signed)
[facility] Provider Note   CSN: [id number] Arrival date & time: 02/06/18  1313     History   Chief Complaint Chief Complaint  Patient presents with  . Jaundice    HPI Elmer BalesStephen D Ueda is a 52 y.o. male.  HPI  Pt was seen at 1330.  Per pt and his family, c/o gradual onset and persistence of constant generalized weakness/fatigue for the past 1 week. Pt states 1 week ago he "felt a knot come up" in his epigastric area. States he vomited several times and it improved. Pt continued to have multiple episodes of N/V for several days last week. States he now has stopped vomiting, but has lost several pounds of weight and feels generally weak/fatigued. Pt states he was evaluated by his PMD last week with labs testing revealing "slightly elevated liver tests," of which pt states he has a history of. Pt states over the past 2 days, he has developed "yellowing" of his skin and eyes. Pt denies any abd pain, no further episodes of N/V, no diarrhea, no CP/palpitations, no SOB/cough, no fevers, no rash.    Past Medical History:  Diagnosis Date  . Fatty liver   . Gout   . Hx of gallstones   . Hypertension   . Hypertriglyceridemia   . Kidney stone   . Liver cirrhosis (HCC) 1997   related to increased TG  . Prediabetes     Patient Active Problem List   Diagnosis Date Noted  . HTN (hypertension) 07/02/2017  . Type 2 diabetes mellitus (HCC) 03/29/2015  . Hyperglycemia 02/27/2014  . Hypertriglyceridemia 02/27/2014  . Irritable bowel syndrome 02/27/2014  . Hyperlipidemia 03/31/2013  . Gout 03/31/2013  . Tobacco abuse 03/31/2013  . GERD (gastroesophageal reflux disease) 03/24/2013    Past Surgical History:  Procedure Laterality Date  . LIVER BIOPSY    . VASECTOMY          Home Medications    Prior to Admission medications   Medication Sig Start Date End Date Taking? Authorizing Provider  acetaminophen (TYLENOL ARTHRITIS PAIN) 650 MG CR tablet Take 650 mg by  mouth every 8 (eight) hours as needed for pain.    [provider]  allopurinol (ZYLOPRIM) 300 MG tablet Take 1 tablet (300 mg total) by mouth daily. 01/28/18   Babs SciaraLuking, Scott A, MD  ALPRAZolam Prudy Feeler(XANAX) 0.5 MG tablet TAKE ONE TABLET BY MOUTH AT BEDTIME AS NEEDED FOR SLEEP/ANXIETY 01/28/18   Babs SciaraLuking, Scott A, MD  Blood Glucose Monitoring Suppl MISC Glucose testing supplies-test once a day 09/25/16   Babs SciaraLuking, Scott A, MD  buPROPion (WELLBUTRIN SR) 150 MG 12 hr tablet Take 1 tablet (150 mg total) by mouth 2 (two) times daily. Patient not taking: Reported on 07/02/2017 02/19/17   Babs SciaraLuking, Scott A, MD  CINNAMON PO Take 2,000 mg by mouth daily. 2,000 mg     [provider]  diphenoxylate-atropine (LOMOTIL) 2.5-0.025 MG tablet Take 1 tablet by mouth three times a day as needed for diarrhea. Patient taking differently: Take 1 tablet by mouth 3 (three) times daily as needed for diarrhea or loose stools. Take 1 tablet by mouth three times a day as needed for diarrhea. 09/30/15   Babs SciaraLuking, Scott A, MD  gemfibrozil (LOPID) 600 MG tablet Take 1 tablet (600 mg total) by mouth daily. 01/28/18   Babs SciaraLuking, Scott A, MD  glipiZIDE (GLUCOTROL XL) 2.5 MG 24 hr tablet Take 2 tablets (5 mg total) by mouth daily with breakfast. 01/30/18  Babs Sciara, MD  hyoscyamine (LEVBID) 0.375 MG 12 hr tablet Take 1 tablet (0.375 mg total) by mouth every 12 (twelve) hours as needed. 01/28/18   Babs Sciara, MD  losartan (COZAAR) 50 MG tablet Take 1 tablet (50 mg total) by mouth daily. 01/28/18   Babs Sciara, MD  metFORMIN (GLUCOPHAGE-XR) 500 MG 24 hr tablet TAKE 1 TABLET BY MOUTH EVERY DAY WITH BREAKFAST 01/28/18   Babs Sciara, MD  Omega-3 Fatty Acids (OMEGA-3 FISH OIL PO) Take by mouth.    [provider]  ondansetron (ZOFRAN ODT) 4 MG disintegrating tablet Take 1 tablet (4 mg total) by mouth every 8 (eight) hours as needed for nausea or vomiting. 08/10/16   Derwood Kaplan, MD  ONE TOUCH ULTRA TEST test strip  TEST ONCE DAILY AS DIRECTED 09/25/16   Babs Sciara, MD  pantoprazole (PROTONIX) 40 MG tablet Take 1 tablet (40 mg total) by mouth 2 (two) times daily. 01/28/18   Babs Sciara, MD  pravastatin (PRAVACHOL) 40 MG tablet Take 1 tablet (40 mg total) by mouth daily. 01/28/18   Babs Sciara, MD  Probiotic Product (PROBIOTIC FORMULA PO) Take 1 capsule by mouth daily. PHILLIPS    [provider]    Family History Family History  Problem Relation Age of Onset  . Hyperlipidemia Father   . Diabetes Paternal Grandfather     Social History Social History   Tobacco Use  . Smoking status: Current Every Day Smoker    Packs/day: 1.00    Types: Cigarettes  . Smokeless tobacco: Never Used  Substance Use Topics  . Alcohol use: No  . Drug use: No     Allergies   Augmentin [amoxicillin-pot clavulanate]; Azithromycin; Cefzil [cefprozil]; Lisinopril; and Viberzi [eluxadoline]   Review of Systems Review of Systems ROS: Statement: All systems negative except as marked or noted in the HPI; Constitutional: +generalized weakness/fatigue. Negative for fever and chills. ; ; Eyes: Negative for eye pain, redness and discharge. +"yellow eyes."; ; ENMT: Negative for ear pain, hoarseness, nasal congestion, sinus pressure and sore throat. ; ; Cardiovascular: Negative for chest pain, palpitations, diaphoresis, dyspnea and peripheral edema. ; ; Respiratory: Negative for cough, wheezing and stridor. ; ; Gastrointestinal: +N/V. Negative for diarrhea, abdominal pain, blood in stool, hematemesis, jaundice and rectal bleeding. . ; ; Genitourinary: Negative for dysuria, flank pain and hematuria. ; ; Musculoskeletal: Negative for back pain and neck pain. Negative for swelling and trauma.; ; Skin: +"yellow skin." Negative for pruritus, rash, abrasions, blisters, bruising and skin lesion.; ; Neuro: Negative for headache, lightheadedness and neck stiffness. Negative for altered level of consciousness, altered mental  status, extremity weakness, paresthesias, involuntary movement, seizure and syncope.      Physical Exam Updated Vital Signs BP 107/73   Pulse 96   Temp 98.4 F (36.9 C) (Oral)   Resp 14   Ht 5\' 8"  (1.727 m)   Wt 79.8 kg (176 lb)   SpO2 100%   BMI 26.76 kg/m    Patient Vitals for the past 24 hrs:  BP Temp Temp src Pulse Resp SpO2 Height Weight  02/06/18 1448 114/75 - - 85 (!) 23 100 % - -  02/06/18 1405 107/73 - - 96 14 100 % - -  02/06/18 1400 108/73 - - 100 18 96 % - -  02/06/18 1358 107/73 - - 99 (!) 22 96 % - -  02/06/18 1320 111/73 98.4 F (36.9 C) Oral (!) 113 18 100 % - -  02/06/18 1319 - - - - - - 5\' 8"  (1.727 m) 79.8 kg (176 lb)     13:59 Orthostatic Vital Signs KS  Orthostatic Lying   BP- Lying: 107/73   Pulse- Lying: 96       Orthostatic Sitting  BP- Sitting: 108/73   Pulse- Sitting: 100       Orthostatic Standing at 0 minutes  BP- Standing at 0 minutes: 95/65   Pulse- Standing at 0 minutes: 116     Physical Exam 1335: Physical examination:  Nursing notes reviewed; Vital signs and O2 SAT reviewed;  Constitutional: Well developed, Well nourished, In no acute distress; Head:  Normocephalic, atraumatic; Eyes: EOMI, PERRL, +scleral icterus; ENMT: Mouth and pharynx normal, Mucous membranes dry; Neck: Supple, Full range of motion, No lymphadenopathy; Cardiovascular: Regular rate and rhythm, No gallop; Respiratory: Breath sounds clear & equal bilaterally, No wheezes.  Speaking full sentences with ease, Normal respiratory effort/excursion; Chest: Nontender, Movement normal; Abdomen: Soft, Nontender, Nondistended, Normal bowel sounds; Genitourinary: No CVA tenderness; Extremities: Peripheral pulses normal, No tenderness, No edema, No calf edema or asymmetry.; Neuro: AA&Ox3, Major CN grossly intact.  Speech clear. No gross focal motor or sensory deficits in extremities.; Skin: Color jaundiced, Warm, Dry.   ED Treatments / Results  Labs (all labs ordered are listed,  but only abnormal results are displayed)   EKG EKG Interpretation  Date/Time:  Sunday February 06 2018 13:58:01 EDT Ventricular Rate:  98 PR Interval:    QRS Duration: 95 QT Interval:  340 QTC Calculation: 435 R Axis:   82 Text Interpretation:  Sinus rhythm Borderline short PR interval No old tracing to compare Confirmed by Samuel Jester [id number]) on 02/06/2018 2:12:57 PM   Radiology   Procedures Procedures (including critical care time)  Medications Ordered in ED Medications  iopamidol (ISOVUE-300) 61 % injection 100 mL (has no administration in time range)  sodium chloride 0.9 % bolus 1,000 mL (has no administration in time range)  0.9 %  sodium chloride infusion (has no administration in time range)     Initial Impression / Assessment and Plan / ED Course  I have reviewed the triage vital signs and the nursing notes.  Pertinent labs & imaging results that were available during my care of the patient were reviewed by me and considered in my medical decision making (see chart for details).  MDM Reviewed: previous chart, nursing note and vitals Reviewed previous: labs Interpretation: labs, ECG, x-ray and CT scan    Results for orders placed or performed during the hospital encounter of 02/06/18  Comprehensive metabolic panel  Result Value Ref Range   Sodium 133 (L) 135 - 145 mmol/L   Potassium 4.1 3.5 - 5.1 mmol/L   Chloride 101 101 - 111 mmol/L   CO2 21 (L) 22 - 32 mmol/L   Glucose, Bld 176 (H) 65 - 99 mg/dL   BUN 35 (H) 6 - 20 mg/dL   Creatinine, Ser 1.91 (H) 0.61 - 1.24 mg/dL   Calcium 9.7 8.9 - 47.8 mg/dL   Total Protein 8.1 6.5 - 8.1 g/dL   Albumin 3.7 3.5 - 5.0 g/dL   AST 295 (H) 15 - 41 U/L   ALT 294 (H) 17 - 63 U/L   Alkaline Phosphatase 193 (H) 38 - 126 U/L   Total Bilirubin 14.9 (H) 0.3 - 1.2 mg/dL   GFR calc non Af Amer 41 (L) >60 mL/min   GFR calc Af Amer 47 (L) >60 mL/min   Anion gap 11  5 - 15  Lipase, blood  Result Value Ref Range   Lipase 44  11 - 51 U/L  CBC with Differential  Result Value Ref Range   WBC 8.2 4.0 - 10.5 K/uL   RBC 4.49 4.22 - 5.81 MIL/uL   Hemoglobin 13.9 13.0 - 17.0 g/dL   HCT 69.6 29.5 - 28.4 %   MCV 89.8 78.0 - 100.0 fL   MCH 31.0 26.0 - 34.0 pg   MCHC 34.5 30.0 - 36.0 g/dL   RDW 13.2 44.0 - 10.2 %   Platelets 262 150 - 400 K/uL   Neutrophils Relative % 71 %   Neutro Abs 5.8 1.7 - 7.7 K/uL   Lymphocytes Relative 16 %   Lymphs Abs 1.3 0.7 - 4.0 K/uL   Monocytes Relative 7 %   Monocytes Absolute 0.6 0.1 - 1.0 K/uL   Eosinophils Relative 5 %   Eosinophils Absolute 0.4 0.0 - 0.7 K/uL   Basophils Relative 1 %   Basophils Absolute 0.1 0.0 - 0.1 K/uL  Protime-INR  Result Value Ref Range   Prothrombin Time 13.6 11.4 - 15.2 seconds   INR 1.05   Urinalysis, Routine w reflex microscopic  Result Value Ref Range   Color, Urine AMBER (A) YELLOW   APPearance HAZY (A) CLEAR   Specific Gravity, Urine 1.019 1.005 - 1.030   pH 5.0 5.0 - 8.0   Glucose, UA NEGATIVE NEGATIVE mg/dL   Hgb urine dipstick NEGATIVE NEGATIVE   Bilirubin Urine MODERATE (A) NEGATIVE   Ketones, ur NEGATIVE NEGATIVE mg/dL   Protein, ur 30 (A) NEGATIVE mg/dL   Nitrite NEGATIVE NEGATIVE   Leukocytes, UA NEGATIVE NEGATIVE   RBC / HPF 0-5 0 - 5 RBC/hpf   WBC, UA 0-5 0 - 5 WBC/hpf   Bacteria, UA NONE SEEN NONE SEEN   Squamous Epithelial / LPF 0-5 0 - 5   Mucus PRESENT    Hyaline Casts, UA PRESENT   Troponin I  Result Value Ref Range   Troponin I <0.03 <0.03 ng/mL   Dg Chest 2 View Result Date: 02/06/2018 CLINICAL DATA:  Weakness and jaundice EXAM: CHEST - 2 VIEW COMPARISON:  None. FINDINGS: The heart size and mediastinal contours are within normal limits. Both lungs are clear. The visualized skeletal structures are unremarkable. IMPRESSION: No active cardiopulmonary disease. Electronically Signed   By: Alcide Clever M.D.   On: 02/06/2018 14:20   Ct Abdomen Pelvis Wo Contrast Result Date: 02/06/2018 CLINICAL DATA:  Jaundice for 3 days.  EXAM: CT ABDOMEN AND PELVIS WITHOUT CONTRAST TECHNIQUE: Multidetector CT imaging of the abdomen and pelvis was performed following the standard protocol without IV contrast. COMPARISON:  CT abdomen and pelvis 08/10/2016. FINDINGS: Lower chest: Lung bases are clear. No pleural or pericardial effusion. Heart size is normal. Hepatobiliary: The liver is diffusely low attenuating. The liver border appears smooth. No hypertrophy of the caudate lobe or atrophy of the remainder of the liver is identified. The gallbladder is distended with stones and sludge present. The gallbladder wall appears thickened at 0.6 cm and there may be a small amount of pericholecystic fluid. A stone is seen in the neck of the gallbladder. Biliary tree is unremarkable. Pancreas: Unremarkable. No pancreatic ductal dilatation or surrounding inflammatory changes. Spleen: Normal in size without focal abnormality. Adrenals/Urinary Tract: Adrenal glands are unremarkable. Kidneys are normal, without renal calculi, focal lesion, or hydronephrosis. Bladder is unremarkable. Stomach/Bowel: Stomach is within normal limits. Appendix appears normal. No evidence of bowel wall thickening, distention, or  inflammatory changes. Vascular/Lymphatic: No significant vascular findings are present. No enlarged abdominal or pelvic lymph nodes. Reproductive: Prostate is unremarkable. Other: No ascites.  No hernia. Musculoskeletal: No acute or focal abnormality. Degenerative disc disease lower lumbar spine noted. IMPRESSION: Gallstones, sludge and gallbladder wall thickening worrisome for cholecystitis. Right upper quadrant ultrasound is recommended for further evaluation. Diffuse fatty infiltration of the liver. Electronically Signed   By: Drusilla Kanner M.D.   On: 02/06/2018 14:59    Results for JAYVEION, STALLING (MRN [id number]) as of 02/06/2018 14:25  Ref. Range 01/28/2018 15:28 02/06/2018 13:47  BUN Latest Ref Range: 6 - 20 mg/dL 11 35 (H)  Creatinine Latest Ref  Range: 0.61 - 1.24 mg/dL 1.30 8.65 (H)  Alkaline Phosphatase Latest Ref Range: 38 - 126 U/L 43 193 (H)  AST Latest Ref Range: 15 - 41 U/L 31 143 (H)  ALT Latest Ref Range: 17 - 63 U/L 48 (H) 294 (H)  Total Bilirubin Latest Ref Range: 0.3 - 1.2 mg/dL 0.5 78.4 (H)    6962:  Pt orthostatic on VS; IVF bolus given. LFT's elevated with cholecystitis on CT scan. Pt's abd remains benign, resps easy, NAD.  T/C returned from General Surgery Dr. Lovell Sheehan, case discussed, including:  HPI, pertinent PM/SHx, VS/PE, dx testing, ED course and treatment:  Pt will need GI MD consult tomorrow for MRCP, start IV zosyn, admit [facility] service.  1425:  Dx and testing, as well as d/w General Surgeon, d/w pt and family.  Questions answered.  Verb understanding, agreeable to admit. T/C returned from [facility] Dr. Adrian Blackwater, case discussed, including:  HPI, pertinent PM/SHx, VS/PE, dx testing, ED course and treatment:  Agreeable to admit.      Final Clinical Impressions(s) / ED Diagnoses   Final diagnoses:  None    ED Discharge Orders    None       Samuel Jester, DO 02/09/18 1426

## 2018-02-06 NOTE — ED Notes (Signed)
Attempted to call report to 3rd floor. Nurse unavailable at this time.

## 2018-02-07 ENCOUNTER — Encounter (HOSPITAL_COMMUNITY): Payer: Self-pay | Admitting: Gastroenterology

## 2018-02-07 ENCOUNTER — Inpatient Hospital Stay (HOSPITAL_COMMUNITY): Payer: 59

## 2018-02-07 ENCOUNTER — Encounter (HOSPITAL_COMMUNITY): Payer: Self-pay | Admitting: Anesthesiology

## 2018-02-07 DIAGNOSIS — R945 Abnormal results of liver function studies: Secondary | ICD-10-CM

## 2018-02-07 DIAGNOSIS — K8000 Calculus of gallbladder with acute cholecystitis without obstruction: Secondary | ICD-10-CM

## 2018-02-07 LAB — GLUCOSE, CAPILLARY
GLUCOSE-CAPILLARY: 86 mg/dL (ref 65–99)
GLUCOSE-CAPILLARY: 100 mg/dL — AB (ref 65–99)
Glucose-Capillary: 118 mg/dL — ABNORMAL HIGH (ref 65–99)
Glucose-Capillary: 99 mg/dL (ref 65–99)
Glucose-Capillary: 159 mg/dL — ABNORMAL HIGH (ref 65–99)

## 2018-02-07 LAB — COMPREHENSIVE METABOLIC PANEL
ALBUMIN: 3.3 g/dL — AB (ref 3.5–5.0)
ALT: 251 U/L — AB (ref 17–63)
ANION GAP: 8 (ref 5–15)
AST: 129 U/L — ABNORMAL HIGH (ref 15–41)
Alkaline Phosphatase: 201 U/L — ABNORMAL HIGH (ref 38–126)
BUN: 26 mg/dL — ABNORMAL HIGH (ref 6–20)
CALCIUM: 9.3 mg/dL (ref 8.9–10.3)
CO2: 23 mmol/L (ref 22–32)
Chloride: 105 mmol/L (ref 101–111)
Creatinine, Ser: 1.27 mg/dL — ABNORMAL HIGH (ref 0.61–1.24)
GFR calc Af Amer: >60 mL/min (ref >60)
GFR calc non Af Amer: >60 mL/min (ref >60)
GLUCOSE: 96 mg/dL (ref 65–99)
Potassium: 4.6 mmol/L (ref 3.5–5.1)
SODIUM: 136 mmol/L (ref 135–145)
Total Bilirubin: 14.2 mg/dL — ABNORMAL HIGH (ref 0.3–1.2)
Total Protein: 7.3 g/dL (ref 6.5–8.1)

## 2018-02-07 LAB — LIPID PANEL
Cholesterol: 371 mg/dL — ABNORMAL HIGH (ref 0–200)
Triglycerides: 338 mg/dL — ABNORMAL HIGH (ref <150)
VLDL: 68 mg/dL — AB (ref 0–40)

## 2018-02-07 LAB — SURGICAL PCR SCREEN
MRSA, PCR: NEGATIVE
Staphylococcus aureus: NEGATIVE

## 2018-02-07 MED ORDER — ALUM & MAG HYDROXIDE-SIMETH 200-200-20 MG/5ML PO SUSP
15.0000 mL | ORAL | Status: DC | PRN
  Administered 2018-02-07: 15 mL via ORAL
  Filled 2018-02-07 (×2): qty 30

## 2018-02-07 MED ORDER — LOSARTAN POTASSIUM 50 MG PO TABS: 50.0000 mg | ORAL_TABLET | Freq: Every day | ORAL | Status: DC

## 2018-02-07 MED ORDER — KCL IN DEXTROSE-NACL 20-5-0.45 MEQ/L-%-% IV SOLN
INTRAVENOUS | Status: DC
  Administered 2018-02-07 – 2018-02-09 (×4): via INTRAVENOUS
  Administered 2018-02-10: 1 mL via INTRAVENOUS
  Administered 2018-02-10 – 2018-02-12 (×4): via INTRAVENOUS
  Filled 2018-02-07 (×7): qty 1000

## 2018-02-07 MED ORDER — PRAVASTATIN SODIUM 40 MG PO TABS: 40.0000 mg | ORAL_TABLET | Freq: Every day | ORAL | Status: DC

## 2018-02-07 MED ORDER — POTASSIUM CHLORIDE 2 MEQ/ML IV SOLN: INTRAVENOUS | Status: DC

## 2018-02-07 MED ORDER — ALLOPURINOL 300 MG PO TABS
300.0000 mg | ORAL_TABLET | Freq: Every day | ORAL | Status: DC
  Administered 2018-02-07 – 2018-02-11 (×4): 300 mg via ORAL
  Filled 2018-02-07 (×4): qty 1

## 2018-02-07 MED ORDER — SIMETHICONE 80 MG PO CHEW
80.0000 mg | CHEWABLE_TABLET | Freq: Once | ORAL | Status: AC
  Administered 2018-02-07: 80 mg via ORAL
  Filled 2018-02-07: qty 1

## 2018-02-07 MED ORDER — PIPERACILLIN-TAZOBACTAM 3.375 G IVPB
3.3750 g | Freq: Three times a day (TID) | INTRAVENOUS | Status: DC
  Administered 2018-02-07 – 2018-02-11 (×12): 3.375 g via INTRAVENOUS
  Filled 2018-02-07 (×15): qty 50

## 2018-02-07 MED ORDER — PIPERACILLIN-TAZOBACTAM 3.375 G IVPB
3.3750 g | Freq: Once | INTRAVENOUS | Status: AC
  Administered 2018-02-07: 3.375 g via INTRAVENOUS
  Filled 2018-02-07: qty 50

## 2018-02-07 NOTE — Progress Notes (Signed)
Patient reports that he cannot/willnot get in an MRI machine. He says he has horrible anxiety and cannot tolerate getting in the machine.

## 2018-02-07 NOTE — Progress Notes (Signed)
[facility].

## 2018-02-07 NOTE — Consult Note (Addendum)
Gregory Thompson: 8:23 AM 02/08/2018  LOS: 2 days    Referring Provider: Dr Roderic Palau  Primary Care Physician:  Kathyrn Drown, MD Primary Gastroenterologist:  Althia Forts transfer from [facility].  Remote pt of Dr Olevia Perches.  Dr Oneida Alar saw pt 6/3.      Reason for Consultation:  Suspicion for Choledocholithiasis.     HPI: Gregory Thompson is a 52 y.o. male.  Hx type 2 DM.  HTN.  HLD/hypertriglyceridemia.  Kidney stones.  IBS.  Gout.  Anxiety.  GERD.  10/28/15 RUQ ultrasound: Fatty liver and gallstones/sludge.  S/p liver biopsy in [facility] ~ 2000.   Tends to have elevated LFTs.   Remote colon/EGD per Dr Laural Golden, ~ 2000.       Routine lab work after 5/24 visit with PMD showed ALT slightly elevated at 48, o/w normal LFTs.  Apparently soon after OV he developed N/V, abd pain lasting ~ 3 days.  Acute sxs resolved but ongoing anorexia, weakness, malaise.  Sustained 8 # wt loss.  Over weekend developed jaundice.   t bil 14.9.  Alk phos 193.  AST/ALT 143/294.  Lipase 44.   Acute hepatitis ABC serologies all negative AKI improved with IVF,   02/06/18 CT ab/pelvis with contrast:  Gallstones, sludge and gallbladder wall thickening worrisome for cholecystitis. Stone in neck of GB.  Unremarkable biliary tree. Ultrasound recommended.  Ultrasound:  Multiple gallstones, mild GB wall thickening and distention without positive sonographic Murphy's sign, likely reflects a subacute or chronic inflammation. Additionally, intrahepatic and common bile ductal dilation. The possibility of a distal CBD stone or distal common bile duct obstructive process is raised. MRCP now would be a useful next imaging step.  Fatty infiltrative changes of the liver. No definite cirrhotic changes by ultrasound. Unable to get MRCP in [place], as pt too anxious and  requiring sedation for procedure.   Transferred to [facility] for sedated MRCP vs ERCP.      No ETOH or IVDA.  + Smoker.  Mother s/p cholecystectomy.      Past Medical History:  Diagnosis Date  . Fatty liver   . Gout   . Hx of gallstones   . Hypertension   . Hypertriglyceridemia   . Kidney stone   . Liver cirrhosis ([place]) 1997   per patient: imaging does not indicate this as of 2019  . Prediabetes     Past Surgical History:  Procedure Laterality Date  . COLONOSCOPY      remote past by Dr. Laural Golden 20 years ago  . ESOPHAGOGASTRODUODENOSCOPY     EGD 20 years ago  . LIVER BIOPSY    . VASECTOMY      Prior to Admission medications   Medication Sig Start Date End Date Taking? Authorizing Provider  acetaminophen (TYLENOL ARTHRITIS PAIN) 650 MG CR tablet Take 650 mg by mouth every 8 (eight) hours as needed for pain.   Yes [provider]  allopurinol (ZYLOPRIM) 300 MG tablet Take 1 tablet (300 mg total) by mouth daily. 01/28/18  Yes Kathyrn Drown, MD  ALPRAZolam Duanne Moron)  0.5 MG tablet TAKE ONE TABLET BY MOUTH AT BEDTIME AS NEEDED FOR SLEEP/ANXIETY 01/28/18  Yes Kathyrn Drown, MD  Blood Glucose Monitoring Suppl MISC Glucose testing supplies-test once a day 09/25/16  Yes Luking, Scott A, MD  CINNAMON PO Take 2,000 mg by mouth daily. 2,000 mg    Yes [provider]  gemfibrozil (LOPID) 600 MG tablet Take 1 tablet (600 mg total) by mouth daily. 01/28/18  Yes Luking, Elayne Snare, MD  glipiZIDE (GLUCOTROL XL) 2.5 MG 24 hr tablet Take 2 tablets (5 mg total) by mouth daily with breakfast. Patient taking differently: Take 2.5 mg by mouth daily with breakfast.  01/30/18  Yes Luking, Elayne Snare, MD  hyoscyamine (LEVBID) 0.375 MG 12 hr tablet Take 1 tablet (0.375 mg total) by mouth every 12 (twelve) hours as needed. 01/28/18  Yes Kathyrn Drown, MD  losartan (COZAAR) 50 MG tablet Take 1 tablet (50 mg total) by mouth daily. 01/28/18  Yes Luking, Elayne Snare, MD  metFORMIN (GLUCOPHAGE-XR) 500 MG 24 hr  tablet TAKE 1 TABLET BY MOUTH EVERY DAY WITH BREAKFAST 01/28/18  Yes Luking, Elayne Snare, MD  Omega-3 Fatty Acids (OMEGA-3 FISH OIL PO) Take by mouth.   Yes [provider]  ONE TOUCH ULTRA TEST test strip TEST ONCE DAILY AS DIRECTED 09/25/16  Yes Luking, Elayne Snare, MD  pantoprazole (PROTONIX) 40 MG tablet Take 1 tablet (40 mg total) by mouth 2 (two) times daily. 01/28/18  Yes Kathyrn Drown, MD  pravastatin (PRAVACHOL) 40 MG tablet Take 1 tablet (40 mg total) by mouth daily. 01/28/18  Yes Kathyrn Drown, MD  Probiotic Product (PROBIOTIC FORMULA PO) Take 1 capsule by mouth daily. PHILLIPS   Yes [provider]    Scheduled Meds: . allopurinol  300 mg Oral Daily  . enoxaparin (LOVENOX) injection  40 mg Subcutaneous Q24H  . gemfibrozil  600 mg Oral Daily  . insulin aspart  0-15 Units Subcutaneous TID WC  . insulin aspart  0-5 Units Subcutaneous QHS  . pantoprazole  40 mg Oral BID   Infusions: . dextrose 5 % and 0.45 % NaCl with KCl 20 mEq/L 75 mL/hr at 02/07/18 2353  . piperacillin-tazobactam (ZOSYN)  IV Stopped (02/08/18 0629)   PRN Meds: acetaminophen **OR** acetaminophen, ALPRAZolam, alum & mag hydroxide-simeth, hyoscyamine, iopamidol, ondansetron **OR** ondansetron (ZOFRAN) IV   Allergies as of 02/06/2018 - Review Complete 02/06/2018  Allergen Reaction Noted  . Amoxicillin-pot clavulanate Other (See Comments) 11/11/2013  . Eluxadoline Other (See Comments) 10/17/2015  . Azithromycin  11/11/2013  . Cefzil [cefprozil]  11/11/2013  . Lisinopril  09/24/2016    Family History  Problem Relation Age of Onset  . Hyperlipidemia Father   . Diabetes Paternal Grandfather   . Colon cancer Neg Hx   . Colon polyps Neg Hx   . Liver disease Neg Hx     Social History   Socioeconomic History  . Marital status: Married    Spouse name: Not on file  . Number of children: Not on file  . Years of education: Not on file  . Highest education level: Not on file  Occupational History   . Not on file  Social Needs  . Financial resource strain: Not on file  . Food insecurity:    Worry: Not on file    Inability: Not on file  . Transportation needs:    Medical: Not on file    Non-medical: Not on file  Tobacco Use  . Smoking status: Current  Every Day Smoker    Packs/day: 1.00    Types: Cigarettes  . Smokeless tobacco: Never Used  Substance and Sexual Activity  . Alcohol use: No  . Drug use: No  . Sexual activity: Not on file  Lifestyle  . Physical activity:    Days per week: Not on file    Minutes per session: Not on file  . Stress: Not on file  Relationships  . Social connections:    Talks on phone: Not on file    Gets together: Not on file    Attends religious service: Not on file    Active member of club or organization: Not on file    Attends meetings of clubs or organizations: Not on file    Relationship status: Not on file  . Intimate partner violence:    Fear of current or ex partner: Not on file    Emotionally abused: Not on file    Physically abused: Not on file    Forced sexual activity: Not on file  Other Topics Concern  . Not on file  Social History Narrative  . Not on file    REVIEW OF SYSTEMS: Constitutional: In general has no issues with weakness. ENT:  No nose bleeds Pulm: No shortness of breath.  No cough. CV:  No palpitations, no LE edema.  No chest pain. GU:  No hematuria, no frequency.  Urine has become quite dark. GI:  Per HPI.  This morning he notes that his stools were gray-colored. Heme: No unusual bleeding or bruising. Transfusions: None. Neuro/psych: insomnia.  No headaches, no peripheral tingling or numbness Derm:  No itching, no rash or sores.  Endocrine:  No sweats or chills.  No polyuria or dysuria Immunization: Not queried. Travel:  None beyond local counties in last few months.    PHYSICAL EXAM: Vital signs in last 24 hours: Vitals:   02/07/18 2115 02/08/18 0539  BP: 124/80 117/80  Pulse: 75 73  Resp: 18  18  Temp: 98.9 F (37.2 C) 98.2 F (36.8 C)  SpO2:  98%   Wt Readings from Last 3 Encounters:  02/07/18 177 lb 0.5 oz (80.3 kg)  01/28/18 183 lb 9.6 oz (83.3 kg)  07/02/17 188 lb (85.3 kg)    General: Jaundiced, comfortable, looks well other than the jaundice. Head: No facial asymmetry or swelling.  No signs of head trauma. Eyes: Scleral icterus.  No conjunctival pallor.  EOMI. Ears: Not hard of hearing. Nose: No discharge or congestion. Mouth: Tongue midline.  Oral mucosa pink, moist, clear. Neck: No JVD, no thyromegaly, no masses. Lungs: Overall reduced breath sounds especially in the upper fields but clear.  No labored breathing and no cough Heart: RRR.  No MRG.  S1, S2 present. Abdomen: Soft.  Not distended.  Active bowel sounds.  Slight tenderness in the epigastric region.  No HSM, masses, bruits, hernias..   Rectal: Deferred Musc/Skeltl: No obvious joint deformities or swelling. Extremities: No CCE. Neurologic: Oriented x3.  Fully alert.  Moves all 4 limbs.  No obvious weakness and no tremor. Skin: Jaundiced Nodes: No cervical or inguinal adenopathy. Psych: Anxious but cooperative and pleasant.  Fluid speech.  Good historian.  Intake/Output from previous day: 06/03 0701 - 06/04 0700 In: 777.5 [I.V.:677.5; IV Piggyback:100] Out: 9326 [Urine:1450] Intake/Output this shift: No intake/output data recorded.  LAB RESULTS: Recent Labs    02/06/18 1347  WBC 8.2  HGB 13.9  HCT 40.3  PLT 262   BMET Lab Results  Component  Value Date   NA 135 02/08/2018   NA 136 02/07/2018   NA 133 (L) 02/06/2018   K 4.4 02/08/2018   K 4.6 02/07/2018   K 4.1 02/06/2018   CL 106 02/08/2018   CL 105 02/07/2018   CL 101 02/06/2018   CO2 20 (L) 02/08/2018   CO2 23 02/07/2018   CO2 21 (L) 02/06/2018   GLUCOSE 128 (H) 02/08/2018   GLUCOSE 96 02/07/2018   GLUCOSE 176 (H) 02/06/2018   BUN 17 02/08/2018   BUN 26 (H) 02/07/2018   BUN 35 (H) 02/06/2018   CREATININE 1.34 (H)  02/08/2018   CREATININE 1.27 (H) 02/07/2018   CREATININE 1.86 (H) 02/06/2018   CALCIUM 9.1 02/08/2018   CALCIUM 9.3 02/07/2018   CALCIUM 9.7 02/06/2018   LFT Recent Labs    02/06/18 1347 02/07/18 0639 02/08/18 0544  PROT 8.1 7.3 7.0  ALBUMIN 3.7 3.3* 3.1*  AST 143* 129* 123*  ALT 294* 251* 223*  ALKPHOS 193* 201* 183*  BILITOT 15.0*  14.9* 14.2* 15.4*  BILIDIR 10.0*  --   --   IBILI 5.0*  --   --    PT/INR Lab Results  Component Value Date   INR 1.05 02/06/2018   Hepatitis Panel Recent Labs    02/07/18 0639  HEPBSAG Negative  HCVAB <0.1  HEPAIGM Negative  HEPBIGM Negative   C-Diff No components found for: CDIFF Lipase     Component Value Date/Time   LIPASE 44 02/06/2018 1347    Drugs of Abuse  No results found for: LABOPIA, COCAINSCRNUR, LABBENZ, AMPHETMU, THCU, LABBARB   RADIOLOGY STUDIES: Ct Abdomen Pelvis Wo Contrast  Result Date: 02/06/2018 CLINICAL DATA:  Jaundice for 3 days. EXAM: CT ABDOMEN AND PELVIS WITHOUT CONTRAST TECHNIQUE: Multidetector CT imaging of the abdomen and pelvis was performed following the standard protocol without IV contrast. COMPARISON:  CT abdomen and pelvis 08/10/2016. FINDINGS: Lower chest: Lung bases are clear. No pleural or pericardial effusion. Heart size is normal. Hepatobiliary: The liver is diffusely low attenuating. The liver border appears smooth. No hypertrophy of the caudate lobe or atrophy of the remainder of the liver is identified. The gallbladder is distended with stones and sludge present. The gallbladder wall appears thickened at 0.6 cm and there may be a small amount of pericholecystic fluid. A stone is seen in the neck of the gallbladder. Biliary tree is unremarkable. Pancreas: Unremarkable. No pancreatic ductal dilatation or surrounding inflammatory changes. Spleen: Normal in size without focal abnormality. Adrenals/Urinary Tract: Adrenal glands are unremarkable. Kidneys are normal, without renal calculi, focal lesion,  or hydronephrosis. Bladder is unremarkable. Stomach/Bowel: Stomach is within normal limits. Appendix appears normal. No evidence of bowel wall thickening, distention, or inflammatory changes. Vascular/Lymphatic: No significant vascular findings are present. No enlarged abdominal or pelvic lymph nodes. Reproductive: Prostate is unremarkable. Other: No ascites.  No hernia. Musculoskeletal: No acute or focal abnormality. Degenerative disc disease lower lumbar spine noted. IMPRESSION: Gallstones, sludge and gallbladder wall thickening worrisome for cholecystitis. Right upper quadrant ultrasound is recommended for further evaluation. Diffuse fatty infiltration of the liver. Electronically Signed   By: Inge Rise M.D.   On: 02/06/2018 14:59   Dg Chest 2 View  Result Date: 02/06/2018 CLINICAL DATA:  Weakness and jaundice EXAM: CHEST - 2 VIEW COMPARISON:  None. FINDINGS: The heart size and mediastinal contours are within normal limits. Both lungs are clear. The visualized skeletal structures are unremarkable. IMPRESSION: No active cardiopulmonary disease. Electronically Signed   By: Inez Catalina  M.D.   On: 02/06/2018 14:20   US Abdomen Limited Ruq  Result Date: 02/07/2018 CLINICAL DATA:  Hepatic cirrhosis by history. Now with elevated liver function studies, jaundice. Patient found have fatty liver changes on CT scan yesterday as well as gallstones and gallbladder wall thickening. EXAM: ULTRASOUND ABDOMEN LIMITED RIGHT UPPER QUADRANT COMPARISON:  Noncontrast abdominal and pelvic CT scan of February 06, 2018. FINDINGS: Gallbladder: The gallbladder is mildly distended. There are multiple echogenic mobile shadowing stones measuring up to 1.5 cm in diameter. There is mild gallbladder wall thickening to 4 mm. There is no positive sonographic Murphy's sign. Common bile duct: Diameter: 7 mm; no intraluminal sludge or stone is observed. Liver: The hepatic echotexture is increased. There is intrahepatic ductal dilation. The  surface contour of the liver is fairly smooth. Portal vein is patent on color Doppler imaging with normal direction of blood flow towards the liver. IMPRESSION: Multiple gallstones with mild gallbladder wall thickening and gallbladder distention without positive sonographic Murphy's sign. This likely reflects a subacute or chronic inflammation. There is also intrahepatic and common bile ductal dilation. The possibility of a distal common bile duct stone or distal common bile duct obstructive process is raised. MRCP now would be a useful next imaging step. Fatty infiltrative changes of the liver. No definite cirrhotic changes by ultrasound. Electronically Signed   By: David  Martinique M.D.   On: 02/07/2018 14:24     IMPRESSION:   *  Jaundice, nausea abd pain.  Cholelithiasis.  Suspect choledocholithiasis based on CT and ultrasound.  Suspect cholecystitis.    *   Fatty liver.  Longstanding problem.  Liver biopsy at [facility] remotely.    *   Anxiety.     PLAN:     *  ERCP set for 1300 today.   Canceled MRCP.    *  Will need gen surgery Thompson as well.     Azucena Freed  02/08/2018, 8:23 AM Phone [phone]   I have reviewed the entire case in detail with the above APP and discussed the plan in detail.  Therefore, I agree with the diagnoses recorded above. In addition,  I have personally interviewed and examined the patient and have personally reviewed any abdominal/pelvic CT scan images.  My additional thoughts are as follows:  Upper abd pain and vomiting was about a week ago, then he became jaundiced. No longer has RUQ pain or tenderness. Gallstones in enlarged, distended GB; CBD 49m; jaundice and all LFTs up.  Highly suggestive of choledocholithiasis.  History of fatty liver and prior Bx.  Neg recent viral hep panel and HIV test.  No alcohol use, < 2000 mg daily tylenol use.  Possible intrinsic liver disease, possible recent viral illness like EBV or CMV.  ERCP today to determine  with certainty if choledocholithiasis and therapy if needed.  If negative for stones, will order further lab workup of liver disease.   HNelida MeuseIII Office:[phone]

## 2018-02-07 NOTE — Progress Notes (Signed)
Pharmacy Antibiotic Note  Gregory BalesStephen D Thompson is a 52 y.o. male admitted on 02/06/2018 with intra-abdominal infection.  Pharmacy has been consulted for Zosyn dosing.  Plan: Zosyn 3.375g IV q8h (4 hour infusion). One time dose ordered til pharmacy can clarify if want to keep Zosyn because of amoxicillin allergy (abdominal pain). Monitor labs, c/s, and patient improvement   Height: 5\' 8"  (172.7 cm) Weight: 179 lb 7.3 oz (81.4 kg) IBW/kg (Calculated) : 68.4  Temp (24hrs), Avg:98.6 F (37 C), Min:98.4 F (36.9 C), Max:98.8 F (37.1 C)  Recent Labs  Lab 02/06/18 1347  WBC 8.2  CREATININE 1.86*    Estimated Creatinine Clearance: 46 mL/min (A) (by C-G formula based on SCr of 1.86 mg/dL (H)).    Allergies  Allergen Reactions  . Amoxicillin-Pot Clavulanate Other (See Comments)    Abdominal pain Abdominal pain  . Eluxadoline Other (See Comments)    Low blood sugar Low blood sugar  . Azithromycin     Abdominal pain  . Cefzil [Cefprozil]     Abdominal pain  . Lisinopril     Cough has a side effect    Antimicrobials this admission: Zosyn 6/2 >>      Dose adjustments this admission: N/A  Microbiology results: 6/2 BCx: pending 6/2 UCx: pending  Thank you for allowing pharmacy to be a part of this patient's care.  Gregory MooreSteven C Shiesha Thompson 02/07/2018 12:30 AM

## 2018-02-07 NOTE — Consult Note (Addendum)
Referring Provider: Dr. Nehemiah Settle  Primary Care Physician:  Kathyrn Drown, MD Primary Gastroenterologist:  Dr. Britta Mccreedy in [place] previously.   Date of Admission: 02/06/18 Date of Consultation: 02/07/18  Reason for Consultation:  Elevated LFTs   HPI:  Gregory Thompson is a 52 y.o. year old male presenting with jaundice for approximately one week. Several weeks ago noted acute upper abdominal pain, N/V, loss of appetite. Abdominal pain and nausea now resolved. Continues with loss of appetite. Presented to ED due to jaundice.   He does note intermittent upper abdominal cramping but feels similar to his chronic IBS per patient. Took a gas pill last night and went away. No constipation or diarrhea. Had decreased appetite for past several weeks and felt a little weak. No fever or chills. No further N/V.   No NSAID products. MRCP ordered today at [facility]. Patient states he is severely claustrophobic and will need to be sedated. CT abd/pelvis without contrast with gallstones, sludge, and gallbladder wall thickening, stone in neck of gallbladder. Bilirubin 14.9 on admission, elevated transaminases and alk phos. Bilirubin remains 14 this morning, no significant change in transaminases and alk phos.   States he had liver damage in the past, s/p liver biopsy at [facility] 20+ years ago. Reports secondary to triglycerides.    Colonoscopy/EGD  by Dr. Laural Golden 20 years ago. Patient believes they were normal.   Past Medical History:  Diagnosis Date  . Fatty liver   . Gout   . Hx of gallstones   . Hypertension   . Hypertriglyceridemia   . Kidney stone   . Liver cirrhosis ([place]) 1997   per patient: imaging does not indicate this as of 2019  . Prediabetes     Past Surgical History:  Procedure Laterality Date  . COLONOSCOPY      remote past by Dr. Laural Golden 20 years ago  . ESOPHAGOGASTRODUODENOSCOPY     EGD 20 years ago  . LIVER BIOPSY    . VASECTOMY      Prior to Admission medications   Medication Sig  Start Date End Date Taking? Authorizing Provider  acetaminophen (TYLENOL ARTHRITIS PAIN) 650 MG CR tablet Take 650 mg by mouth every 8 (eight) hours as needed for pain.   Yes [provider]  allopurinol (ZYLOPRIM) 300 MG tablet Take 1 tablet (300 mg total) by mouth daily. 01/28/18  Yes Kathyrn Drown, MD  ALPRAZolam Duanne Moron) 0.5 MG tablet TAKE ONE TABLET BY MOUTH AT BEDTIME AS NEEDED FOR SLEEP/ANXIETY 01/28/18  Yes Kathyrn Drown, MD  Blood Glucose Monitoring Suppl MISC Glucose testing supplies-test once a day 09/25/16  Yes Luking, Scott A, MD  CINNAMON PO Take 2,000 mg by mouth daily. 2,000 mg    Yes [provider]  gemfibrozil (LOPID) 600 MG tablet Take 1 tablet (600 mg total) by mouth daily. 01/28/18  Yes Luking, Elayne Snare, MD  glipiZIDE (GLUCOTROL XL) 2.5 MG 24 hr tablet Take 2 tablets (5 mg total) by mouth daily with breakfast. Patient taking differently: Take 2.5 mg by mouth daily with breakfast.  01/30/18  Yes Luking, Elayne Snare, MD  hyoscyamine (LEVBID) 0.375 MG 12 hr tablet Take 1 tablet (0.375 mg total) by mouth every 12 (twelve) hours as needed. 01/28/18  Yes Kathyrn Drown, MD  losartan (COZAAR) 50 MG tablet Take 1 tablet (50 mg total) by mouth daily. 01/28/18  Yes Luking, Scott A, MD  metFORMIN (GLUCOPHAGE-XR) 500 MG 24 hr tablet TAKE 1 TABLET BY MOUTH EVERY  DAY WITH BREAKFAST 01/28/18  Yes Luking, Elayne Snare, MD  Omega-3 Fatty Acids (OMEGA-3 FISH OIL PO) Take by mouth.   Yes [provider]  ONE TOUCH ULTRA TEST test strip TEST ONCE DAILY AS DIRECTED 09/25/16  Yes Luking, Elayne Snare, MD  pantoprazole (PROTONIX) 40 MG tablet Take 1 tablet (40 mg total) by mouth 2 (two) times daily. 01/28/18  Yes Kathyrn Drown, MD  pravastatin (PRAVACHOL) 40 MG tablet Take 1 tablet (40 mg total) by mouth daily. 01/28/18  Yes Kathyrn Drown, MD  Probiotic Product (PROBIOTIC FORMULA PO) Take 1 capsule by mouth daily. PHILLIPS   Yes [provider]    Current Facility-Administered  Medications  Medication Dose Route Frequency Provider Last Rate Last Dose  . 0.9 %  sodium chloride infusion   Intravenous Continuous Truett Mainland, DO 100 mL/hr at 02/06/18 1449    . acetaminophen (TYLENOL) tablet 650 mg  650 mg Oral Q6H PRN Truett Mainland, DO       Or  . acetaminophen (TYLENOL) suppository 650 mg  650 mg Rectal Q6H PRN Truett Mainland, DO      . allopurinol (ZYLOPRIM) tablet 300 mg  300 mg Oral Daily Truett Mainland, DO   300 mg at 02/06/18 2036  . ALPRAZolam Duanne Moron) tablet 0.5 mg  0.5 mg Oral QHS PRN Truett Mainland, DO   0.5 mg at 02/06/18 2134  . enoxaparin (LOVENOX) injection 40 mg  40 mg Subcutaneous Q24H Truett Mainland, DO   40 mg at 02/06/18 2134  . gemfibrozil (LOPID) tablet 600 mg  600 mg Oral Daily Truett Mainland, DO      . hyoscyamine (LEVBID) 0.375 MG 12 hr tablet 0.375 mg  0.375 mg Oral Q12H PRN Truett Mainland, DO   0.375 mg at 02/06/18 2346  . insulin aspart (novoLOG) injection 0-15 Units  0-15 Units Subcutaneous TID WC Truett Mainland, DO      . insulin aspart (novoLOG) injection 0-5 Units  0-5 Units Subcutaneous QHS Stinson, Jacob J, DO      . iopamidol (ISOVUE-300) 61 % injection 100 mL  100 mL Intravenous Once PRN Truett Mainland, DO      . losartan (COZAAR) tablet 50 mg  50 mg Oral Daily Truett Mainland, DO   50 mg at 02/06/18 2036  . ondansetron (ZOFRAN) tablet 4 mg  4 mg Oral Q6H PRN Truett Mainland, DO       Or  . ondansetron [facility]) injection 4 mg  4 mg Intravenous Q6H PRN Truett Mainland, DO      . pantoprazole (PROTONIX) EC tablet 40 mg  40 mg Oral BID Truett Mainland, DO   40 mg at 02/06/18 2134  . piperacillin-tazobactam (ZOSYN) IVPB 3.375 g  3.375 g Intravenous Q8H Memon, Jolaine Artist, MD      . pravastatin (PRAVACHOL) tablet 40 mg  40 mg Oral Daily Truett Mainland, DO   40 mg at 02/06/18 2037    Allergies as of 02/06/2018 - Review Complete 02/06/2018  Allergen Reaction Noted  . Amoxicillin-pot clavulanate Other (See Comments)  11/11/2013  . Eluxadoline Other (See Comments) 10/17/2015  . Azithromycin  11/11/2013  . Cefzil [cefprozil]  11/11/2013  . Lisinopril  09/24/2016    Family History  Problem Relation Age of Onset  . Hyperlipidemia Father   . Diabetes Paternal Grandfather   . Colon cancer Neg Hx   . Colon polyps Neg Hx   .  Liver disease Neg Hx     Social History   Socioeconomic History  . Marital status: Married    Spouse name: Not on file  . Number of children: Not on file  . Years of education: Not on file  . Highest education level: Not on file  Occupational History  . Not on file  Social Needs  . Financial resource strain: Not on file  . Food insecurity:    Worry: Not on file    Inability: Not on file  . Transportation needs:    Medical: Not on file    Non-medical: Not on file  Tobacco Use  . Smoking status: Current Every Day Smoker    Packs/day: 1.00    Types: Cigarettes  . Smokeless tobacco: Never Used  Substance and Sexual Activity  . Alcohol use: No  . Drug use: No  . Sexual activity: Not on file  Lifestyle  . Physical activity:    Days per week: Not on file    Minutes per session: Not on file  . Stress: Not on file  Relationships  . Social connections:    Talks on phone: Not on file    Gets together: Not on file    Attends religious service: Not on file    Active member of club or organization: Not on file    Attends meetings of clubs or organizations: Not on file    Relationship status: Not on file  . Intimate partner violence:    Fear of current or ex partner: Not on file    Emotionally abused: Not on file    Physically abused: Not on file    Forced sexual activity: Not on file  Other Topics Concern  . Not on file  Social History Narrative  . Not on file    Review of Systems: Gen: see HPI  CV: Denies chest pain, heart palpitations, syncope, edema  Resp: Denies shortness of breath with rest, cough, wheezing GI: see HPI  GU : Denies urinary burning,  urinary frequency, urinary incontinence.  MS: Denies joint pain,swelling, cramping Derm: Denies rash, itching, dry skin Psych: Denies depression, anxiety,confusion, or memory loss Heme: Denies bruising, bleeding, and enlarged lymph nodes.  Physical Exam: Vital signs in last 24 hours: Temp:  [98.4 F (36.9 C)-98.8 F (37.1 C)] 98.5 F (36.9 C) (06/03 0550) Pulse Rate:  [81-113] 89 (06/03 0550) Resp:  [14-23] 18 (06/03 0550) BP: (107-122)/(67-85) 121/85 (06/03 0550) SpO2:  [96 %-100 %] 98 % (06/03 0550) Weight:  [176 lb (79.8 kg)-179 lb 7.3 oz (81.4 kg)] 179 lb 7.3 oz (81.4 kg) (06/02 1837)   General:   Alert,  Well-developed, well-nourished, pleasant and cooperative in NAD, jaundiced Head:  Normocephalic and atraumatic. Eyes:  +scleral icterus  Ears:  Normal auditory acuity. Nose:  No deformity, discharge,  or lesions. Mouth:  No deformity or lesions, dentition normal. Lungs:  Clear throughout to auscultation.    Heart:  S1 S2 present without murmurs  Abdomen:  +BS, slightly distended but soft, non-tender, no rebound or guarding Rectal:  Deferred  Msk:  Symmetrical without gross deformities. Normal posture. Extremities:  Without edema. Neurologic:  Alert and  oriented x4 Psych:  Alert and cooperative. Normal mood and affect.  Intake/Output from previous day: 06/02 0701 - 06/03 0700 In: 1050 [IV Piggyback:1050] Out: -  Intake/Output this shift: Total I/O In: -  Out: 1150 [Urine:1150]  Lab Results: Recent Labs    02/06/18 1347  WBC 8.2  HGB 13.9  HCT 40.3  PLT 262   BMET Recent Labs    02/06/18 1347 02/07/18 0639  NA 133* 136  K 4.1 4.6  CL 101 105  CO2 21* 23  GLUCOSE 176* 96  BUN 35* 26*  CREATININE 1.86* 1.27*  CALCIUM 9.7 9.3   LFT Recent Labs    02/06/18 1347 02/07/18 0639  PROT 8.1 7.3  ALBUMIN 3.7 3.3*  AST 143* 129*  ALT 294* 251*  ALKPHOS 193* 201*  BILITOT 15.0*  14.9* 14.2*  BILIDIR 10.0*  --   IBILI 5.0*  --    PT/INR Recent  Labs    02/06/18 1347  LABPROT 13.6  INR 1.05   Studies/Results: Ct Abdomen Pelvis Wo Contrast  Result Date: 02/06/2018 CLINICAL DATA:  Jaundice for 3 days. EXAM: CT ABDOMEN AND PELVIS WITHOUT CONTRAST TECHNIQUE: Multidetector CT imaging of the abdomen and pelvis was performed following the standard protocol without IV contrast. COMPARISON:  CT abdomen and pelvis 08/10/2016. FINDINGS: Lower chest: Lung bases are clear. No pleural or pericardial effusion. Heart size is normal. Hepatobiliary: The liver is diffusely low attenuating. The liver border appears smooth. No hypertrophy of the caudate lobe or atrophy of the remainder of the liver is identified. The gallbladder is distended with stones and sludge present. The gallbladder wall appears thickened at 0.6 cm and there may be a small amount of pericholecystic fluid. A stone is seen in the neck of the gallbladder. Biliary tree is unremarkable. Pancreas: Unremarkable. No pancreatic ductal dilatation or surrounding inflammatory changes. Spleen: Normal in size without focal abnormality. Adrenals/Urinary Tract: Adrenal glands are unremarkable. Kidneys are normal, without renal calculi, focal lesion, or hydronephrosis. Bladder is unremarkable. Stomach/Bowel: Stomach is within normal limits. Appendix appears normal. No evidence of bowel wall thickening, distention, or inflammatory changes. Vascular/Lymphatic: No significant vascular findings are present. No enlarged abdominal or pelvic lymph nodes. Reproductive: Prostate is unremarkable. Other: No ascites.  No hernia. Musculoskeletal: No acute or focal abnormality. Degenerative disc disease lower lumbar spine noted. IMPRESSION: Gallstones, sludge and gallbladder wall thickening worrisome for cholecystitis. Right upper quadrant ultrasound is recommended for further evaluation. Diffuse fatty infiltration of the liver. Electronically Signed   By: Inge Rise M.D.   On: 02/06/2018 14:59   Dg Chest 2  View  Result Date: 02/06/2018 CLINICAL DATA:  Weakness and jaundice EXAM: CHEST - 2 VIEW COMPARISON:  None. FINDINGS: The heart size and mediastinal contours are within normal limits. Both lungs are clear. The visualized skeletal structures are unremarkable. IMPRESSION: No active cardiopulmonary disease. Electronically Signed   By: Inez Catalina M.D.   On: 02/06/2018 14:20    Impression: 52 year old male presenting with jaundice and found to have gallstones, gallbladder wall thickening, and stone in gallbladder neck. He needs an MRCP for further characterization, also ruling out occult process, and is unable to complete this without sedation. Remains afebrile, and abdominal pain, N/V resolved. LFTs similar to presentation yesterday. 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 May need cholecystostomy tube, ERCP prior to surgical intervention. Due to complexity of clinical presentation, will be best served in [place].   Plan: Remain NPO except for sips with meds  Continue Zosyn MRCP ordered Surgery consulted To be transferred to [facility]    Annitta Needs, PhD, ANP-BC [facility]      LOS: 1 day    02/07/2018, 9:12 AM

## 2018-02-07 NOTE — Progress Notes (Signed)
Patient's password is "[email or web address]". He wants no information given to anyone who does not know his password.

## 2018-02-07 NOTE — Progress Notes (Addendum)
PROGRESS NOTE    Gregory Thompson  ZOX:[id number] DOB: 05-19-1966 DOA: 02/06/2018 PCP: Babs Sciara, MD    Brief Narrative:  52 year old male with a history of hypertension and diabetes, presents to the hospital with complaints of jaundice.  He had nausea, vomiting and diarrhea which have since improved.  On arrival to the emergency room, he was noted to have a bilirubin of almost 15.  He was dehydrated with a creatinine of 1.8.  Imaging indicated evidence of cholelithiasis and sludge as well as stone in the neck of the gallbladder.  He was seen by general surgery and gastroenterology who felt that he would need his bile duct decompressed prior to any surgical intervention.  His bilirubin will need to be trending down prior to cholecystectomy.  He will need further imaging including MRCP.  Due to severe claustrophobia, this will indeed be performed under general anesthesia.  Also may need percutaneous transhepatic biliary drainage versus ERCP with stenting for decompression of bile duct prior to any surgery.  Since these procedures cannot be performed to any pain, recommendations were to transfer the patient to [facility].  Case was discussed with GI at [facility], who will see the patient on transfer.  Arrangements have been made with anesthesia for MRI.  Case discussed with Dr. Ashley Royalty who has accepted the patient.   Assessment & Plan:   Principal Problem:   Elevated LFTs Active Problems:   GERD (gastroesophageal reflux disease)   Tobacco abuse   Hypertriglyceridemia   Irritable bowel syndrome   Type 2 diabetes mellitus (HCC)   HTN (hypertension)   Acute renal injury (HCC)   Acute cholecystitis due to biliary calculus   1. Acute cholecystitis with biliary calculus.  CT of the abdomen show the gallbladder is distended with stones and sludge.  A stone was also seen in the neck of the gallbladder.  Bilirubin is elevated at 14.  He is been started on empiric antibiotics.  He is not having pain at  this time.  Case discussed extensively with gastroenterology and general surgery.  Prior to cholecystectomy, patient will need to have bile duct decompressed and for his bilirubin to trend down.  May need to have percutaneous transhepatic biliary drainage versus ERCP with stenting.  Neither of these procedures can be performed at [facility].  He also needs further imaging with MRCP and due to extreme claustrophobia, this will need to be performed under general anesthesia.  For these reasons, GI and general surgery have recommended transfer to [facility] for further evaluation. 2. Acute kidney injury.  Likely related to dehydration.  Improving with IV fluids.  Hold ARB. 3. Diabetes.  Oral agents currently on hold.  Since the patient is n.p.o., will start on dextrose infusion.  Continue on sliding scale. 4. Hyperlipidemia.  Statin currently on hold due to elevated LFTs 5. Hypertension.  Blood pressure currently stable.  Losartan currently on hold due to elevated creatinine 6. Irritable bowel. continue on hyoscamine 7. GERD.  Continue Protonix   DVT prophylaxis: lovenox Code Status: full code Family Communication: discussed with wife at the bedside Disposition Plan: transfer to [facility] for further management   Consultants:   GI  Gen surgery  Procedures:     Antimicrobials:   Zosyn 6/2>   Subjective: No abdominal pain at this time, no vomiting  Objective: Vitals:   02/06/18 1630 02/06/18 1837 02/06/18 2153 02/07/18 0550  BP: 122/78 115/70 110/67 121/85  Pulse: 84 87 81 89  Resp: (!)  21 18 18 18   Temp:  98.7 F (37.1 C) 98.8 F (37.1 C) 98.5 F (36.9 C)  TempSrc:   Oral Oral  SpO2: 99% 100% 99% 98%  Weight:  81.4 kg (179 lb 7.3 oz)    Height:  5\' 8"  (1.727 m)      Intake/Output Summary (Last 24 hours) at 02/07/2018 0944 Last data filed at 02/07/2018 0719 Gross per 24 hour  Intake 1050 ml  Output 1150 ml  Net -100 ml   Filed Weights   02/06/18 1319 02/06/18 1837    Weight: 79.8 kg (176 lb) 81.4 kg (179 lb 7.3 oz)    Examination:  General exam: Appears calm and comfortable  Respiratory system: Clear to auscultation. Respiratory effort normal. Cardiovascular system: S1 & S2 heard, RRR. No JVD, murmurs, rubs, gallops or clicks. No pedal edema. Gastrointestinal system: Abdomen is nondistended, soft and nontender. No organomegaly or masses felt. Normal bowel sounds heard. Central nervous system: Alert and oriented. No focal neurological deficits. Extremities: Symmetric 5 x 5 power. Skin: No rashes, lesions or ulcers +jaundice Psychiatry: Judgement and insight appear normal. Mood & affect appropriate.     Data Reviewed: I have personally reviewed following labs and imaging studies  CBC: Recent Labs  Lab 02/06/18 1347  WBC 8.2  NEUTROABS 5.8  HGB 13.9  HCT 40.3  MCV 89.8  PLT 262   Basic Metabolic Panel: Recent Labs  Lab 02/06/18 1347 02/07/18 0639  NA 133* 136  K 4.1 4.6  CL 101 105  CO2 21* 23  GLUCOSE 176* 96  BUN 35* 26*  CREATININE 1.86* 1.27*  CALCIUM 9.7 9.3   GFR: Estimated Creatinine Clearance: 67.3 mL/min (A) (by C-G formula based on SCr of 1.27 mg/dL (H)). Liver Function Tests: Recent Labs  Lab 02/06/18 1347 02/07/18 0639  AST 143* 129*  ALT 294* 251*  ALKPHOS 193* 201*  BILITOT 15.0*  14.9* 14.2*  PROT 8.1 7.3  ALBUMIN 3.7 3.3*   Recent Labs  Lab 02/06/18 1347  LIPASE 44   No results for input(s): AMMONIA in the last 168 hours. Coagulation Profile: Recent Labs  Lab 02/06/18 1347  INR 1.05   Cardiac Enzymes: Recent Labs  Lab 02/06/18 1347  TROPONINI <0.03   BNP (last 3 results) No results for input(s): PROBNP in the last 8760 hours. HbA1C: No results for input(s): HGBA1C in the last 72 hours. CBG: Recent Labs  Lab 02/06/18 2155 02/07/18 0752  GLUCAP 124* 86   Lipid Profile: Recent Labs    02/07/18 0639  CHOL 371*  HDL <10*  LDLCALC PENDING  TRIG 338*  CHOLHDL PENDING    Thyroid Function Tests: No results for input(s): TSH, T4TOTAL, FREET4, T3FREE, THYROIDAB in the last 72 hours. Anemia Panel: No results for input(s): VITAMINB12, FOLATE, FERRITIN, TIBC, IRON, RETICCTPCT in the last 72 hours. Sepsis Labs: No results for input(s): PROCALCITON, LATICACIDVEN in the last 168 hours.  Recent Results (from the past 240 hour(s))  Surgical pcr screen     Status: None   Collection Time: 02/06/18 10:05 PM  Result Value Ref Range Status   MRSA, PCR NEGATIVE NEGATIVE Final   Staphylococcus aureus NEGATIVE NEGATIVE Final    Comment: (NOTE) The Xpert SA Assay (FDA approved for NASAL specimens in patients 58 years of age and older), is one component of a comprehensive surveillance program. It is not intended to diagnose infection nor to guide or monitor treatment. Performed at [facility], [address], [place], [place] [address]  Radiology Studies: Ct Abdomen Pelvis Wo Contrast  Result Date: 02/06/2018 CLINICAL DATA:  Jaundice for 3 days. EXAM: CT ABDOMEN AND PELVIS WITHOUT CONTRAST TECHNIQUE: Multidetector CT imaging of the abdomen and pelvis was performed following the standard protocol without IV contrast. COMPARISON:  CT abdomen and pelvis 08/10/2016. FINDINGS: Lower chest: Lung bases are clear. No pleural or pericardial effusion. Heart size is normal. Hepatobiliary: The liver is diffusely low attenuating. The liver border appears smooth. No hypertrophy of the caudate lobe or atrophy of the remainder of the liver is identified. The gallbladder is distended with stones and sludge present. The gallbladder wall appears thickened at 0.6 cm and there may be a small amount of pericholecystic fluid. A stone is seen in the neck of the gallbladder. Biliary tree is unremarkable. Pancreas: Unremarkable. No pancreatic ductal dilatation or surrounding inflammatory changes. Spleen: Normal in size without focal abnormality. Adrenals/Urinary Tract: Adrenal glands are  unremarkable. Kidneys are normal, without renal calculi, focal lesion, or hydronephrosis. Bladder is unremarkable. Stomach/Bowel: Stomach is within normal limits. Appendix appears normal. No evidence of bowel wall thickening, distention, or inflammatory changes. Vascular/Lymphatic: No significant vascular findings are present. No enlarged abdominal or pelvic lymph nodes. Reproductive: Prostate is unremarkable. Other: No ascites.  No hernia. Musculoskeletal: No acute or focal abnormality. Degenerative disc disease lower lumbar spine noted. IMPRESSION: Gallstones, sludge and gallbladder wall thickening worrisome for cholecystitis. Right upper quadrant ultrasound is recommended for further evaluation. Diffuse fatty infiltration of the liver. Electronically Signed   By: Drusilla Kannerhomas  Dalessio M.D.   On: 02/06/2018 14:59   Dg Chest 2 View  Result Date: 02/06/2018 CLINICAL DATA:  Weakness and jaundice EXAM: CHEST - 2 VIEW COMPARISON:  None. FINDINGS: The heart size and mediastinal contours are within normal limits. Both lungs are clear. The visualized skeletal structures are unremarkable. IMPRESSION: No active cardiopulmonary disease. Electronically Signed   By: Alcide CleverMark  Lukens M.D.   On: 02/06/2018 14:20        Scheduled Meds: . allopurinol  300 mg Oral Daily  . enoxaparin (LOVENOX) injection  40 mg Subcutaneous Q24H  . gemfibrozil  600 mg Oral Daily  . insulin aspart  0-15 Units Subcutaneous TID WC  . insulin aspart  0-5 Units Subcutaneous QHS  . losartan  50 mg Oral Daily  . pantoprazole  40 mg Oral BID  . pravastatin  40 mg Oral Daily   Continuous Infusions: . dextrose 5 % and 0.45 % NaCl with KCl 20 mEq/L    . piperacillin-tazobactam (ZOSYN)  IV       LOS: 1 day    Time spent: 45mins Greater than 50% of this time spent in direct contact with patient discussing importance of MRCP, recommendations from surgery and gastroenterology and coordinating care with subspecialties to transfer patient to  higher level of care.    Erick BlinksJehanzeb Nachman Sundt, MD [facility] Pager [phone]  If 7PM-7AM, please contact night-coverage [email or web address] Password [email or web address] 02/07/2018, 9:44 AM

## 2018-02-07 NOTE — H&P (View-Only) (Signed)
[name]: 8:23 AM 02/08/2018  LOS: 2 days    Referring Provider: Dr Roderic Palau  Primary Care Physician:  Kathyrn Drown, MD Primary Gastroenterologist:  Althia Forts transfer from [facility].  Remote pt of Dr Olevia Perches.  Dr Oneida Alar saw pt 6/3.      Reason for Consultation:  Suspicion for Choledocholithiasis.     HPI: Gregory Thompson is a 52 y.o. male.  Hx type 2 DM.  HTN.  HLD/hypertriglyceridemia.  Kidney stones.  IBS.  Gout.  Anxiety.  GERD.  10/28/15 RUQ ultrasound: Fatty liver and gallstones/sludge.  S/p liver biopsy in [facility] ~ 2000.   Tends to have elevated LFTs.   Remote colon/EGD per Dr Laural Golden, ~ 2000.       Routine lab work after 5/24 visit with PMD showed ALT slightly elevated at 48, o/w normal LFTs.  Apparently soon after OV he developed N/V, abd pain lasting ~ 3 days.  Acute sxs resolved but ongoing anorexia, weakness, malaise.  Sustained 8 # wt loss.  Over weekend developed jaundice.   t bil 14.9.  Alk phos 193.  AST/ALT 143/294.  Lipase 44.   Acute hepatitis ABC serologies all negative AKI improved with IVF,   02/06/18 CT ab/pelvis with contrast:  Gallstones, sludge and gallbladder wall thickening worrisome for cholecystitis. Stone in neck of GB.  Unremarkable biliary tree. Ultrasound recommended.  Ultrasound:  Multiple gallstones, mild GB wall thickening and distention without positive sonographic Murphy's sign, likely reflects a subacute or chronic inflammation. Additionally, intrahepatic and common bile ductal dilation. The possibility of a distal CBD stone or distal common bile duct obstructive process is raised. MRCP now would be a useful next imaging step.  Fatty infiltrative changes of the liver. No definite cirrhotic changes by ultrasound. Unable to get MRCP in [place], as pt too anxious and  requiring sedation for procedure.   Transferred to [facility] for sedated MRCP vs ERCP.      No ETOH or IVDA.  + Smoker.  Mother s/p cholecystectomy.      Past Medical History:  Diagnosis Date  . Fatty liver   . Gout   . Hx of gallstones   . Hypertension   . Hypertriglyceridemia   . Kidney stone   . Liver cirrhosis ([place]) 1997   per patient: imaging does not indicate this as of 2019  . Prediabetes     Past Surgical History:  Procedure Laterality Date  . COLONOSCOPY      remote past by Dr. Laural Golden 20 years ago  . ESOPHAGOGASTRODUODENOSCOPY     EGD 20 years ago  . LIVER BIOPSY    . VASECTOMY      Prior to Admission medications   Medication Sig Start Date End Date Taking? Authorizing Provider  acetaminophen (TYLENOL ARTHRITIS PAIN) 650 MG CR tablet Take 650 mg by mouth every 8 (eight) hours as needed for pain.   Yes [provider]  allopurinol (ZYLOPRIM) 300 MG tablet Take 1 tablet (300 mg total) by mouth daily. 01/28/18  Yes Kathyrn Drown, MD  ALPRAZolam Duanne Moron)  0.5 MG tablet TAKE ONE TABLET BY MOUTH AT BEDTIME AS NEEDED FOR SLEEP/ANXIETY 01/28/18  Yes Kathyrn Drown, MD  Blood Glucose Monitoring Suppl MISC Glucose testing supplies-test once a day 09/25/16  Yes Luking, Scott A, MD  CINNAMON PO Take 2,000 mg by mouth daily. 2,000 mg    Yes [provider]  gemfibrozil (LOPID) 600 MG tablet Take 1 tablet (600 mg total) by mouth daily. 01/28/18  Yes Luking, Elayne Snare, MD  glipiZIDE (GLUCOTROL XL) 2.5 MG 24 hr tablet Take 2 tablets (5 mg total) by mouth daily with breakfast. Patient taking differently: Take 2.5 mg by mouth daily with breakfast.  01/30/18  Yes Luking, Elayne Snare, MD  hyoscyamine (LEVBID) 0.375 MG 12 hr tablet Take 1 tablet (0.375 mg total) by mouth every 12 (twelve) hours as needed. 01/28/18  Yes Kathyrn Drown, MD  losartan (COZAAR) 50 MG tablet Take 1 tablet (50 mg total) by mouth daily. 01/28/18  Yes Luking, Elayne Snare, MD  metFORMIN (GLUCOPHAGE-XR) 500 MG 24 hr  tablet TAKE 1 TABLET BY MOUTH EVERY DAY WITH BREAKFAST 01/28/18  Yes Luking, Elayne Snare, MD  Omega-3 Fatty Acids (OMEGA-3 FISH OIL PO) Take by mouth.   Yes [provider]  ONE TOUCH ULTRA TEST test strip TEST ONCE DAILY AS DIRECTED 09/25/16  Yes Luking, Elayne Snare, MD  pantoprazole (PROTONIX) 40 MG tablet Take 1 tablet (40 mg total) by mouth 2 (two) times daily. 01/28/18  Yes Kathyrn Drown, MD  pravastatin (PRAVACHOL) 40 MG tablet Take 1 tablet (40 mg total) by mouth daily. 01/28/18  Yes Kathyrn Drown, MD  Probiotic Product (PROBIOTIC FORMULA PO) Take 1 capsule by mouth daily. PHILLIPS   Yes [provider]    Scheduled Meds: . allopurinol  300 mg Oral Daily  . enoxaparin (LOVENOX) injection  40 mg Subcutaneous Q24H  . gemfibrozil  600 mg Oral Daily  . insulin aspart  0-15 Units Subcutaneous TID WC  . insulin aspart  0-5 Units Subcutaneous QHS  . pantoprazole  40 mg Oral BID   Infusions: . dextrose 5 % and 0.45 % NaCl with KCl 20 mEq/L 75 mL/hr at 02/07/18 2353  . piperacillin-tazobactam (ZOSYN)  IV Stopped (02/08/18 0629)   PRN Meds: acetaminophen **OR** acetaminophen, ALPRAZolam, alum & mag hydroxide-simeth, hyoscyamine, iopamidol, ondansetron **OR** ondansetron (ZOFRAN) IV   Allergies as of 02/06/2018 - Review Complete 02/06/2018  Allergen Reaction Noted  . Amoxicillin-pot clavulanate Other (See Comments) 11/11/2013  . Eluxadoline Other (See Comments) 10/17/2015  . Azithromycin  11/11/2013  . Cefzil [cefprozil]  11/11/2013  . Lisinopril  09/24/2016    Family History  Problem Relation Age of Onset  . Hyperlipidemia Father   . Diabetes Paternal Grandfather   . Colon cancer Neg Hx   . Colon polyps Neg Hx   . Liver disease Neg Hx     Social History   Socioeconomic History  . Marital status: Married    Spouse name: Not on file  . Number of children: Not on file  . Years of education: Not on file  . Highest education level: Not on file  Occupational History   . Not on file  Social Needs  . Financial resource strain: Not on file  . Food insecurity:    Worry: Not on file    Inability: Not on file  . Transportation needs:    Medical: Not on file    Non-medical: Not on file  Tobacco Use  . Smoking status: Current  Every Day Smoker    Packs/day: 1.00    Types: Cigarettes  . Smokeless tobacco: Never Used  Substance and Sexual Activity  . Alcohol use: No  . Drug use: No  . Sexual activity: Not on file  Lifestyle  . Physical activity:    Days per week: Not on file    Minutes per session: Not on file  . Stress: Not on file  Relationships  . Social connections:    Talks on phone: Not on file    Gets together: Not on file    Attends religious service: Not on file    Active member of club or organization: Not on file    Attends meetings of clubs or organizations: Not on file    Relationship status: Not on file  . Intimate partner violence:    Fear of current or ex partner: Not on file    Emotionally abused: Not on file    Physically abused: Not on file    Forced sexual activity: Not on file  Other Topics Concern  . Not on file  Social History Narrative  . Not on file    REVIEW OF SYSTEMS: Constitutional: In general has no issues with weakness. ENT:  No nose bleeds Pulm: No shortness of breath.  No cough. CV:  No palpitations, no LE edema.  No chest pain. GU:  No hematuria, no frequency.  Urine has become quite dark. GI:  Per HPI.  This morning he notes that his stools were gray-colored. Heme: No unusual bleeding or bruising. Transfusions: None. Neuro/psych: insomnia.  No headaches, no peripheral tingling or numbness Derm:  No itching, no rash or sores.  Endocrine:  No sweats or chills.  No polyuria or dysuria Immunization: Not queried. Travel:  None beyond local counties in last few months.    PHYSICAL EXAM: Vital signs in last 24 hours: Vitals:   02/07/18 2115 02/08/18 0539  BP: 124/80 117/80  Pulse: 75 73  Resp: 18  18  Temp: 98.9 F (37.2 C) 98.2 F (36.8 C)  SpO2:  98%   Wt Readings from Last 3 Encounters:  02/07/18 177 lb 0.5 oz (80.3 kg)  01/28/18 183 lb 9.6 oz (83.3 kg)  07/02/17 188 lb (85.3 kg)    General: Jaundiced, comfortable, looks well other than the jaundice. Head: No facial asymmetry or swelling.  No signs of head trauma. Eyes: Scleral icterus.  No conjunctival pallor.  EOMI. Ears: Not hard of hearing. Nose: No discharge or congestion. Mouth: Tongue midline.  Oral mucosa pink, moist, clear. Neck: No JVD, no thyromegaly, no masses. Lungs: Overall reduced breath sounds especially in the upper fields but clear.  No labored breathing and no cough Heart: RRR.  No MRG.  S1, S2 present. Abdomen: Soft.  Not distended.  Active bowel sounds.  Slight tenderness in the epigastric region.  No HSM, masses, bruits, hernias..   Rectal: Deferred Musc/Skeltl: No obvious joint deformities or swelling. Extremities: No CCE. Neurologic: Oriented x3.  Fully alert.  Moves all 4 limbs.  No obvious weakness and no tremor. Skin: Jaundiced Nodes: No cervical or inguinal adenopathy. Psych: Anxious but cooperative and pleasant.  Fluid speech.  Good historian.  Intake/Output from previous day: 06/03 0701 - 06/04 0700 In: 777.5 [I.V.:677.5; IV Piggyback:100] Out: 9326 [Urine:1450] Intake/Output this shift: No intake/output data recorded.  LAB RESULTS: Recent Labs    02/06/18 1347  WBC 8.2  HGB 13.9  HCT 40.3  PLT 262   BMET Lab Results  Component  Value Date   NA 135 02/08/2018   NA 136 02/07/2018   NA 133 (L) 02/06/2018   K 4.4 02/08/2018   K 4.6 02/07/2018   K 4.1 02/06/2018   CL 106 02/08/2018   CL 105 02/07/2018   CL 101 02/06/2018   CO2 20 (L) 02/08/2018   CO2 23 02/07/2018   CO2 21 (L) 02/06/2018   GLUCOSE 128 (H) 02/08/2018   GLUCOSE 96 02/07/2018   GLUCOSE 176 (H) 02/06/2018   BUN 17 02/08/2018   BUN 26 (H) 02/07/2018   BUN 35 (H) 02/06/2018   CREATININE 1.34 (H)  02/08/2018   CREATININE 1.27 (H) 02/07/2018   CREATININE 1.86 (H) 02/06/2018   CALCIUM 9.1 02/08/2018   CALCIUM 9.3 02/07/2018   CALCIUM 9.7 02/06/2018   LFT Recent Labs    02/06/18 1347 02/07/18 0639 02/08/18 0544  PROT 8.1 7.3 7.0  ALBUMIN 3.7 3.3* 3.1*  AST 143* 129* 123*  ALT 294* 251* 223*  ALKPHOS 193* 201* 183*  BILITOT 15.0*  14.9* 14.2* 15.4*  BILIDIR 10.0*  --   --   IBILI 5.0*  --   --    PT/INR Lab Results  Component Value Date   INR 1.05 02/06/2018   Hepatitis Panel Recent Labs    02/07/18 0639  HEPBSAG Negative  HCVAB <0.1  HEPAIGM Negative  HEPBIGM Negative   C-Diff No components found for: CDIFF Lipase     Component Value Date/Time   LIPASE 44 02/06/2018 1347    Drugs of Abuse  No results found for: LABOPIA, COCAINSCRNUR, LABBENZ, AMPHETMU, THCU, LABBARB   RADIOLOGY STUDIES: Ct Abdomen Pelvis Wo Contrast  Result Date: 02/06/2018 CLINICAL DATA:  Jaundice for 3 days. EXAM: CT ABDOMEN AND PELVIS WITHOUT CONTRAST TECHNIQUE: Multidetector CT imaging of the abdomen and pelvis was performed following the standard protocol without IV contrast. COMPARISON:  CT abdomen and pelvis 08/10/2016. FINDINGS: Lower chest: Lung bases are clear. No pleural or pericardial effusion. Heart size is normal. Hepatobiliary: The liver is diffusely low attenuating. The liver border appears smooth. No hypertrophy of the caudate lobe or atrophy of the remainder of the liver is identified. The gallbladder is distended with stones and sludge present. The gallbladder wall appears thickened at 0.6 cm and there may be a small amount of pericholecystic fluid. A stone is seen in the neck of the gallbladder. Biliary tree is unremarkable. Pancreas: Unremarkable. No pancreatic ductal dilatation or surrounding inflammatory changes. Spleen: Normal in size without focal abnormality. Adrenals/Urinary Tract: Adrenal glands are unremarkable. Kidneys are normal, without renal calculi, focal lesion,  or hydronephrosis. Bladder is unremarkable. Stomach/Bowel: Stomach is within normal limits. Appendix appears normal. No evidence of bowel wall thickening, distention, or inflammatory changes. Vascular/Lymphatic: No significant vascular findings are present. No enlarged abdominal or pelvic lymph nodes. Reproductive: Prostate is unremarkable. Other: No ascites.  No hernia. Musculoskeletal: No acute or focal abnormality. Degenerative disc disease lower lumbar spine noted. IMPRESSION: Gallstones, sludge and gallbladder wall thickening worrisome for cholecystitis. Right upper quadrant ultrasound is recommended for further evaluation. Diffuse fatty infiltration of the liver. Electronically Signed   By: Inge Rise M.D.   On: 02/06/2018 14:59   Dg Chest 2 View  Result Date: 02/06/2018 CLINICAL DATA:  Weakness and jaundice EXAM: CHEST - 2 VIEW COMPARISON:  None. FINDINGS: The heart size and mediastinal contours are within normal limits. Both lungs are clear. The visualized skeletal structures are unremarkable. IMPRESSION: No active cardiopulmonary disease. Electronically Signed   By: Inez Catalina  M.D.   On: 02/06/2018 14:20   US Abdomen Limited Ruq  Result Date: 02/07/2018 CLINICAL DATA:  Hepatic cirrhosis by history. Now with elevated liver function studies, jaundice. Patient found have fatty liver changes on CT scan yesterday as well as gallstones and gallbladder wall thickening. EXAM: ULTRASOUND ABDOMEN LIMITED RIGHT UPPER QUADRANT COMPARISON:  Noncontrast abdominal and pelvic CT scan of February 06, 2018. FINDINGS: Gallbladder: The gallbladder is mildly distended. There are multiple echogenic mobile shadowing stones measuring up to 1.5 cm in diameter. There is mild gallbladder wall thickening to 4 mm. There is no positive sonographic Murphy's sign. Common bile duct: Diameter: 7 mm; no intraluminal sludge or stone is observed. Liver: The hepatic echotexture is increased. There is intrahepatic ductal dilation. The  surface contour of the liver is fairly smooth. Portal vein is patent on color Doppler imaging with normal direction of blood flow towards the liver. IMPRESSION: Multiple gallstones with mild gallbladder wall thickening and gallbladder distention without positive sonographic Murphy's sign. This likely reflects a subacute or chronic inflammation. There is also intrahepatic and common bile ductal dilation. The possibility of a distal common bile duct stone or distal common bile duct obstructive process is raised. MRCP now would be a useful next imaging step. Fatty infiltrative changes of the liver. No definite cirrhotic changes by ultrasound. Electronically Signed   By: David  Martinique M.D.   On: 02/07/2018 14:24     IMPRESSION:   *  Jaundice, nausea abd pain.  Cholelithiasis.  Suspect choledocholithiasis based on CT and ultrasound.  Suspect cholecystitis.    *   Fatty liver.  Longstanding problem.  Liver biopsy at [facility] remotely.    *   Anxiety.     PLAN:     *  ERCP set for 1300 today.   Canceled MRCP.    *  Will need gen surgery consult as well.     Azucena Freed  02/08/2018, 8:23 AM Phone [phone]   I have reviewed the entire case in detail with the above APP and discussed the plan in detail.  Therefore, I agree with the diagnoses recorded above. In addition,  I have personally interviewed and examined the patient and have personally reviewed any abdominal/pelvic CT scan images.  My additional thoughts are as follows:  Upper abd pain and vomiting was about a week ago, then he became jaundiced. No longer has RUQ pain or tenderness. Gallstones in enlarged, distended GB; CBD 49m; jaundice and all LFTs up.  Highly suggestive of choledocholithiasis.  History of fatty liver and prior Bx.  Neg recent viral hep panel and HIV test.  No alcohol use, < 2000 mg daily tylenol use.  Possible intrinsic liver disease, possible recent viral illness like EBV or CMV.  ERCP today to determine  with certainty if choledocholithiasis and therapy if needed.  If negative for stones, will order further lab workup of liver disease.   HNelida MeuseIII Office:[phone]

## 2018-02-07 NOTE — Progress Notes (Signed)
Patient requests that no changes to any medication changes be made with expressed consent from him or, Gregory EbbsSandra Thompson, his wife.

## 2018-02-07 NOTE — Progress Notes (Signed)
16[facility] Patient alert, oriented x4. No complaints at this time. Resting in bed. Will continue to monitor.

## 2018-02-08 ENCOUNTER — Inpatient Hospital Stay (HOSPITAL_COMMUNITY): Payer: 59

## 2018-02-08 ENCOUNTER — Encounter (HOSPITAL_COMMUNITY)
Admission: EM | Disposition: A | Discharge status: Home / Self Care | Payer: Self-pay | Source: Home / Self Care | Attending: Internal Medicine

## 2018-02-08 ENCOUNTER — Inpatient Hospital Stay (HOSPITAL_COMMUNITY): Payer: 59 | Admitting: Certified Registered Nurse Anesthetist

## 2018-02-08 ENCOUNTER — Encounter (HOSPITAL_COMMUNITY): Payer: Self-pay | Admitting: Certified Registered Nurse Anesthetist

## 2018-02-08 DIAGNOSIS — K760 Fatty (change of) liver, not elsewhere classified: Secondary | ICD-10-CM

## 2018-02-08 DIAGNOSIS — K8020 Calculus of gallbladder without cholecystitis without obstruction: Secondary | ICD-10-CM

## 2018-02-08 DIAGNOSIS — R17 Unspecified jaundice: Secondary | ICD-10-CM

## 2018-02-08 DIAGNOSIS — R110 Nausea: Secondary | ICD-10-CM

## 2018-02-08 DIAGNOSIS — K833 Fistula of bile duct: Secondary | ICD-10-CM

## 2018-02-08 HISTORY — PX: ENDOSCOPIC RETROGRADE CHOLANGIOPANCREATOGRAPHY (ERCP) WITH PROPOFOL: SHX5810

## 2018-02-08 HISTORY — PX: SPHINCTEROTOMY: SHX5544

## 2018-02-08 LAB — URINE CULTURE: Culture: NO GROWTH

## 2018-02-08 LAB — COMPREHENSIVE METABOLIC PANEL
ALBUMIN: 3.1 g/dL — AB (ref 3.5–5.0)
ALK PHOS: 183 U/L — AB (ref 38–126)
ALT: 223 U/L — AB (ref 17–63)
AST: 123 U/L — AB (ref 15–41)
Anion gap: 9 (ref 5–15)
BUN: 17 mg/dL (ref 6–20)
CALCIUM: 9.1 mg/dL (ref 8.9–10.3)
CO2: 20 mmol/L — ABNORMAL LOW (ref 22–32)
CREATININE: 1.34 mg/dL — AB (ref 0.61–1.24)
Chloride: 106 mmol/L (ref 101–111)
GFR calc Af Amer: >60 mL/min (ref >60)
GFR calc non Af Amer: >60 mL/min (ref >60)
GLUCOSE: 128 mg/dL — AB (ref 65–99)
Potassium: 4.4 mmol/L (ref 3.5–5.1)
Sodium: 135 mmol/L (ref 135–145)
Total Bilirubin: 15.4 mg/dL — ABNORMAL HIGH (ref 0.3–1.2)
Total Protein: 7 g/dL (ref 6.5–8.1)

## 2018-02-08 LAB — GLUCOSE, CAPILLARY
GLUCOSE-CAPILLARY: 177 mg/dL — AB (ref 65–99)
Glucose-Capillary: 133 mg/dL — ABNORMAL HIGH (ref 65–99)
Glucose-Capillary: 111 mg/dL — ABNORMAL HIGH (ref 65–99)
Glucose-Capillary: 187 mg/dL — ABNORMAL HIGH (ref 65–99)

## 2018-02-08 LAB — HEPATITIS PANEL, ACUTE
HEP B S AG: NEGATIVE
Hep A IgM: NEGATIVE
Hep B C IgM: NEGATIVE

## 2018-02-08 SURGERY — RADIOLOGY WITH ANESTHESIA
Anesthesia: Monitor Anesthesia Care | Site: Abdomen

## 2018-02-08 SURGERY — ENDOSCOPIC RETROGRADE CHOLANGIOPANCREATOGRAPHY (ERCP) WITH PROPOFOL
Anesthesia: General

## 2018-02-08 MED ORDER — MIDAZOLAM HCL 5 MG/5ML IJ SOLN
INTRAMUSCULAR | Status: DC | PRN
  Administered 2018-02-08: 2 mg via INTRAVENOUS

## 2018-02-08 MED ORDER — GLUCAGON HCL RDNA (DIAGNOSTIC) 1 MG IJ SOLR
INTRAMUSCULAR | Status: AC
  Filled 2018-02-08: qty 1

## 2018-02-08 MED ORDER — DEXAMETHASONE SODIUM PHOSPHATE 10 MG/ML IJ SOLN
INTRAMUSCULAR | Status: DC | PRN
  Administered 2018-02-08: 5 mg via INTRAVENOUS

## 2018-02-08 MED ORDER — LACTATED RINGERS IV SOLN
INTRAVENOUS | Status: DC
  Administered 2018-02-08: 12:00:00 via INTRAVENOUS

## 2018-02-08 MED ORDER — INSULIN ASPART 100 UNIT/ML ~~LOC~~ SOLN
0.0000 [IU] | Freq: Three times a day (TID) | SUBCUTANEOUS | Status: DC
  Administered 2018-02-09 – 2018-02-10 (×2): 2 [IU] via SUBCUTANEOUS
  Administered 2018-02-10 – 2018-02-12 (×3): 1 [IU] via SUBCUTANEOUS

## 2018-02-08 MED ORDER — HYOSCYAMINE SULFATE ER 0.375 MG PO TB12
0.3750 mg | ORAL_TABLET | Freq: Every day | ORAL | Status: DC
Start: 2018-02-09 — End: 2018-02-12
  Administered 2018-02-10 – 2018-02-12 (×2): 0.375 mg via ORAL
  Filled 2018-02-08 (×4): qty 1

## 2018-02-08 MED ORDER — HYDRALAZINE HCL 20 MG/ML IJ SOLN: 10.0000 mg | INTRAMUSCULAR | Status: DC | PRN

## 2018-02-08 MED ORDER — HYOSCYAMINE SULFATE ER 0.375 MG PO TB12
0.3750 mg | ORAL_TABLET | Freq: Once | ORAL | Status: AC
  Administered 2018-02-08: 0.375 mg via ORAL
  Filled 2018-02-08: qty 1

## 2018-02-08 MED ORDER — ROCURONIUM BROMIDE 100 MG/10ML IV SOLN
INTRAVENOUS | Status: DC | PRN
  Administered 2018-02-08: 40 mg via INTRAVENOUS

## 2018-02-08 MED ORDER — PROPOFOL 10 MG/ML IV BOLUS
INTRAVENOUS | Status: DC | PRN
  Administered 2018-02-08: 200 mg via INTRAVENOUS

## 2018-02-08 MED ORDER — INDOMETHACIN 50 MG RE SUPP
RECTAL | Status: DC | PRN
  Administered 2018-02-08: 100 mg via RECTAL

## 2018-02-08 MED ORDER — LIDOCAINE HCL (CARDIAC) PF 100 MG/5ML IV SOSY
PREFILLED_SYRINGE | INTRAVENOUS | Status: DC | PRN
  Administered 2018-02-08: 60 mg via INTRAVENOUS

## 2018-02-08 MED ORDER — SUGAMMADEX SODIUM 200 MG/2ML IV SOLN
INTRAVENOUS | Status: DC | PRN
  Administered 2018-02-08: 175 mg via INTRAVENOUS

## 2018-02-08 MED ORDER — INDOMETHACIN 50 MG RE SUPP
RECTAL | Status: AC
  Filled 2018-02-08: qty 2

## 2018-02-08 MED ORDER — ONDANSETRON HCL 4 MG/2ML IJ SOLN
INTRAMUSCULAR | Status: DC | PRN
  Administered 2018-02-08: 4 mg via INTRAVENOUS

## 2018-02-08 MED ORDER — INDOMETHACIN 50 MG RE SUPP: 100.0000 mg | Freq: Once | RECTAL | Status: DC

## 2018-02-08 MED ORDER — FENTANYL CITRATE (PF) 100 MCG/2ML IJ SOLN
INTRAMUSCULAR | Status: DC | PRN
  Administered 2018-02-08: 100 ug via INTRAVENOUS

## 2018-02-08 MED ORDER — IOPAMIDOL (ISOVUE-300) INJECTION 61%
INTRAVENOUS | Status: AC
  Filled 2018-02-08: qty 50

## 2018-02-08 MED ORDER — SODIUM CHLORIDE 0.9 % IV SOLN
INTRAVENOUS | Status: DC | PRN
  Administered 2018-02-08: 90 mL

## 2018-02-08 MED ORDER — HYOSCYAMINE SULFATE ER 0.375 MG PO TB12
0.3750 mg | ORAL_TABLET | Freq: Every day | ORAL | Status: DC
  Administered 2018-02-08: 0.375 mg via ORAL
  Filled 2018-02-08: qty 1

## 2018-02-08 NOTE — Interval H&P Note (Signed)
History and Physical Interval Note:  02/08/2018 1:19 PM  Gregory Thompson  has presented today for surgery, with the diagnosis of choledocholithiasis.  dilated bile duct.  jaundice  The various methods of treatment have been discussed with the patient and family. After consideration of risks, benefits and other options for treatment, the patient has consented to  Procedure(s): ENDOSCOPIC RETROGRADE CHOLANGIOPANCREATOGRAPHY (ERCP) WITH PROPOFOL (N/A) as a surgical intervention .  The patient's history has been reviewed, patient examined, no change in status, stable for surgery.  I have reviewed the patient's chart and labs.  Questions were answered to the patient's satisfaction.     Charlie PitterHenry L Danis III

## 2018-02-08 NOTE — Transfer of Care (Signed)
Immediate Anesthesia Transfer of Care Note  Patient: Gregory BalesStephen D Thompson  Procedure(s) Performed: ENDOSCOPIC RETROGRADE CHOLANGIOPANCREATOGRAPHY (ERCP) WITH PROPOFOL (N/A ) SPHINCTEROTOMY  Patient Location: Endoscopy Unit  Anesthesia Type:General  Level of Consciousness: awake, alert  and oriented  Airway & Oxygen Therapy: Patient Spontanous Breathing and Patient connected to face mask oxygen  Post-op Assessment: Report given to RN and Post -op Vital signs reviewed and stable  Post vital signs: Reviewed and stable  Last Vitals:  Vitals Value Taken Time  BP    Temp    Pulse 92 02/08/2018  2:15 PM  Resp 16 02/08/2018  2:17 PM  SpO2 100 % 02/08/2018  2:15 PM  Vitals shown include unvalidated device data.  Last Pain:  Vitals:   02/08/18 1200  TempSrc:   PainSc: 0-No pain         Complications: No apparent anesthesia complications

## 2018-02-08 NOTE — Op Note (Signed)
[facility] Patient Name: Gregory Thompson Procedure Date : 02/08/2018 MRN: [id number] Attending MD: Starr Lake. [name] , MD Date of Birth: Jun 14, 1966 CSN: [id number] Age: 52 Admit Type: Inpatient Procedure:                ERCP Indications:              Gallbladder stones, Jaundice Providers:                Sherilyn Cooter L. [name], MD, Roselie Awkward, RN, Madalyn Rob, Technician Referring MD:             Jonette Eva, MD Medicines:                General Anesthesia, Indomethacin 100 mg PR Complications:            No immediate complications. Estimated Blood Loss:     Estimated blood loss: none. Procedure:                Pre-Anesthesia Assessment:                           - Prior to the procedure, a History and Physical                            was performed, and patient medications and                            allergies were reviewed. The patient's tolerance of                            previous anesthesia was also reviewed. The risks                            and benefits of the procedure and the sedation                            options and risks were discussed with the patient.                            All questions were answered, and informed consent                            was obtained. Prior Anticoagulants: The patient has                            taken no previous anticoagulant or antiplatelet                            agents. ASA Grade Assessment: II - A patient with                            mild systemic disease. After reviewing the risks  and benefits, the patient was deemed in                            satisfactory condition to undergo the procedure.                           After obtaining informed consent, the scope was                            passed under direct vision. Throughout the                            procedure, the patient's blood pressure, pulse, and   oxygen saturations were monitored continuously. The                            Duodenoscope was introduced through the mouth, and                            used to inject contrast into and used to inject                            contrast into the bile duct. The ERCP was                            accomplished without difficulty. The patient                            tolerated the procedure well. Scope In: Scope Out: Findings:      The scout film was normal. The esophagus was successfully intubated       under direct vision. The scope was advanced to a normal major papilla in       the descending duodenum without detailed examination of the pharynx,       larynx and associated structures, and upper GI tract. The upper GI tract       was grossly normal. No bile was seen coming from the major papilla. An       0.035 inch x 260 cm straight Hydra Jagwire was passed into the biliary       tree. The traction (standard) sphincterotome was passed over the       guidewire and the bile duct was then deeply cannulated. Contrast was       injected. I personally interpreted the bile duct images. There was brisk       flow of contrast through the ducts. Image quality was excellent.       Contrast extended to the hepatic ducts. The gallbladder contained       multiple small and large stones. The common hepatic duct and left and       right hepatic ducts and all intrahepatic branches were diffusely       dilated. The common bile duct was normal: 5-6 mm and no stones or       strictures.      Initially, the intrahepatic ducts and the common hepatic duct were slow       to fill. The entirety of the gallbladder was not seen, but there were  definitely multiple large and small stones in what appeared to be the       neck of the gallbladder or a dilated cystic duct. With contrast       injection, one of those large stones moved distally, and then the CHD       and intrahepatic ducts began to opacify  better. At that point, dark bile       also began to flow freely from the papilla, and continued to do so until       the end of the procedure. Multiple images were obtained at regular and       high magnification, showing no stones or strictures in the extrahepatic       bile duct. The procedure was then terminated.The total fluoroscopy       exposure time was 5 minutes and 22 seconds. Impression:               - The left and right hepatic ducts and all                            intrahepatic branches and common hepatic duct were                            dilated.                           - Cholelithiasis was found.                           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 Recommendation:           - Surgical consult for cholecystectomy.                           Daily LFTs while hospitalized. Procedure Code(s):        --- Professional ---                           [id number], Endoscopic retrograde                            cholangiopancreatography (ERCP); diagnostic,                            including collection of specimen(s) by brushing or                            washing, when performed (separate procedure) Diagnosis Code(s):        --- Professional ---                           K80.20, Calculus of gallbladder without                            cholecystitis without obstruction  R17, Unspecified jaundice                           K83.8, Other specified diseases of biliary tract CPT copyright 2017 American Medical Association. All rights reserved. The codes documented in this report are preliminary and upon coder review may  be revised to meet current compliance requirements. [name] L. [name], MD 02/08/2018 2:26:10 PM This report has been signed electronically. Number of Addenda: 0

## 2018-02-08 NOTE — Anesthesia Procedure Notes (Signed)
Procedure Name: Intubation Date/Time: 02/08/2018 1:20 PM Performed by: Candis Shine, CRNA Pre-anesthesia Checklist: Patient identified, Emergency Drugs available, Suction available and Patient being monitored Patient Re-evaluated:Patient Re-evaluated prior to induction Oxygen Delivery Method: Circle System Utilized Preoxygenation: Pre-oxygenation with 100% oxygen Induction Type: IV induction Ventilation: Mask ventilation without difficulty Laryngoscope Size: Mac and 4 Grade View: Grade I Tube type: Oral Number of attempts: 1 Airway Equipment and Method: Stylet Placement Confirmation: ETT inserted through vocal cords under direct vision,  positive ETCO2 and breath sounds checked- equal and bilateral Secured at: 22 cm Tube secured with: Tape Dental Injury: Teeth and Oropharynx as per pre-operative assessment

## 2018-02-08 NOTE — Consult Note (Signed)
Gregory Thompson 1966/08/13  [id number].    Requesting MD: Dr. Wilfrid Lund Chief Complaint/Reason for Consult: Mirizzi's syndrome  HPI:  This is a 52 year old white male with a history of irritable bowel syndrome, hypertension, diabetes, history of fatty liver disease who saw his primary care physician 2 weeks ago was normal labs.  Later that night he ate a hamburger and then began to have emesis.  Over the next several days this persisted and he had minimal oral intake.  He then began to feel better last week and was able to go to work, although he was still not eating very much.  His wife began to notice late last week that he was becoming jaundiced.  His stepson was getting married on Saturday and so he did not seek medical treatment until after the wedding.  He continues to state that he was feeling the best he had felt in the last 2 weeks but because of persistent jaundice he presented to any pain hospital on Sunday.  He was found to have an elevation of his bilirubin at 15.  His liver function tests were also elevated.  His white blood cell count was normal.  He underwent a CT scan that revealed gallstones with gallbladder wall thickening worrisome for cholecystitis as well as diffuse fatty infiltration of the liver.  He then underwent an ultrasound of the abdomen which revealed similar findings of the CT scan also with intrahepatic and common bile duct dilatation with choledocholithiasis not being able to be ruled out.  The patient was then transferred to [facility] for ERCP.  This was performed today which did not reveal any evidence of choledocholithiasis but felt his elevated bilirubin was secondary to Mirizzi's syndrome.  We were then consulted for consideration of cholecystectomy.  ROS: ROS: Please see HPI otherwise all other systems have been reviewed and are negative.  Family History  Problem Relation Age of Onset  . Hyperlipidemia Father   . Diabetes Paternal Grandfather   .  Colon cancer Neg Hx   . Colon polyps Neg Hx   . Liver disease Neg Hx     Past Medical History:  Diagnosis Date  . Fatty liver   . Gout   . Hx of gallstones   . Hypertension   . Hypertriglyceridemia   . Kidney stone   . Liver cirrhosis ([place]) 1997   per patient: imaging does not indicate this as of 2019  . Prediabetes     Past Surgical History:  Procedure Laterality Date  . COLONOSCOPY      remote past by Dr. Laural Golden 20 years ago  . ESOPHAGOGASTRODUODENOSCOPY     EGD 20 years ago  . LIVER BIOPSY    . VASECTOMY      Social History:  reports that he has been smoking cigarettes.  He has been smoking about 1.00 pack per day. He has never used smokeless tobacco. He reports that he does not drink alcohol or use drugs.  Allergies:  Allergies  Allergen Reactions  . Amoxicillin-Pot Clavulanate Other (See Comments)    Abdominal pain Abdominal pain  . Eluxadoline Other (See Comments)    Low blood sugar Low blood sugar  . Azithromycin     Abdominal pain  . Cefzil [Cefprozil]     Abdominal pain  . Lisinopril     Cough has a side effect    Medications Prior to Admission  Medication Sig Dispense Refill  . acetaminophen (TYLENOL ARTHRITIS PAIN) 650 MG  CR tablet Take 650 mg by mouth every 8 (eight) hours as needed for pain.    Marland Kitchen allopurinol (ZYLOPRIM) 300 MG tablet Take 1 tablet (300 mg total) by mouth daily. 90 tablet 1  . ALPRAZolam (XANAX) 0.5 MG tablet TAKE ONE TABLET BY MOUTH AT BEDTIME AS NEEDED FOR SLEEP/ANXIETY 30 tablet 5  . Blood Glucose Monitoring Suppl MISC Glucose testing supplies-test once a day 50 each 11  . CINNAMON PO Take 2,000 mg by mouth daily. 2,000 mg     . gemfibrozil (LOPID) 600 MG tablet Take 1 tablet (600 mg total) by mouth daily. 90 tablet 1  . glipiZIDE (GLUCOTROL XL) 2.5 MG 24 hr tablet Take 2 tablets (5 mg total) by mouth daily with breakfast. (Patient taking differently: Take 2.5 mg by mouth daily with breakfast. ) 90 tablet 1  . hyoscyamine (LEVBID)  0.375 MG 12 hr tablet Take 1 tablet (0.375 mg total) by mouth every 12 (twelve) hours as needed. 180 tablet 1  . losartan (COZAAR) 50 MG tablet Take 1 tablet (50 mg total) by mouth daily. 90 tablet 1  . metFORMIN (GLUCOPHAGE-XR) 500 MG 24 hr tablet TAKE 1 TABLET BY MOUTH EVERY DAY WITH BREAKFAST 90 tablet 1  . Omega-3 Fatty Acids (OMEGA-3 FISH OIL PO) Take by mouth.    . ONE TOUCH ULTRA TEST test strip TEST ONCE DAILY AS DIRECTED 50 each 4  . pantoprazole (PROTONIX) 40 MG tablet Take 1 tablet (40 mg total) by mouth 2 (two) times daily. 180 tablet 1  . pravastatin (PRAVACHOL) 40 MG tablet Take 1 tablet (40 mg total) by mouth daily. 90 tablet 1  . Probiotic Product (PROBIOTIC FORMULA PO) Take 1 capsule by mouth daily. PHILLIPS       Physical Exam: Blood pressure (!) 135/95, pulse 68, temperature 98.4 F (36.9 C), temperature source Oral, resp. rate 18, height _0  (1.727 m), weight 80.3 kg (177 lb 0.5 oz), SpO2 99 %. General: pleasant, WD, WN white male who is laying in bed in NAD, but nauseated secondary to recent anesthesia from his ERCP. HEENT: head is normocephalic, atraumatic.  Sclera are icteric.  PERRL.  Ears and nose without any masses or lesions.  Mouth is pink and moist Heart: regular, rate, and rhythm.  Normal s1,s2. No obvious murmurs, gallops, or rubs noted.  Palpable radial and pedal pulses bilaterally Lungs: CTAB, no wheezes, rhonchi, or rales noted.  Respiratory effort nonlabored Abd: soft, mildly tender in the right upper quadrant, ND, +BS, no masses, hernias, or organomegaly MS: all 4 extremities are symmetrical with no cyanosis, clubbing, or edema. Skin: warm and dry with no masses, lesions, or rashes.  He has diffuse jaundice. Psych: A&Ox3 with an appropriate affect, but still mildly sedated from his anesthesia.   Results for orders placed or performed during the hospital encounter of 02/06/18 (from the past 48 hour(s))  Glucose, capillary     Status: Abnormal    Collection Time: 02/06/18  9:55 PM  Result Value Ref Range   Glucose-Capillary 124 (H) 65 - 99 mg/dL   Comment 1 Notify RN    Comment 2 Document in Chart   Surgical pcr screen     Status: None   Collection Time: 02/06/18 10:05 PM  Result Value Ref Range   MRSA, PCR NEGATIVE NEGATIVE   Staphylococcus aureus NEGATIVE NEGATIVE    Comment: (NOTE) The Xpert SA Assay (FDA approved for NASAL specimens in patients 85 years of age and older), is one component of a  comprehensive surveillance program. It is not intended to diagnose infection nor to guide or monitor treatment. Performed at [facility], [address] [place], [place] [address]   Comprehensive metabolic panel     Status: Abnormal   Collection Time: 02/07/18  6:39 AM  Result Value Ref Range   Sodium 136 135 - 145 mmol/L   Potassium 4.6 3.5 - 5.1 mmol/L   Chloride 105 101 - 111 mmol/L   CO2 23 22 - 32 mmol/L   Glucose, Bld 96 65 - 99 mg/dL   BUN 26 (H) 6 - 20 mg/dL   Creatinine, Ser 1.27 (H) 0.61 - 1.24 mg/dL   Calcium 9.3 8.9 - 10.3 mg/dL   Total Protein 7.3 6.5 - 8.1 g/dL   Albumin 3.3 (L) 3.5 - 5.0 g/dL   AST 129 (H) 15 - 41 U/L   ALT 251 (H) 17 - 63 U/L   Alkaline Phosphatase 201 (H) 38 - 126 U/L   Total Bilirubin 14.2 (H) 0.3 - 1.2 mg/dL   GFR calc non Af Amer >60 >60 mL/min   GFR calc Af Amer >60 >60 mL/min    Comment: (NOTE) The eGFR has been calculated using the CKD EPI equation. This calculation has not been validated in all clinical situations. eGFR's persistently <60 mL/min signify possible Chronic Kidney Disease.    Anion gap 8 5 - 15    Comment: Performed at [facility], [address], [place], [place] [address]  Hepatitis panel, acute     Status: None   Collection Time: 02/07/18  6:39 AM  Result Value Ref Range   Hepatitis B Surface Ag Negative Negative   HCV Ab <0.1 0.0 - 0.9 s/co ratio    Comment: (NOTE)                                  Negative:     < 0.8                              Indeterminate: 0.8 - 0.9                                  Positive:     > 0.9 The CDC recommends that a positive HCV antibody result be followed up with a HCV Nucleic Acid Amplification test ([id number]). Performed At: [facility] [place], [place] [id number] Rush Farmer MD VC:[id number]    Hep A IgM Negative Negative   Hep B C IgM Negative Negative    Comment: Performed at [facility], [address] [place], [place] [address]  Lipid panel     Status: Abnormal   Collection Time: 02/07/18  6:39 AM  Result Value Ref Range   Cholesterol 371 (H) 0 - 200 mg/dL   Triglycerides 338 (H) <150 mg/dL   HDL <10 (L) >40 mg/dL   Total CHOL/HDL Ratio NOT CALCULATED RATIO   VLDL 68 (H) 0 - 40 mg/dL   LDL Cholesterol NOT CALCULATED 0 - 99 mg/dL    Comment: Performed at [facility], [address] [place], [place] [address]  Glucose, capillary     Status: None   Collection Time: 02/07/18  7:52 AM  Result Value Ref Range   Glucose-Capillary 86 65 - 99 mg/dL  Glucose, capillary  Status: Abnormal   Collection Time: 02/07/18 11:32 AM  Result Value Ref Range   Glucose-Capillary 100 (H) 65 - 99 mg/dL  Glucose, capillary     Status: Abnormal   Collection Time: 02/07/18  3:24 PM  Result Value Ref Range   Glucose-Capillary 118 (H) 65 - 99 mg/dL  Glucose, capillary     Status: None   Collection Time: 02/07/18  5:54 PM  Result Value Ref Range   Glucose-Capillary 99 65 - 99 mg/dL  Glucose, capillary     Status: Abnormal   Collection Time: 02/07/18 10:29 PM  Result Value Ref Range   Glucose-Capillary 159 (H) 65 - 99 mg/dL  Comprehensive metabolic panel     Status: Abnormal   Collection Time: 02/08/18  5:44 AM  Result Value Ref Range   Sodium 135 135 - 145 mmol/L   Potassium 4.4 3.5 - 5.1 mmol/L   Chloride 106 101 - 111 mmol/L   CO2 20 (L) 22 - 32 mmol/L   Glucose, Bld 128 (H) 65 - 99 mg/dL   BUN 17 6 - 20 mg/dL   Creatinine, Ser 1.34 (H) 0.61 - 1.24 mg/dL   Calcium  9.1 8.9 - 10.3 mg/dL   Total Protein 7.0 6.5 - 8.1 g/dL   Albumin 3.1 (L) 3.5 - 5.0 g/dL   AST 123 (H) 15 - 41 U/L   ALT 223 (H) 17 - 63 U/L   Alkaline Phosphatase 183 (H) 38 - 126 U/L   Total Bilirubin 15.4 (H) 0.3 - 1.2 mg/dL   GFR calc non Af Amer >60 >60 mL/min   GFR calc Af Amer >60 >60 mL/min    Comment: (NOTE) The eGFR has been calculated using the CKD EPI equation. This calculation has not been validated in all clinical situations. eGFR's persistently <60 mL/min signify possible Chronic Kidney Disease.    Anion gap 9 5 - 15    Comment: Performed at [facility] [address], [place], [place] [address]  Glucose, capillary     Status: Abnormal   Collection Time: 02/08/18  7:43 AM  Result Value Ref Range   Glucose-Capillary 133 (H) 65 - 99 mg/dL  Glucose, capillary     Status: Abnormal   Collection Time: 02/08/18 12:10 PM  Result Value Ref Range   Glucose-Capillary 111 (H) 65 - 99 mg/dL   Dg Ercp Biliary & Pancreatic Ducts  Result Date: 02/08/2018 CLINICAL DATA:  ERCP.  Concern for choledocholithiasis. EXAM: ERCP TECHNIQUE: Multiple spot images obtained with the fluoroscopic device and submitted for interpretation post-procedure. COMPARISON:  CT abdomen pelvis-02/06/2018; right upper quadrant abdominal ultrasound-02/07/2018 FLUOROSCOPY TIME:  5 minutes, 22 seconds FINDINGS: Sixteen spot intraoperative fluoroscopic images of the right upper abdominal quadrant during ERCP are provided for review Initial image demonstrates an ERCP probe overlying the right upper abdominal quadrant. There is selective cannulation and faint opacification of the common bile duct. There is faint opacification of the cystic duct which contains several nonocclusive filling defects suggestive of choledocholithiasis. There are no discrete persistent filling defects with opacified portion of the common bile duct. There is minimal opacification intrahepatic biliary system which appears mildly dilated.  IMPRESSION: ERCP with presumed stones within the cystic duct but not definitively within the common bile duct as detailed above. These images were submitted for radiologic interpretation only. Please see the procedural report for the amount of contrast and the fluoroscopy time utilized. Electronically Signed   By: Sandi Mariscal M.D.   On: 02/08/2018 14:59  US Abdomen Limited Ruq  Result Date: 02/07/2018 CLINICAL DATA:  Hepatic cirrhosis by history. Now with elevated liver function studies, jaundice. Patient found have fatty liver changes on CT scan yesterday as well as gallstones and gallbladder wall thickening. EXAM: ULTRASOUND ABDOMEN LIMITED RIGHT UPPER QUADRANT COMPARISON:  Noncontrast abdominal and pelvic CT scan of February 06, 2018. FINDINGS: Gallbladder: The gallbladder is mildly distended. There are multiple echogenic mobile shadowing stones measuring up to 1.5 cm in diameter. There is mild gallbladder wall thickening to 4 mm. There is no positive sonographic Murphy's sign. Common bile duct: Diameter: 7 mm; no intraluminal sludge or stone is observed. Liver: The hepatic echotexture is increased. There is intrahepatic ductal dilation. The surface contour of the liver is fairly smooth. Portal vein is patent on color Doppler imaging with normal direction of blood flow towards the liver. IMPRESSION: Multiple gallstones with mild gallbladder wall thickening and gallbladder distention without positive sonographic Murphy's sign. This likely reflects a subacute or chronic inflammation. There is also intrahepatic and common bile ductal dilation. The possibility of a distal common bile duct stone or distal common bile duct obstructive process is raised. MRCP now would be a useful next imaging step. Fatty infiltrative changes of the liver. No definite cirrhotic changes by ultrasound. Electronically Signed   By: David  Martinique M.D.   On: 02/07/2018 14:24      Assessment/Plan Acute calculus cholecystitis,  hyperbilirubinemia, possible Mirizzi's syndrome The patient does have a history of some fatty liver disease but his labs from his annual physical exam 11 days ago were normal essentially.  He clearly has an elevation of his bilirubin as well as his other liver function tests.  It does not appear his history of fatty liver disease is significant at this time.  He did undergo ERCP today which did not reveal evidence of bile duct obstruction.  It was felt that he was having compression of his common bile duct secondary to Mirizzi's syndrome.  His bilirubin is around 15 currently which is quite high however no other obstructing lesions or etiology for his bilirubin has been identified.  At this time, he will benefit from a laparoscopic cholecystectomy.  He will be n.p.o. after midnight tonight in preparation for this intervention tomorrow.  He is still somewhat groggy from his anesthetic today; therefore, we will further explain the procedure as well as risks, complications, and expected outcome tomorrow when he is fully awake in order to obtain consent for this procedure.  All other questions have been answered at this time from his family.  HTN DM Tobacco abuse  FEN - NPO p MN VTE - SCDs/Lovenox ID - [place], [facility] 02/08/2018, 4:40 PM Pager: [phone]

## 2018-02-08 NOTE — Anesthesia Preprocedure Evaluation (Signed)
Anesthesia Evaluation  Patient identified by MRN, date of birth, ID band Patient awake    Reviewed: Allergy & Precautions, H&P , NPO status , Patient's Chart, lab work & pertinent test results, reviewed documented beta blocker date and time   Airway Mallampati: II  TM Distance: >3 FB Neck ROM: full    Dental no notable dental hx. (+) Missing, Teeth Intact,    Pulmonary Current Smoker,    Pulmonary exam normal breath sounds clear to auscultation       Cardiovascular Exercise Tolerance: Good hypertension, Pt. on medications  Rhythm:regular Rate:Normal     Neuro/Psych negative neurological ROS  negative psych ROS   GI/Hepatic Neg liver ROS, GERD  Medicated,  Endo/Other  negative endocrine ROSdiabetes, Type 2, Oral Hypoglycemic Agents  Renal/GU negative Renal ROS  negative genitourinary   Musculoskeletal   Abdominal   Peds  Hematology negative hematology ROS (+)   Anesthesia Other Findings   Reproductive/Obstetrics negative OB ROS                             Anesthesia Physical Anesthesia Plan  ASA: III  Anesthesia Plan: General   Post-op Pain Management:    Induction: Intravenous  PONV Risk Score and Plan:   Airway Management Planned: Oral ETT  Additional Equipment:   Intra-op Plan:   Post-operative Plan: Extubation in OR  Informed Consent: I have reviewed the patients History and Physical, chart, labs and discussed the procedure including the risks, benefits and alternatives for the proposed anesthesia with the patient or authorized representative who has indicated his/her understanding and acceptance.   Dental Advisory Given  Plan Discussed with: CRNA, Anesthesiologist and Surgeon  Anesthesia Plan Comments: (  )        Anesthesia Quick Evaluation

## 2018-02-08 NOTE — Progress Notes (Signed)
PROGRESS NOTE  Gregory Thompson  ZOX:[id number] DOB: 08-Jun-1966 DOA: 02/06/2018 PCP: Babs Sciara, MD   Brief Narrative: Gregory Thompson is a 52 y.o. male with a history of HTN, T2DM, HLD, IBS, gout, GERD, and gallstones who presented to [facility] with jaundice and associated N/V/D which had actually improved, though he continued with anorexia, malaise, fatigue. In the ED bilirubin was found to be 14.9, AST/ALT 143/294, AP 193, and creatinine up from baseline at 1.8 consistent with dehydration. CT abd/pelvis showed gallstones and sludge with gallbladder wall thickening and stone in gallbladder neck. Subsequent U/S showed no Murphy's sign and confirmed the above findings with question of CBD dilatation. MRCP would require general anesthesia so the patient was transferred to [facility] for further evaluation. 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 General surgery consultation has been requested.   Assessment & Plan: Principal Problem:   Elevated LFTs Active Problems:   GERD (gastroesophageal reflux disease)   Tobacco abuse   Hypertriglyceridemia   Irritable bowel syndrome   Type 2 diabetes mellitus (HCC)   HTN (hypertension)   Acute renal injury (HCC)   Acute cholecystitis due to biliary calculus  Cholelithiasis with biliary obstruction and acute/chronic cholecystitis:  - Trend LFTs - Follow up general surgery recommendations; NPO p MN for lap chole in AM - Diet per GI/surgery - On zosyn though no evidence of cholangitis at this time.  AKI: Improving w/IVF's.  - Continue IV fluids and holding ARB  T2DM: Excellent control, last HbA1c 5.8%. - Holding home hypoglycemics.  - On SSI, will decrease to sensitive scale, would consider discontinuing this pending further insulin requirements.   HTN:  - Monitor while holding ARB - Hydralazine prn  GERD: Chronic, stable - Continue PPI  IBS:  - Continuing  hyoscyamine   Hyperlipidemia:  - Hold statin with elevated LFTs  DVT prophylaxis: Lovenox Code Status: Full Family Communication: Mother, wife, DIL at bedside Disposition Plan: Home once improved.   Consultants:   GI  General surgery  Procedures:   ERCP 02/08/2018: Findings:      The scout film was normal. The esophagus was successfully intubated       under direct vision. The scope was advanced to a normal major papilla in       the descending duodenum without detailed examination of the pharynx,       larynx and associated structures, and upper GI tract. The upper GI tract       was grossly normal. No bile was seen coming from the major papilla. An       0.035 inch x 260 cm straight Hydra Jagwire was passed into the biliary       tree. The traction (standard) sphincterotome was passed over the       guidewire and the bile duct was then deeply cannulated. Contrast was       injected. I personally interpreted the bile duct images. There was brisk       flow of contrast through the ducts. Image quality was excellent.       Contrast extended to the hepatic ducts. The gallbladder contained       multiple small and large stones. The common hepatic duct and left and       right hepatic ducts and all intrahepatic branches were diffusely       dilated. The common bile duct was normal: 5-6 mm and no stones  or       strictures.      Initially, the intrahepatic ducts and the common hepatic duct were slow       to fill. The entirety of the gallbladder was not seen, but there were       definitely multiple large and small stones in what appeared to be the       neck of the gallbladder or a dilated cystic duct. With contrast       injection, one of those large stones moved distally, and then the CHD       and intrahepatic ducts began to opacify better. At that point, dark bile       also began to flow freely from the papilla, and continued to do so until       the end of the procedure.  Multiple images were obtained at regular and       high magnification, showing no stones or strictures in the extrahepatic       bile duct. The procedure was then terminated.The total fluoroscopy       exposure time was 5 minutes and 22 seconds. Impression:               - The left and right hepatic ducts and all                            intrahepatic branches and common hepatic duct were                            dilated.                           - Cholelithiasis was found.                           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 Recommendation:           - Surgical consult for cholecystectomy.                           Daily LFTs while hospitalized.    Antimicrobials:  Zosyn  Subjective: Groggy after ERCP, abd pain stable. Denies ever having diarrhea. + vomiting which he actually attributes to anesthesia. No CP, dyspnea.  Objective: Vitals:   02/08/18 1427 02/08/18 1437 02/08/18 1447 02/08/18 1511  BP: 128/71 125/81 131/86 (!) 135/95  Pulse: 92 79 73 68  Resp: 14 20 (!) 23 18  Temp:    98.4 F (36.9 C)  TempSrc:    Oral  SpO2: 99% 97% 99% 99%  Weight:      Height:        Intake/Output Summary (Last 24 hours) at 02/08/2018 1624 Last data filed at 02/08/2018 1418 Gross per 24 hour  Intake 1427.5 ml  Output 300 ml  Net 1127.5 ml   Filed Weights   02/06/18 1837 02/07/18 1620 02/08/18 1200  Weight: 81.4 kg (179 lb 7.3 oz) 80.3 kg (177 lb 0.5 oz) 80.3 kg (177 lb 0.5 oz)    Gen: 52 y.o. male in no distress Pulm: Non-labored breathing room air. Clear to auscultation bilaterally.  CV: Regular rate and rhythm. No murmur, rub, or gallop. No JVD, no pedal edema. GI: Abdomen soft, tender in upper quadrants, no rebound or guarding. +normoactive bowel sounds. No organomegaly or masses felt. Ext: Warm, no deformities Skin: Diffuse jaundice, otherwise no rashes, lesions or ulcers Neuro: Drowsy but rousable,  conversant, and oriented. No focal neurological deficits. No asterixis Psych: Judgement and insight appear intact. Mood & affect appropriate.   Data Reviewed: I have personally reviewed following labs and imaging studies  CBC: Recent Labs  Lab 02/06/18 1347  WBC 8.2  NEUTROABS 5.8  HGB 13.9  HCT 40.3  MCV 89.8  PLT 262   Basic Metabolic Panel: Recent Labs  Lab 02/06/18 1347 02/07/18 0639 02/08/18 0544  NA 133* 136 135  K 4.1 4.6 4.4  CL 101 105 106  CO2 21* 23 20*  GLUCOSE 176* 96 128*  BUN 35* 26* 17  CREATININE 1.86* 1.27* 1.34*  CALCIUM 9.7 9.3 9.1   GFR: Estimated Creatinine Clearance: 63.8 mL/min (A) (by C-G formula based on SCr of 1.34 mg/dL (H)). Liver Function Tests: Recent Labs  Lab 02/06/18 1347 02/07/18 0639 02/08/18 0544  AST 143* 129* 123*  ALT 294* 251* 223*  ALKPHOS 193* 201* 183*  BILITOT 15.0*  14.9* 14.2* 15.4*  PROT 8.1 7.3 7.0  ALBUMIN 3.7 3.3* 3.1*   Recent Labs  Lab 02/06/18 1347  LIPASE 44   No results for input(s): AMMONIA in the last 168 hours. Coagulation Profile: Recent Labs  Lab 02/06/18 1347  INR 1.05   Cardiac Enzymes: Recent Labs  Lab 02/06/18 1347  TROPONINI <0.03   BNP (last 3 results) No results for input(s): PROBNP in the last 8760 hours. HbA1C: No results for input(s): HGBA1C in the last 72 hours. CBG: Recent Labs  Lab 02/07/18 1524 02/07/18 1754 02/07/18 2229 02/08/18 0743 02/08/18 1210  GLUCAP 118* 99 159* 133* 111*   Lipid Profile: Recent Labs    02/07/18 0639  CHOL 371*  HDL <10*  LDLCALC NOT CALCULATED  TRIG 338*  CHOLHDL NOT CALCULATED   Thyroid Function Tests: No results for input(s): TSH, T4TOTAL, FREET4, T3FREE, THYROIDAB in the last 72 hours. Anemia Panel: No results for input(s): VITAMINB12, FOLATE, FERRITIN, TIBC, IRON, RETICCTPCT in the last 72 hours. Urine analysis:    Component Value Date/Time   COLORURINE AMBER (A) 02/06/2018 1355   APPEARANCEUR HAZY (A) 02/06/2018 1355    LABSPEC 1.019 02/06/2018 1355   PHURINE 5.0 02/06/2018 1355   GLUCOSEU NEGATIVE 02/06/2018 1355   HGBUR NEGATIVE 02/06/2018 1355   BILIRUBINUR MODERATE (A) 02/06/2018 1355   KETONESUR NEGATIVE 02/06/2018 1355   PROTEINUR 30 (A) 02/06/2018 1355   NITRITE NEGATIVE 02/06/2018 1355   LEUKOCYTESUR NEGATIVE 02/06/2018 1355   Recent Results (from the past 240 hour(s))  Urine culture     Status: None   Collection Time: 02/06/18  1:55 PM  Result Value Ref Range Status   Specimen Description   Final    URINE, CLEAN CATCH Performed at [facility], [address], [place], [place] [address]    Special Requests   Final    NONE Performed at [facility], [address], [place], [place] [address]    Culture   Final    NO GROWTH Performed at [facility], [address], [place], [place] [address]    Report Status 02/08/2018 FINAL  Final  Surgical pcr screen     Status: None   Collection Time: 02/06/18 10:05 PM  Result  Value Ref Range Status   MRSA, PCR NEGATIVE NEGATIVE Final   Staphylococcus aureus NEGATIVE NEGATIVE Final    Comment: (NOTE) The Xpert SA Assay (FDA approved for NASAL specimens in patients 39 years of age and older), is one component of a comprehensive surveillance program. It is not intended to diagnose infection nor to guide or monitor treatment. Performed at [facility], [address] [place], [place] [address]       Radiology Studies: Dg Ercp Biliary & Pancreatic Ducts  Result Date: 02/08/2018 CLINICAL DATA:  ERCP.  Concern for choledocholithiasis. EXAM: ERCP TECHNIQUE: Multiple spot images obtained with the fluoroscopic device and submitted for interpretation post-procedure. COMPARISON:  CT abdomen pelvis-02/06/2018; right upper quadrant abdominal ultrasound-02/07/2018 FLUOROSCOPY TIME:  5 minutes, 22 seconds FINDINGS: Sixteen spot intraoperative fluoroscopic images of the right upper abdominal quadrant during ERCP are provided for review Initial image  demonstrates an ERCP probe overlying the right upper abdominal quadrant. There is selective cannulation and faint opacification of the common bile duct. There is faint opacification of the cystic duct which contains several nonocclusive filling defects suggestive of choledocholithiasis. There are no discrete persistent filling defects with opacified portion of the common bile duct. There is minimal opacification intrahepatic biliary system which appears mildly dilated. IMPRESSION: ERCP with presumed stones within the cystic duct but not definitively within the common bile duct as detailed above. These images were submitted for radiologic interpretation only. Please see the procedural report for the amount of contrast and the fluoroscopy time utilized. Electronically Signed   By: Simonne Come M.D.   On: 02/08/2018 14:59   US Abdomen Limited Ruq  Result Date: 02/07/2018 CLINICAL DATA:  Hepatic cirrhosis by history. Now with elevated liver function studies, jaundice. Patient found have fatty liver changes on CT scan yesterday as well as gallstones and gallbladder wall thickening. EXAM: ULTRASOUND ABDOMEN LIMITED RIGHT UPPER QUADRANT COMPARISON:  Noncontrast abdominal and pelvic CT scan of February 06, 2018. FINDINGS: Gallbladder: The gallbladder is mildly distended. There are multiple echogenic mobile shadowing stones measuring up to 1.5 cm in diameter. There is mild gallbladder wall thickening to 4 mm. There is no positive sonographic Murphy's sign. Common bile duct: Diameter: 7 mm; no intraluminal sludge or stone is observed. Liver: The hepatic echotexture is increased. There is intrahepatic ductal dilation. The surface contour of the liver is fairly smooth. Portal vein is patent on color Doppler imaging with normal direction of blood flow towards the liver. IMPRESSION: Multiple gallstones with mild gallbladder wall thickening and gallbladder distention without positive sonographic Murphy's sign. This likely reflects a  subacute or chronic inflammation. There is also intrahepatic and common bile ductal dilation. The possibility of a distal common bile duct stone or distal common bile duct obstructive process is raised. MRCP now would be a useful next imaging step. Fatty infiltrative changes of the liver. No definite cirrhotic changes by ultrasound. Electronically Signed   By: David  [place] M.D.   On: 02/07/2018 14:24    Scheduled Meds: . allopurinol  300 mg Oral Daily  . enoxaparin (LOVENOX) injection  40 mg Subcutaneous Q24H  . gemfibrozil  600 mg Oral Daily  . indomethacin  100 mg Rectal Once  . insulin aspart  0-15 Units Subcutaneous TID WC  . insulin aspart  0-5 Units Subcutaneous QHS  . pantoprazole  40 mg Oral BID   Continuous Infusions: . dextrose 5 % and 0.45 % NaCl with KCl 20 mEq/L 75 mL/hr at 02/07/18 2353  . piperacillin-tazobactam (ZOSYN)  IV 3.375 g (02/08/18 0957)     LOS: 2 days   Time spent: 25 minutes.  Tyrone Nineyan B Jahmil Macleod, MD [facility] [email or web address] Password [email or web address] 02/08/2018, 4:24 PM

## 2018-02-09 ENCOUNTER — Inpatient Hospital Stay (HOSPITAL_COMMUNITY): Payer: 59 | Admitting: Certified Registered Nurse Anesthetist

## 2018-02-09 ENCOUNTER — Inpatient Hospital Stay (HOSPITAL_COMMUNITY): Payer: 59

## 2018-02-09 ENCOUNTER — Encounter (HOSPITAL_COMMUNITY)
Admission: EM | Disposition: A | Discharge status: Home / Self Care | Payer: Self-pay | Source: Home / Self Care | Attending: Internal Medicine

## 2018-02-09 ENCOUNTER — Encounter (HOSPITAL_COMMUNITY): Payer: Self-pay | Admitting: Surgery

## 2018-02-09 DIAGNOSIS — N179 Acute kidney failure, unspecified: Secondary | ICD-10-CM

## 2018-02-09 HISTORY — PX: CHOLECYSTECTOMY: SHX55

## 2018-02-09 LAB — CBC WITH DIFFERENTIAL/PLATELET
ABS IMMATURE GRANULOCYTES: 0.1 10*3/uL (ref 0.0–0.1)
Basophils Absolute: 0 10*3/uL (ref 0.0–0.1)
Basophils Relative: 0 %
Eosinophils Absolute: 0 10*3/uL (ref 0.0–0.7)
Eosinophils Relative: 0 %
HCT: 35.4 % — ABNORMAL LOW (ref 39.0–52.0)
HEMOGLOBIN: 12 g/dL — AB (ref 13.0–17.0)
IMMATURE GRANULOCYTES: 1 %
LYMPHS PCT: 16 %
Lymphs Abs: 1.5 10*3/uL (ref 0.7–4.0)
MCH: 29.9 pg (ref 26.0–34.0)
MCHC: 33.9 g/dL (ref 30.0–36.0)
MCV: 88.3 fL (ref 78.0–100.0)
MONO ABS: 0.7 10*3/uL (ref 0.1–1.0)
Monocytes Relative: 7 %
NEUTROS ABS: 7 10*3/uL (ref 1.7–7.7)
Neutrophils Relative %: 76 %
Platelets: 290 10*3/uL (ref 150–400)
RBC: 4.01 MIL/uL — ABNORMAL LOW (ref 4.22–5.81)
RDW: 14.6 % (ref 11.5–15.5)
WBC: 9.2 10*3/uL (ref 4.0–10.5)

## 2018-02-09 LAB — COMPREHENSIVE METABOLIC PANEL
ALBUMIN: 3.2 g/dL — AB (ref 3.5–5.0)
ALT: 214 U/L — ABNORMAL HIGH (ref 17–63)
ANION GAP: 10 (ref 5–15)
AST: 116 U/L — ABNORMAL HIGH (ref 15–41)
Alkaline Phosphatase: 174 U/L — ABNORMAL HIGH (ref 38–126)
BUN: 22 mg/dL — ABNORMAL HIGH (ref 6–20)
CO2: 21 mmol/L — AB (ref 22–32)
Calcium: 9.2 mg/dL (ref 8.9–10.3)
Chloride: 102 mmol/L (ref 101–111)
Creatinine, Ser: 1.37 mg/dL — ABNORMAL HIGH (ref 0.61–1.24)
GFR calc Af Amer: >60 mL/min (ref >60)
GFR calc non Af Amer: 59 mL/min — ABNORMAL LOW (ref >60)
GLUCOSE: 141 mg/dL — AB (ref 65–99)
POTASSIUM: 4.9 mmol/L (ref 3.5–5.1)
SODIUM: 133 mmol/L — AB (ref 135–145)
TOTAL PROTEIN: 7.4 g/dL (ref 6.5–8.1)
Total Bilirubin: 16.6 mg/dL — ABNORMAL HIGH (ref 0.3–1.2)

## 2018-02-09 LAB — GLUCOSE, CAPILLARY
GLUCOSE-CAPILLARY: 154 mg/dL — AB (ref 65–99)
GLUCOSE-CAPILLARY: 128 mg/dL — AB (ref 65–99)
GLUCOSE-CAPILLARY: 133 mg/dL — AB (ref 65–99)
GLUCOSE-CAPILLARY: 146 mg/dL — AB (ref 65–99)

## 2018-02-09 SURGERY — LAPAROSCOPIC CHOLECYSTECTOMY WITH INTRAOPERATIVE CHOLANGIOGRAM
Anesthesia: General | Site: Abdomen

## 2018-02-09 MED ORDER — MORPHINE SULFATE (PF) 2 MG/ML IV SOLN: 1.0000 mg | INTRAVENOUS | Status: DC | PRN

## 2018-02-09 MED ORDER — SODIUM CHLORIDE 0.9 % IV SOLN
INTRAVENOUS | Status: DC | PRN
  Administered 2018-02-09: 50 mL

## 2018-02-09 MED ORDER — OXYCODONE HCL 5 MG/5ML PO SOLN: 5.0000 mg | Freq: Once | ORAL | Status: DC | PRN

## 2018-02-09 MED ORDER — BUPIVACAINE-EPINEPHRINE (PF) 0.25% -1:200000 IJ SOLN
INTRAMUSCULAR | Status: AC
  Filled 2018-02-09: qty 30

## 2018-02-09 MED ORDER — 0.9 % SODIUM CHLORIDE (POUR BTL) OPTIME
TOPICAL | Status: DC | PRN
  Administered 2018-02-09: 1000 mL

## 2018-02-09 MED ORDER — FENTANYL CITRATE (PF) 250 MCG/5ML IJ SOLN
INTRAMUSCULAR | Status: AC
  Filled 2018-02-09: qty 5

## 2018-02-09 MED ORDER — MIDAZOLAM HCL 5 MG/5ML IJ SOLN
INTRAMUSCULAR | Status: DC | PRN
  Administered 2018-02-09: 2 mg via INTRAVENOUS

## 2018-02-09 MED ORDER — SUGAMMADEX SODIUM 200 MG/2ML IV SOLN
INTRAVENOUS | Status: DC | PRN
  Administered 2018-02-09: 150 mg via INTRAVENOUS

## 2018-02-09 MED ORDER — ENOXAPARIN SODIUM 40 MG/0.4ML ~~LOC~~ SOLN: 40.0000 mg | SUBCUTANEOUS | Status: DC

## 2018-02-09 MED ORDER — SODIUM CHLORIDE 0.9 % IR SOLN
Status: DC | PRN
  Administered 2018-02-09: 1000 mL

## 2018-02-09 MED ORDER — BUPIVACAINE HCL 0.25 % IJ SOLN
INTRAMUSCULAR | Status: DC | PRN
  Administered 2018-02-09: 60 mL

## 2018-02-09 MED ORDER — ESMOLOL HCL 100 MG/10ML IV SOLN
INTRAVENOUS | Status: DC | PRN
  Administered 2018-02-09: 30 mg via INTRAVENOUS

## 2018-02-09 MED ORDER — PROPOFOL 10 MG/ML IV BOLUS
INTRAVENOUS | Status: DC | PRN
  Administered 2018-02-09: 160 mg via INTRAVENOUS

## 2018-02-09 MED ORDER — HYDROMORPHONE HCL 2 MG/ML IJ SOLN
0.2500 mg | INTRAMUSCULAR | Status: DC | PRN
  Administered 2018-02-09 (×2): 0.5 mg via INTRAVENOUS

## 2018-02-09 MED ORDER — HYOSCYAMINE SULFATE ER 0.375 MG PO TB12
0.3750 mg | ORAL_TABLET | Freq: Every day | ORAL | Status: DC
  Administered 2018-02-09 – 2018-02-11 (×3): 0.375 mg via ORAL
  Filled 2018-02-09 (×4): qty 1

## 2018-02-09 MED ORDER — SCOPOLAMINE 1 MG/3DAYS TD PT72
MEDICATED_PATCH | TRANSDERMAL | Status: DC | PRN
  Administered 2018-02-09: 1 via TRANSDERMAL

## 2018-02-09 MED ORDER — OXYCODONE HCL 5 MG PO TABS
5.0000 mg | ORAL_TABLET | ORAL | Status: DC | PRN
  Administered 2018-02-09 – 2018-02-10 (×2): 5 mg via ORAL
  Administered 2018-02-10: 10 mg via ORAL
  Filled 2018-02-09: qty 2
  Filled 2018-02-09 (×2): qty 1
  Filled 2018-02-09: qty 2

## 2018-02-09 MED ORDER — ROCURONIUM BROMIDE 100 MG/10ML IV SOLN
INTRAVENOUS | Status: DC | PRN
  Administered 2018-02-09: 50 mg via INTRAVENOUS
  Administered 2018-02-09 (×3): 10 mg via INTRAVENOUS

## 2018-02-09 MED ORDER — ONDANSETRON HCL 4 MG/2ML IJ SOLN
INTRAMUSCULAR | Status: DC | PRN
  Administered 2018-02-09: 4 mg via INTRAVENOUS

## 2018-02-09 MED ORDER — IOPAMIDOL (ISOVUE-300) INJECTION 61%
INTRAVENOUS | Status: AC
  Filled 2018-02-09: qty 50

## 2018-02-09 MED ORDER — STERILE WATER FOR IRRIGATION IR SOLN
Status: DC | PRN
  Administered 2018-02-09: 1000 mL

## 2018-02-09 MED ORDER — HYDROMORPHONE HCL 2 MG/ML IJ SOLN
INTRAMUSCULAR | Status: AC
  Filled 2018-02-09: qty 1

## 2018-02-09 MED ORDER — FENTANYL CITRATE (PF) 100 MCG/2ML IJ SOLN
INTRAMUSCULAR | Status: DC | PRN
  Administered 2018-02-09: 100 ug via INTRAVENOUS
  Administered 2018-02-09 (×5): 50 ug via INTRAVENOUS

## 2018-02-09 MED ORDER — OXYCODONE HCL 5 MG PO TABS: 5.0000 mg | ORAL_TABLET | Freq: Once | ORAL | Status: DC | PRN

## 2018-02-09 MED ORDER — PROMETHAZINE HCL 25 MG/ML IJ SOLN: 6.2500 mg | INTRAMUSCULAR | Status: DC | PRN

## 2018-02-09 MED ORDER — LACTATED RINGERS IV SOLN
INTRAVENOUS | Status: DC
  Administered 2018-02-09 – 2018-02-11 (×3): via INTRAVENOUS

## 2018-02-09 MED ORDER — LIDOCAINE HCL (CARDIAC) PF 100 MG/5ML IV SOSY
PREFILLED_SYRINGE | INTRAVENOUS | Status: DC | PRN
  Administered 2018-02-09: 80 mg via INTRAVENOUS

## 2018-02-09 MED ORDER — MIDAZOLAM HCL 2 MG/2ML IJ SOLN
INTRAMUSCULAR | Status: AC
  Filled 2018-02-09: qty 2

## 2018-02-09 MED ORDER — SCOPOLAMINE 1 MG/3DAYS TD PT72
MEDICATED_PATCH | TRANSDERMAL | Status: AC
  Filled 2018-02-09: qty 1

## 2018-02-09 MED ORDER — MEPERIDINE HCL 50 MG/ML IJ SOLN: 6.2500 mg | INTRAMUSCULAR | Status: DC | PRN

## 2018-02-09 MED ORDER — LABETALOL HCL 5 MG/ML IV SOLN
INTRAVENOUS | Status: DC | PRN
  Administered 2018-02-09 (×2): 5 mg via INTRAVENOUS

## 2018-02-09 SURGICAL SUPPLY — 51 items
APPLIER CLIP ROT 10 11.4 M/L (STAPLE)
BLADE CLIPPER SURG (BLADE) ×3 IMPLANT
CANISTER SUCT 3000ML PPV (MISCELLANEOUS) ×3 IMPLANT
CATH CHOLANG 76X19 KUMAR (CATHETERS) ×3 IMPLANT
CHLORAPREP W/TINT 26ML (MISCELLANEOUS) ×3 IMPLANT
CLIP APPLIE ROT 10 11.4 M/L (STAPLE) IMPLANT
CLIP VESOLOCK MED LG 6/CT (CLIP) ×6 IMPLANT
COVER MAYO STAND STRL (DRAPES) ×3 IMPLANT
COVER SURGICAL LIGHT HANDLE (MISCELLANEOUS) ×3 IMPLANT
DERMABOND ADVANCED (GAUZE/BANDAGES/DRESSINGS) ×2
DERMABOND ADVANCED .7 DNX12 (GAUZE/BANDAGES/DRESSINGS) ×1 IMPLANT
DRAIN CHANNEL 19F RND (DRAIN) ×3 IMPLANT
DRAPE C-ARM 42X72 X-RAY (DRAPES) ×3 IMPLANT
DRAPE LAPAROSCOPIC ABDOMINAL (DRAPES) ×3 IMPLANT
ELECT REM PT RETURN 9FT ADLT (ELECTROSURGICAL) ×3
ELECTRODE REM PT RTRN 9FT ADLT (ELECTROSURGICAL) ×1 IMPLANT
ENDOLOOP SUT PDS II  0 18 (SUTURE) ×4
ENDOLOOP SUT PDS II 0 18 (SUTURE) ×2 IMPLANT
EVACUATOR SILICONE 100CC (DRAIN) ×3 IMPLANT
GLOVE BIO SURGEON STRL SZ8.5 (GLOVE) ×3 IMPLANT
GLOVE BIOGEL PI IND STRL 7.0 (GLOVE) ×1 IMPLANT
GLOVE BIOGEL PI INDICATOR 7.0 (GLOVE) ×2
GLOVE SURG SS PI 7.0 STRL IVOR (GLOVE) ×3 IMPLANT
GOWN STRL REUS W/ TWL LRG LVL3 (GOWN DISPOSABLE) ×3 IMPLANT
GOWN STRL REUS W/ TWL XL LVL3 (GOWN DISPOSABLE) ×2 IMPLANT
GOWN STRL REUS W/TWL LRG LVL3 (GOWN DISPOSABLE) ×6
GOWN STRL REUS W/TWL XL LVL3 (GOWN DISPOSABLE) ×4
GRASPER SUT TROCAR 14GX15 (MISCELLANEOUS) ×3 IMPLANT
KIT BASIN OR (CUSTOM PROCEDURE TRAY) ×3 IMPLANT
KIT TURNOVER KIT B (KITS) ×3 IMPLANT
NEEDLE 22X1 1/2 (OR ONLY) (NEEDLE) ×3 IMPLANT
NS IRRIG 1000ML POUR BTL (IV SOLUTION) ×3 IMPLANT
PAD ARMBOARD 7.5X6 YLW CONV (MISCELLANEOUS) ×3 IMPLANT
POUCH RETRIEVAL ECOSAC 10 (ENDOMECHANICALS) ×1 IMPLANT
POUCH RETRIEVAL ECOSAC 10MM (ENDOMECHANICALS) ×2
SCISSORS LAP 5X35 DISP (ENDOMECHANICALS) ×3 IMPLANT
SET IRRIG TUBING LAPAROSCOPIC (IRRIGATION / IRRIGATOR) ×3 IMPLANT
SLEEVE ENDOPATH XCEL 5M (ENDOMECHANICALS) ×6 IMPLANT
SPECIMEN JAR SMALL (MISCELLANEOUS) ×3 IMPLANT
STOPCOCK 4 WAY LG BORE MALE ST (IV SETS) ×3 IMPLANT
SUT ETHILON 2 0 FS 18 (SUTURE) ×3 IMPLANT
SUT MNCRL AB 4-0 PS2 18 (SUTURE) ×3 IMPLANT
TOWEL OR 17X24 6PK STRL BLUE (TOWEL DISPOSABLE) ×3 IMPLANT
TOWEL OR 17X26 10 PK STRL BLUE (TOWEL DISPOSABLE) ×3 IMPLANT
TRAY LAPAROSCOPIC MC (CUSTOM PROCEDURE TRAY) ×3 IMPLANT
TROCAR XCEL 12X100 BLDLESS (ENDOMECHANICALS) ×3 IMPLANT
TROCAR XCEL NON-BLD 5MMX100MML (ENDOMECHANICALS) ×3 IMPLANT
TUBE CONNECTING 12'X1/4 (SUCTIONS) ×1
TUBE CONNECTING 12X1/4 (SUCTIONS) ×2 IMPLANT
TUBING INSUFFLATION (TUBING) ×3 IMPLANT
WATER STERILE IRR 1000ML POUR (IV SOLUTION) ×3 IMPLANT

## 2018-02-09 NOTE — Progress Notes (Signed)
Pt transfer to OR.

## 2018-02-09 NOTE — Anesthesia Preprocedure Evaluation (Signed)
Anesthesia Evaluation  Patient identified by MRN, date of birth, ID band Patient awake    Reviewed: Allergy & Precautions, NPO status , Patient's Chart, lab work & pertinent test results  Airway Mallampati: II  TM Distance: >3 FB Neck ROM: Full    Dental no notable dental hx.    Pulmonary neg pulmonary ROS, Current Smoker,    Pulmonary exam normal breath sounds clear to auscultation       Cardiovascular hypertension, negative cardio ROS Normal cardiovascular exam Rhythm:Regular Rate:Normal     Neuro/Psych negative neurological ROS  negative psych ROS   GI/Hepatic negative GI ROS, Neg liver ROS, GERD  ,  Endo/Other  negative endocrine ROSdiabetes, Type 2, Oral Hypoglycemic Agents  Renal/GU negative Renal ROS  negative genitourinary   Musculoskeletal negative musculoskeletal ROS (+)   Abdominal   Peds negative pediatric ROS (+)  Hematology negative hematology ROS (+)   Anesthesia Other Findings   Reproductive/Obstetrics negative OB ROS                             Anesthesia Physical Anesthesia Plan  ASA: III  Anesthesia Plan: General   Post-op Pain Management:    Induction: Intravenous  PONV Risk Score and Plan: 1 and Ondansetron  Airway Management Planned: Oral ETT  Additional Equipment:   Intra-op Plan:   Post-operative Plan: Extubation in OR  Informed Consent: I have reviewed the patients History and Physical, chart, labs and discussed the procedure including the risks, benefits and alternatives for the proposed anesthesia with the patient or authorized representative who has indicated his/her understanding and acceptance.   Dental advisory given  Plan Discussed with: CRNA  Anesthesia Plan Comments:         Anesthesia Quick Evaluation

## 2018-02-09 NOTE — Anesthesia Postprocedure Evaluation (Signed)
Anesthesia Post Note  Patient: Gregory BalesStephen D Sessums  Procedure(s) Performed: LAPAROSCOPIC CHOLECYSTECTOMY WITH CHOLIANGIOGRAM (N/A Abdomen)     Patient location during evaluation: PACU Anesthesia Type: General Level of consciousness: awake and alert Pain management: pain level controlled Vital Signs Assessment: post-procedure vital signs reviewed and stable Respiratory status: spontaneous breathing, nonlabored ventilation and respiratory function stable Cardiovascular status: blood pressure returned to baseline and stable Postop Assessment: no apparent nausea or vomiting Anesthetic complications: no    Last Vitals:  Vitals:   02/09/18 0138 02/09/18 0503  BP: 101/70 101/64  Pulse: 61 (!) 59  Resp: 17 16  Temp: 36.5 C 36.6 C  SpO2: 100% 98%    Last Pain:  Vitals:   02/09/18 0700  TempSrc:   PainSc: 0-No pain                 Lowella CurbWarren Ray Marguerite Barba

## 2018-02-09 NOTE — Anesthesia Postprocedure Evaluation (Signed)
Anesthesia Post Note  Patient: Gregory Thompson  Procedure(s) Performed: ENDOSCOPIC RETROGRADE CHOLANGIOPANCREATOGRAPHY (ERCP) WITH PROPOFOL (N/A ) SPHINCTEROTOMY     Patient location during evaluation: PACU Anesthesia Type: General Level of consciousness: awake and alert Pain management: pain level controlled Vital Signs Assessment: post-procedure vital signs reviewed and stable Respiratory status: spontaneous breathing, nonlabored ventilation, respiratory function stable and patient connected to nasal cannula oxygen Cardiovascular status: blood pressure returned to baseline and stable Postop Assessment: no apparent nausea or vomiting Anesthetic complications: no    Last Vitals:  Vitals:   02/09/18 0138 02/09/18 0503  BP: 101/70 101/64  Pulse: 61 (!) 59  Resp: 17 16  Temp: 36.5 C 36.6 C  SpO2: 100% 98%    Last Pain:  Vitals:   02/09/18 0700  TempSrc:   PainSc: 0-No pain                 Elzie Sheets S

## 2018-02-09 NOTE — Transfer of Care (Signed)
Immediate Anesthesia Transfer of Care Note  Patient: Gregory Thompson  Procedure(s) Performed: LAPAROSCOPIC CHOLECYSTECTOMY WITH Magdalene PatriciaHOLIANGIOGRAM (N/A Abdomen)  Patient Location: PACU  Anesthesia Type:General  Level of Consciousness: awake, alert , oriented and patient cooperative  Airway & Oxygen Therapy: Patient Spontanous Breathing and Patient connected to nasal cannula oxygen  Post-op Assessment: Report given to RN and Post -op Vital signs reviewed and stable  Post vital signs: Reviewed and stable  Last Vitals:  Vitals Value Taken Time  BP 156/95 02/09/2018 12:10 PM  Temp    Pulse 81 02/09/2018 12:16 PM  Resp 21 02/09/2018 12:16 PM  SpO2 99 % 02/09/2018 12:16 PM  Vitals shown include unvalidated device data.  Last Pain:  Vitals:   02/09/18 0700  TempSrc:   PainSc: 0-No pain         Complications: No apparent anesthesia complications

## 2018-02-09 NOTE — Op Note (Addendum)
PATIENT:  Gregory BalesStephen D Jacome  52 y.o. male  PRE-OPERATIVE DIAGNOSIS:  Gallstones  POST-OPERATIVE DIAGNOSIS:  Gallstones  PROCEDURE:  Procedure(s): LAPAROSCOPIC CHOLECYSTECTOMY WITH CHOLIANGIOGRAM  SURGEON:  Surgeon(s): Lashawnta Burgert, De BlanchLuke Aaron, MD  ASSISTANT: Mattie MarlinJessica Focht, P.A.  ANESTHESIA:   local and general  Indications for procedure: Gregory BalesStephen D Artiaga is a 52 y.o. male with symptoms of Abdominal pain and Nausea and vomiting consistent with gallbladder disease, Confirmed by Ultrasound and CT.  Description of procedure: The patient was brought into the operative suite, placed supine. Anesthesia was administered with endotracheal tube. Patient was strapped in place and foot board was secured. All pressure points were offloaded by foam padding. The patient was prepped and draped in the usual sterile fashion.  A small incision was made to the right of the umbilicus. A 5mm trocar was inserted into the peritoneal cavity with optical entry. Pneumoperitoneum was applied with high flow low pressure. 2 5mm trocars were placed in the RUQ. A 12mm trocar was placed in the subxiphoid space. 20 ml marcaine was infused to the subxiphoid space and lateral upper right abdomen in the preperitoneal spaces. Next the patient was placed in reverse trendelenberg. The gallbladder was distended and   The gallbladder was retracted cephalad and lateral. The peritoneum was reflected off the infundibulum working lateral to medial. The cystic duct and cystic artery were identified and further dissection revealed a critical view, due to concern for choledocholithiasis a cholangiogram was performed with Rolene ArbourKumar Clamp. The cholangiogram showed right and left hepatic ducts with some dilation with a semicircular end at the common bile duct. Due to inflammation at the cystic duct common bile duct area, decision was made to ligate at the end of the infundibulum instead of doing further dissection. The infundibulum was cut open and  blunt manipulation of the common bile duct was performed to attempt to milk the stone back out the gallbladder infundibulum. The infundibulum was then ligated with 2 0 PDS endoloops  The gallbladder was removed off the liver bed with cautery. The Gallbladder was placed in a specimen bag. The gallbladder fossa was irrigated and hemostasis was applied with cautery. The gallbladder was removed via the 12mm trocar. The fascial defect was closed with interrupted 0 vicryl suture via laparoscopic trans-fascial suture passer. Pneumoperitoneum was removed, all trocar were removed. All incisions were closed with 4-0 monocryl subcuticular stitch. The patient woke from anesthesia and was brought to PACU in stable condition. All counts were correct  Findings: inflamed distended gallbladder with infundibulum wrapping around the cystic artery medial to lateral  Specimen: gallbladder  Blood loss: 100 ml  Local anesthesia: 20 ml marcaine  Complications: none.  PLAN OF CARE: Admit to inpatient   PATIENT DISPOSITION:  PACU - hemodynamically stable.  Images:     Feliciana RossettiLuke Lilliann Rossetti, M.D. General, Bariatric, & Minimally Invasive Surgery [facility]

## 2018-02-09 NOTE — Progress Notes (Signed)
Pre Procedure note for inpatients:   Elmer BalesStephen D Figuero has been scheduled for laparoscopic cholecystectomy today. The various methods of treatment have been discussed with the patient. After consideration of the risks, benefits and treatment options the patient has consented to the planned procedure.   The patient has been seen and labs reviewed. There are no changes in the patient's condition to prevent proceeding with the planned procedure today.  Recent labs:  Lab Results  Component Value Date   WBC 8.2 02/06/2018   HGB 13.9 02/06/2018   HCT 40.3 02/06/2018   PLT 262 02/06/2018   GLUCOSE 128 (H) 02/08/2018   CHOL 371 (H) 02/07/2018   TRIG 338 (H) 02/07/2018   HDL <10 (L) 02/07/2018   LDLCALC NOT CALCULATED 02/07/2018   ALT 223 (H) 02/08/2018   AST 123 (H) 02/08/2018   NA 135 02/08/2018   K 4.4 02/08/2018   CL 106 02/08/2018   CREATININE 1.34 (H) 02/08/2018   BUN 17 02/08/2018   CO2 20 (L) 02/08/2018   INR 1.05 02/06/2018   HGBA1C 5.8 (H) 01/28/2018    Rodman PickleLuke Aaron Malyah Ohlrich, MD 02/09/2018 8:14 AM

## 2018-02-09 NOTE — Progress Notes (Signed)
PROGRESS NOTE    Gregory Thompson  ZOX:[id number] DOB: 11-01-1965 DOA: 02/06/2018 PCP: Babs Sciara, MD    Brief Narrative:  52 y.o. male with a history of HTN, T2DM, HLD, IBS, gout, GERD, and gallstones who presented to [facility] with jaundice and associated N/V/D which had actually improved, though he continued with anorexia, malaise, fatigue. In the ED bilirubin was found to be 14.9, AST/ALT 143/294, AP 193, and creatinine up from baseline at 1.8 consistent with dehydration. CT abd/pelvis showed gallstones and sludge with gallbladder wall thickening and stone in gallbladder neck. Subsequent U/S showed no Murphy's sign and confirmed the above findings with question of CBD dilatation. MRCP would require general anesthesia so the patient was transferred to [facility] for further evaluation. 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 General surgery consultation has been requested.  Assessment & Plan:   Principal Problem:   Elevated LFTs Active Problems:   GERD (gastroesophageal reflux disease)   Tobacco abuse   Hypertriglyceridemia   Irritable bowel syndrome   Type 2 diabetes mellitus (HCC)   HTN (hypertension)   Acute renal injury (HCC)   Acute cholecystitis due to biliary calculus  Cholelithiasis with biliary obstruction and acute/chronic cholecystitis:  - Trend LFTs.  Seems to be improving slowly -General surgery consulted.  Patient now status post lap chole as of 02/09/2018. -Patient was continued on Zosyn empirically  AKI: Improving w/IVF's.  -Patient had been continued on IV fluid hydration.  Renal function overnight stable -Repeat basic metabolic panel in the morning  T2DM: Excellent control, last HbA1c 5.8%. -Held home hypoglycemics.  - On SSI, will decrease to sensitive scale, would consider discontinuing this pending further insulin requirements.  -Repeat basic metabolic panel in the  morning  HTN:  - Monitor while holding ARB -Blood pressure presently stable -Continue with hydralazine prn  GERD: Chronic, stable - Continue PPI as tolerated  IBS:  - Continuing hyoscyamine  -Appears to be stable presently  Hyperlipidemia:  -Held statin with elevated LFTs -Repeat liver function test in the morning  DVT prophylaxis: Lovenox subcutaneously Code Status: Full code Family Communication: Patient in room Disposition Plan: Uncertain at this time  Consultants:   General surgery  Procedures:   Lap chole on 02/09/2018 by general surgery  Antimicrobials: Anti-infectives (From admission, onward)   Start     Dose/Rate Route Frequency Ordered Stop   02/07/18 1000  [MAR Hold]  piperacillin-tazobactam (ZOSYN) IVPB 3.375 g     (MAR Hold since Wed 02/09/2018 at 0908. Reason: Transfer to a Procedural area.)   3.375 g 12.5 mL/hr over 240 Minutes Intravenous Every 8 hours 02/07/18 0838     02/07/18 0100  piperacillin-tazobactam (ZOSYN) IVPB 3.375 g     3.375 g 12.5 mL/hr over 240 Minutes Intravenous  Once 02/07/18 0014 02/07/18 1143   02/06/18 1515  piperacillin-tazobactam (ZOSYN) IVPB 3.375 g     3.375 g 100 mL/hr over 30 Minutes Intravenous  Once 02/06/18 1508 02/06/18 1545       Subjective: Without complaints at this time  Objective: Vitals:   02/09/18 1343 02/09/18 1357 02/09/18 1412 02/09/18 1428  BP: 119/89 130/80 129/77 130/82  Pulse: 86 77 79 73  Resp: (!) 22 14 16    Temp:   (!) 97.5 F (36.4 C)   TempSrc:      SpO2: 93% 97% 97% 99%  Weight:      Height:  Intake/Output Summary (Last 24 hours) at 02/09/2018 1802 Last data filed at 02/09/2018 1700 Gross per 24 hour  Intake 4827.5 ml  Output 130 ml  Net 4697.5 ml   Filed Weights   02/06/18 1837 02/07/18 1620 02/08/18 1200  Weight: 81.4 kg (179 lb 7.3 oz) 80.3 kg (177 lb 0.5 oz) 80.3 kg (177 lb 0.5 oz)    Examination:  General exam: Appears calm and comfortable  Respiratory system: Clear  to auscultation. Respiratory effort normal. Cardiovascular system: S1 & S2 heard, RRR Gastrointestinal system: Abdomen is nondistended, soft and nontender. No organomegaly or masses felt. Normal bowel sounds heard. Central nervous system: Alert and oriented. No focal neurological deficits. Extremities: Symmetric 5 x 5 power. Skin: No rashes, lesions Psychiatry: Judgement and insight appear normal. Mood & affect appropriate.   Data Reviewed: I have personally reviewed following labs and imaging studies  CBC: Recent Labs  Lab 02/06/18 1347 02/09/18 0801  WBC 8.2 9.2  NEUTROABS 5.8 7.0  HGB 13.9 12.0*  HCT 40.3 35.4*  MCV 89.8 88.3  PLT 262 290   Basic Metabolic Panel: Recent Labs  Lab 02/06/18 1347 02/07/18 0639 02/08/18 0544 02/09/18 0801  NA 133* 136 135 133*  K 4.1 4.6 4.4 4.9  CL 101 105 106 102  CO2 21* 23 20* 21*  GLUCOSE 176* 96 128* 141*  BUN 35* 26* 17 22*  CREATININE 1.86* 1.27* 1.34* 1.37*  CALCIUM 9.7 9.3 9.1 9.2   GFR: Estimated Creatinine Clearance: 62.4 mL/min (A) (by C-G formula based on SCr of 1.37 mg/dL (H)). Liver Function Tests: Recent Labs  Lab 02/06/18 1347 02/07/18 0639 02/08/18 0544 02/09/18 0801  AST 143* 129* 123* 116*  ALT 294* 251* 223* 214*  ALKPHOS 193* 201* 183* 174*  BILITOT 15.0*  14.9* 14.2* 15.4* 16.6*  PROT 8.1 7.3 7.0 7.4  ALBUMIN 3.7 3.3* 3.1* 3.2*   Recent Labs  Lab 02/06/18 1347  LIPASE 44   No results for input(s): AMMONIA in the last 168 hours. Coagulation Profile: Recent Labs  Lab 02/06/18 1347  INR 1.05   Cardiac Enzymes: Recent Labs  Lab 02/06/18 1347  TROPONINI <0.03   BNP (last 3 results) No results for input(s): PROBNP in the last 8760 hours. HbA1C: No results for input(s): HGBA1C in the last 72 hours. CBG: Recent Labs  Lab 02/08/18 1658 02/08/18 2122 02/09/18 0745 02/09/18 1432 02/09/18 1710  GLUCAP 177* 187* 154* 128* 133*   Lipid Profile: Recent Labs    02/07/18 0639  CHOL 371*   HDL <10*  LDLCALC NOT CALCULATED  TRIG 338*  CHOLHDL NOT CALCULATED   Thyroid Function Tests: No results for input(s): TSH, T4TOTAL, FREET4, T3FREE, THYROIDAB in the last 72 hours. Anemia Panel: No results for input(s): VITAMINB12, FOLATE, FERRITIN, TIBC, IRON, RETICCTPCT in the last 72 hours. Sepsis Labs: No results for input(s): PROCALCITON, LATICACIDVEN in the last 168 hours.  Recent Results (from the past 240 hour(s))  Urine culture     Status: None   Collection Time: 02/06/18  1:55 PM  Result Value Ref Range Status   Specimen Description   Final    URINE, CLEAN CATCH Performed at [facility], [address] [place], [place] [address]    Special Requests   Final    NONE Performed at [facility], [address] [place], [place] [address]    Culture   Final    NO GROWTH Performed at [facility], [address] [place], [place] [address]  Report Status 02/08/2018 FINAL  Final  Surgical pcr screen     Status: None   Collection Time: 02/06/18 10:05 PM  Result Value Ref Range Status   MRSA, PCR NEGATIVE NEGATIVE Final   Staphylococcus aureus NEGATIVE NEGATIVE Final    Comment: (NOTE) The Xpert SA Assay (FDA approved for NASAL specimens in patients 40 years of age and older), is one component of a comprehensive surveillance program. It is not intended to diagnose infection nor to guide or monitor treatment. Performed at [facility], [address], [place], [place] [address]      Radiology Studies: Dg Cholangiogram Operative  Result Date: 02/09/2018 CLINICAL DATA:  Intraoperative cholangiogram during laparoscopic cholecystectomy. EXAM: INTRAOPERATIVE CHOLANGIOGRAM FLUOROSCOPY TIME:  21 seconds COMPARISON:  ERCP-02/08/2018; CT abdomen pelvis-02/06/2018 FINDINGS: Intraoperative cholangiographic images of the right upper abdominal quadrant during laparoscopic cholecystectomy are provided for review. Surgical clips overlie the expected location of the gallbladder  fossa. Contrast injection demonstrates selective cannulation of the central aspect of the cystic duct. There is passage of contrast through the central aspect of the cystic duct with filling of a non dilated common bile duct. There is a persistent occlusive lenticular filling defect within the CBD worrisome for occlusive choledocholithiasis. There is no definitive passage of contrast beyond the mid aspect of the common bile duct. There is no opacification of the duodenum. There is minimal opacification of the central aspect of the intrahepatic biliary system which appears mildly dilated. IMPRESSION: Findings worrisome for occlusive choledocholithiasis. Further evaluation management with repeat ERCP could be performed as indicated. Electronically Signed   By: Simonne Come M.D.   On: 02/09/2018 11:43   Dg Ercp Biliary & Pancreatic Ducts  Result Date: 02/08/2018 CLINICAL DATA:  ERCP.  Concern for choledocholithiasis. EXAM: ERCP TECHNIQUE: Multiple spot images obtained with the fluoroscopic device and submitted for interpretation post-procedure. COMPARISON:  CT abdomen pelvis-02/06/2018; right upper quadrant abdominal ultrasound-02/07/2018 FLUOROSCOPY TIME:  5 minutes, 22 seconds FINDINGS: Sixteen spot intraoperative fluoroscopic images of the right upper abdominal quadrant during ERCP are provided for review Initial image demonstrates an ERCP probe overlying the right upper abdominal quadrant. There is selective cannulation and faint opacification of the common bile duct. There is faint opacification of the cystic duct which contains several nonocclusive filling defects suggestive of choledocholithiasis. There are no discrete persistent filling defects with opacified portion of the common bile duct. There is minimal opacification intrahepatic biliary system which appears mildly dilated. IMPRESSION: ERCP with presumed stones within the cystic duct but not definitively within the common bile duct as detailed above.  These images were submitted for radiologic interpretation only. Please see the procedural report for the amount of contrast and the fluoroscopy time utilized. Electronically Signed   By: Simonne Come M.D.   On: 02/08/2018 14:59    Scheduled Meds: . [MAR Hold] allopurinol  300 mg Oral Daily  . [START ON 02/10/2018] enoxaparin (LOVENOX) injection  40 mg Subcutaneous Q24H  . [MAR Hold] gemfibrozil  600 mg Oral Daily  . HYDROmorphone      . [MAR Hold] hyoscyamine  0.375 mg Oral Q breakfast  . [MAR Hold] hyoscyamine  0.375 mg Oral Q supper  . [MAR Hold] insulin aspart  0-5 Units Subcutaneous QHS  . [MAR Hold] insulin aspart  0-9 Units Subcutaneous TID WC  . [MAR Hold] pantoprazole  40 mg Oral BID   Continuous Infusions: . dextrose 5 % and 0.45 % NaCl with KCl 20 mEq/L 75 mL/hr at 02/09/18 0702  .  lactated ringers    . [MAR Hold] piperacillin-tazobactam (ZOSYN)  IV Stopped (02/09/18 1402)     LOS: 3 days   Rickey Barbara, MD [facility] Pager [phone]  If 7PM-7AM, please contact night-coverage [email or web address] Password [email or web address] 02/09/2018, 6:02 PM

## 2018-02-09 NOTE — Progress Notes (Signed)
Pt back from OR s/p lap chole with 3 lap sites with skin glued, right JP drain, alert and oriented, jaundice, refused SCD at this time, no complain of pain at this time.

## 2018-02-09 NOTE — Progress Notes (Signed)
Pt NPO midnight for lap chole , wife at the bedside, PCR done and CHG..Marland Kitchen

## 2018-02-09 NOTE — Progress Notes (Signed)
Orthopedic Tech Progress Note Patient Details:  Gregory BalesStephen D Thompson 03/03/1966 [id number]  Ortho Devices Type of Ortho Device: CAM walker Ortho Device/Splint Location: OR Drop off       Saul FordyceJennifer C Trevia Nop 02/09/2018, 1:58 PM

## 2018-02-09 NOTE — Anesthesia Procedure Notes (Signed)
Procedure Name: Intubation Date/Time: 02/09/2018 10:05 AM Performed by: Shirlyn Goltz, CRNA Pre-anesthesia Checklist: Patient identified, Emergency Drugs available, Suction available and Patient being monitored Patient Re-evaluated:Patient Re-evaluated prior to induction Oxygen Delivery Method: Circle system utilized Preoxygenation: Pre-oxygenation with 100% oxygen Induction Type: IV induction Ventilation: Mask ventilation without difficulty Laryngoscope Size: Mac and 4 Grade View: Grade I Tube type: Oral Tube size: 7.5 mm Number of attempts: 1 Airway Equipment and Method: Stylet Placement Confirmation: ETT inserted through vocal cords under direct vision,  positive ETCO2 and breath sounds checked- equal and bilateral Secured at: 20 cm Tube secured with: Tape Dental Injury: Teeth and Oropharynx as per pre-operative assessment

## 2018-02-09 NOTE — Progress Notes (Signed)
Pt for lap Chole report given toOR Nurse  Lorenda IshiharaBonnie Gibbs with consent, PCR negative, NPO midnight, CHG done, wife at the bedside

## 2018-02-10 ENCOUNTER — Encounter (HOSPITAL_COMMUNITY): Payer: Self-pay | Admitting: General Surgery

## 2018-02-10 DIAGNOSIS — K8050 Calculus of bile duct without cholangitis or cholecystitis without obstruction: Secondary | ICD-10-CM

## 2018-02-10 LAB — CBC
HCT: 34.4 % — ABNORMAL LOW (ref 39.0–52.0)
Hemoglobin: 11.7 g/dL — ABNORMAL LOW (ref 13.0–17.0)
MCH: 29.8 pg (ref 26.0–34.0)
MCHC: 34 g/dL (ref 30.0–36.0)
MCV: 87.5 fL (ref 78.0–100.0)
Platelets: 293 K/uL (ref 150–400)
RBC: 3.93 MIL/uL — ABNORMAL LOW (ref 4.22–5.81)
RDW: 14.9 % (ref 11.5–15.5)
WBC: 8.9 K/uL (ref 4.0–10.5)

## 2018-02-10 LAB — COMPREHENSIVE METABOLIC PANEL
ALK PHOS: 166 U/L — AB (ref 38–126)
ALT: 267 U/L — ABNORMAL HIGH (ref 17–63)
AST: 161 U/L — AB (ref 15–41)
Albumin: 3.1 g/dL — ABNORMAL LOW (ref 3.5–5.0)
Anion gap: 8 (ref 5–15)
BILIRUBIN TOTAL: 18.8 mg/dL — AB (ref 0.3–1.2)
BUN: 13 mg/dL (ref 6–20)
CALCIUM: 9.2 mg/dL (ref 8.9–10.3)
CO2: 25 mmol/L (ref 22–32)
Chloride: 102 mmol/L (ref 101–111)
Creatinine, Ser: 1.24 mg/dL (ref 0.61–1.24)
GFR calc Af Amer: >60 mL/min (ref >60)
Glucose, Bld: 147 mg/dL — ABNORMAL HIGH (ref 65–99)
POTASSIUM: 4.5 mmol/L (ref 3.5–5.1)
Sodium: 135 mmol/L (ref 135–145)
TOTAL PROTEIN: 7 g/dL (ref 6.5–8.1)

## 2018-02-10 LAB — GLUCOSE, CAPILLARY
GLUCOSE-CAPILLARY: 152 mg/dL — AB (ref 65–99)
GLUCOSE-CAPILLARY: 132 mg/dL — AB (ref 65–99)
GLUCOSE-CAPILLARY: 135 mg/dL — AB (ref 65–99)
Glucose-Capillary: 114 mg/dL — ABNORMAL HIGH (ref 65–99)

## 2018-02-10 NOTE — Progress Notes (Signed)
Pharmacy Antibiotic Note  Gregory BalesStephen D Thompson is a 52 y.o. male admitted on 02/06/2018 with intra-abdominal infection.  Pharmacy has been consulted for Zosyn dosing.  Now s/p lab chole for stones and choledocholithiasis.  Renal function improving; afebrile, WBC WNL.   Plan: Continue Zosyn EID 3.375gm IV Q8H Pharmacy will sign off as dosage adjustment is likely unnecessary.  Thank you for the consult!  Abx LOT per MD.   Height: 5\' 8"  (172.7 cm) Weight: 177 lb 0.5 oz (80.3 kg) IBW/kg (Calculated) : 68.4  Temp (24hrs), Avg:98.1 F (36.7 C), Min:97.5 F (36.4 C), Max:98.7 F (37.1 C)  Recent Labs  Lab 02/06/18 1347 02/07/18 0639 02/08/18 0544 02/09/18 0801 02/10/18 0729  WBC 8.2  --   --  9.2 8.9  CREATININE 1.86* 1.27* 1.34* 1.37* 1.24    Estimated Creatinine Clearance: 69 mL/min (by C-G formula based on SCr of 1.24 mg/dL).    Allergies  Allergen Reactions  . Amoxicillin-Pot Clavulanate Other (See Comments)    Abdominal pain Abdominal pain  . Eluxadoline Other (See Comments)    Low blood sugar Low blood sugar  . Azithromycin     Abdominal pain  . Cefzil [Cefprozil]     Abdominal pain  . Lisinopril     Cough has a side effect    Zosyn 6/2>>  6/2 surgical screen - negative 6/2UCx: negative   Dakarai Mcglocklin D. Laney Potashang, PharmD, BCPS, BCCCP Pager:  [phone] 02/10/2018, 9:49 AM

## 2018-02-10 NOTE — Progress Notes (Signed)
CRITICAL VALUE ALERT  Critical Value:  Bilirubin 18.8 Date & Time Notied:  02/10/2018 at 0930 Provider Notified:Kelly Osbourne, PA Orders Received/Actions taken:ERCP

## 2018-02-10 NOTE — Progress Notes (Addendum)
Daily Rounding Note  02/10/2018, 11:30 AM  LOS: 4 days   SUBJECTIVE:   Chief complaint: Pain in upper abdomen, especially the right upper quadrant and epigastric area.  The larger incision in the epigastrium is causing the most issues, pain exacerbated when he moves around.  He reminds me that there was never any pain leading up to his diagnosis of cholelithiasis subsequent ERCP and lap chole.  He remains jaundiced.  His appetite is not very good.   OBJECTIVE:         Vital signs in last 24 hours:    Temp:  [97.5 F (36.4 C)-98.7 F (37.1 C)] 98.6 F (37 C) (06/06 0958) Pulse Rate:  [64-86] 84 (06/06 0958) Resp:  [11-22] 20 (06/06 0958) BP: (114-156)/(72-95) 125/89 (06/06 0958) SpO2:  [93 %-99 %] 99 % (06/06 0958) Last BM Date: 02/08/18 Filed Weights   02/06/18 1837 02/07/18 1620 02/08/18 1200  Weight: 179 lb 7.3 oz (81.4 kg) 177 lb 0.5 oz (80.3 kg) 177 lb 0.5 oz (80.3 kg)   General: Jaundiced.  Looks tired out. Heart: RRR.  No MRG. Chest: No labored breathing or cough.  Lungs clear bilaterally. Abdomen: Soft.  Surgical scars fresh, unremarkable.  JP drain in place.  Bulb with about 15 cc of serosanguineous fluid.  Tender across the upper abdomen. Extremities: No CCE. Neuro/Psych: Alert.  Oriented x3.  No gross deficits.  Review of systems:   He has central chest pain or dyspnea with a deep breath since the surgery.  Intake/Output from previous day: 06/05 0701 - 06/06 0700 In: 4947.5 [P.O.:240; I.V.:4457.5; IV Piggyback:250] Out: 190 [Drains:90; Blood:100]  Intake/Output this shift: No intake/output data recorded.  Lab Results: Recent Labs    02/09/18 0801 02/10/18 0729  WBC 9.2 8.9  HGB 12.0* 11.7*  HCT 35.4* 34.4*  PLT 290 293   BMET Recent Labs    02/08/18 0544 02/09/18 0801 02/10/18 0729  NA 135 133* 135  K 4.4 4.9 4.5  CL 106 102 102  CO2 20* 21* 25  GLUCOSE 128* 141* 147*  BUN 17 22* 13    CREATININE 1.34* 1.37* 1.24  CALCIUM 9.1 9.2 9.2   LFT Recent Labs    02/08/18 0544 02/09/18 0801 02/10/18 0729  PROT 7.0 7.4 7.0  ALBUMIN 3.1* 3.2* 3.1*  AST 123* 116* 161*  ALT 223* 214* 267*  ALKPHOS 183* 174* 166*  BILITOT 15.4* 16.6* 18.8*   PT/INR No results for input(s): LABPROT, INR in the last 72 hours. Hepatitis Panel No results for input(s): HEPBSAG, HCVAB, HEPAIGM, HEPBIGM in the last 72 hours.  Studies/Results: Dg Cholangiogram Operative  Result Date: 02/09/2018 CLINICAL DATA:  Intraoperative cholangiogram during laparoscopic cholecystectomy. EXAM: INTRAOPERATIVE CHOLANGIOGRAM FLUOROSCOPY TIME:  21 seconds COMPARISON:  ERCP-02/08/2018; CT abdomen pelvis-02/06/2018 FINDINGS: Intraoperative cholangiographic images of the right upper abdominal quadrant during laparoscopic cholecystectomy are provided for review. Surgical clips overlie the expected location of the gallbladder fossa. Contrast injection demonstrates selective cannulation of the central aspect of the cystic duct. There is passage of contrast through the central aspect of the cystic duct with filling of a non dilated common bile duct. There is a persistent occlusive lenticular filling defect within the CBD worrisome for occlusive choledocholithiasis. There is no definitive passage of contrast beyond the mid aspect of the common bile duct. There is no opacification of the duodenum. There is minimal opacification of the central aspect of the intrahepatic biliary system which appears mildly dilated.  IMPRESSION: Findings worrisome for occlusive choledocholithiasis. Further evaluation management with repeat ERCP could be performed as indicated. Electronically Signed   By: Simonne Come M.D.   On: 02/09/2018 11:43   Dg Ercp Biliary & Pancreatic Ducts  Result Date: 02/08/2018 CLINICAL DATA:  ERCP.  Concern for choledocholithiasis. EXAM: ERCP TECHNIQUE: Multiple spot images obtained with the fluoroscopic device and submitted  for interpretation post-procedure. COMPARISON:  CT abdomen pelvis-02/06/2018; right upper quadrant abdominal ultrasound-02/07/2018 FLUOROSCOPY TIME:  5 minutes, 22 seconds FINDINGS: Sixteen spot intraoperative fluoroscopic images of the right upper abdominal quadrant during ERCP are provided for review Initial image demonstrates an ERCP probe overlying the right upper abdominal quadrant. There is selective cannulation and faint opacification of the common bile duct. There is faint opacification of the cystic duct which contains several nonocclusive filling defects suggestive of choledocholithiasis. There are no discrete persistent filling defects with opacified portion of the common bile duct. There is minimal opacification intrahepatic biliary system which appears mildly dilated. IMPRESSION: ERCP with presumed stones within the cystic duct but not definitively within the common bile duct as detailed above. These images were submitted for radiologic interpretation only. Please see the procedural report for the amount of contrast and the fluoroscopy time utilized. Electronically Signed   By: Simonne Come M.D.   On: 02/08/2018 14:59   Scheduled Meds: . allopurinol  300 mg Oral Daily  . enoxaparin (LOVENOX) injection  40 mg Subcutaneous Q24H  . gemfibrozil  600 mg Oral Daily  . hyoscyamine  0.375 mg Oral Q breakfast  . hyoscyamine  0.375 mg Oral Q supper  . insulin aspart  0-5 Units Subcutaneous QHS  . insulin aspart  0-9 Units Subcutaneous TID WC  . pantoprazole  40 mg Oral BID   Continuous Infusions: . dextrose 5 % and 0.45 % NaCl with KCl 20 mEq/L 75 mL/hr at 02/10/18 0210  . lactated ringers    . piperacillin-tazobactam (ZOSYN)  IV 3.375 g (02/10/18 0205)   PRN Meds:.acetaminophen **OR** acetaminophen, ALPRAZolam, alum & mag hydroxide-simeth, hydrALAZINE, iopamidol, morphine injection, ondansetron **OR** ondansetron (ZOFRAN) IV, oxyCODONE   ASSESMENT:   *   Choledocholithiasis.   6/4 ERCP with  cystic duct but no CBD stone.   6/5 IOC with choldocholithiasis. Cystic duct stone may have migrated in interim.   Rising t bili and transaminases.  Obvious jaundice on exam.  On Zosyn.   WBCs and temp normal.     PLAN   *  Set up for ERCP repeat tomorrow at 0945.  Eat what he feels like eating today.  N.p.o. after midnight.  Hold subcutaneous Lovenox.  Substitute PAS stockings.      Jennye Moccasin  02/10/2018, 11:30 AM Phone [phone]  I have discussed the case with the PA, and that is the plan I formulated. I personally interviewed and examined the patient. I reviewed the operative note and discussed the case personally with Dr. Sheliah Hatch earlier today.  There does appear to be a filling defect in the CBD on IOC.  As no CBD stones were seen on the ERCP images, I suspect 1 of the stones in the cystic duct may have migrated into the CBD.  The plan is for ERCP tomorrow with probable sphincterotomy and stone extraction.  The patient has scant serosanguineous fluid in the JP drain, does not look like bile.  I told the patient that in certain circumstances, a biliary stent may be necessary.  The procedure was again reviewed in detail, diagrams of  the anatomy were shown, and he is agreeable.  Total time 35 minutes given all required chart review, patient review and examination, discussion with consulting physicians.    Charlie PitterHenry L Danis III Office: [phone]

## 2018-02-10 NOTE — Progress Notes (Signed)
PROGRESS NOTE    BOW BUNTYN  ZOX:[id number] DOB: 1966-07-16 DOA: 02/06/2018 PCP: Babs Sciara, MD    Brief Narrative:  52 y.o. male with a history of HTN, T2DM, HLD, IBS, gout, GERD, and gallstones who presented to [facility] with jaundice and associated N/V/D which had actually improved, though he continued with anorexia, malaise, fatigue. In the ED bilirubin was found to be 14.9, AST/ALT 143/294, AP 193, and creatinine up from baseline at 1.8 consistent with dehydration. CT abd/pelvis showed gallstones and sludge with gallbladder wall thickening and stone in gallbladder neck. Subsequent U/S showed no Murphy's sign and confirmed the above findings with question of CBD dilatation. MRCP would require general anesthesia so the patient was transferred to [facility] for further evaluation. 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 General surgery consultation has been requested.  Assessment & Plan:   Principal Problem:   Elevated LFTs Active Problems:   GERD (gastroesophageal reflux disease)   Tobacco abuse   Hypertriglyceridemia   Irritable bowel syndrome   Type 2 diabetes mellitus (HCC)   HTN (hypertension)   Acute renal injury (HCC)   Acute cholecystitis due to biliary calculus  Cholelithiasis with biliary obstruction and acute/chronic cholecystitis:  -Trend LFTs.  Seems to be improving slowly -General surgery consulted.  Patient now status post lap chole as of 02/09/2018. -Patient had been initially continued on Zosyn empirically -Gastroenterology consulted.  Recommendations for ERCP on 02/11/2018  AKI: Improving w/IVF's.  -Patient had been continued on IV fluid hydration.  Renal function overnight stable -Repeat basic metabolic panel demonstrates improved renal function.  T2DM: Excellent control, last HbA1c 5.8%. -Held home hypoglycemics.  - On SSI, will decrease to sensitive scale, would consider  discontinuing this pending further insulin requirements.  -We will continue to monitor  HTN:  - Monitor while holding ARB -Blood pressure trend reviewed, appears stable at this time  GERD: Chronic, stable - Continued on PPI as patient tolerates  IBS:  -Continuing hyoscyamine  -Appears to be stable currently  Hyperlipidemia:  -Held statin with elevated LFTs -Recheck liver function test in the morning  DVT prophylaxis: Lovenox subcutaneously Code Status: Full code Family Communication: Patient in room Disposition Plan: Uncertain at this time  Consultants:   General surgery  Gastroenterology  Procedures:   Lap chole on 02/09/2018 by general surgery  Antimicrobials: Anti-infectives (From admission, onward)   Start     Dose/Rate Route Frequency Ordered Stop   02/07/18 1000  piperacillin-tazobactam (ZOSYN) IVPB 3.375 g     3.375 g 12.5 mL/hr over 240 Minutes Intravenous Every 8 hours 02/07/18 0838     02/07/18 0100  piperacillin-tazobactam (ZOSYN) IVPB 3.375 g     3.375 g 12.5 mL/hr over 240 Minutes Intravenous  Once 02/07/18 0014 02/07/18 1143   02/06/18 1515  piperacillin-tazobactam (ZOSYN) IVPB 3.375 g     3.375 g 100 mL/hr over 30 Minutes Intravenous  Once 02/06/18 1508 02/06/18 1545      Subjective: Reports mild abdominal discomfort  Objective: Vitals:   02/10/18 0141 02/10/18 0621 02/10/18 0958 02/10/18 1354  BP: 116/72 114/75 125/89 133/88  Pulse: 64 75 84 82  Resp: 18 17 20 20   Temp: 98.4 F (36.9 C) 98.7 F (37.1 C) 98.6 F (37 C) 98.7 F (37.1 C)  TempSrc: Oral Oral Oral Oral  SpO2: 98% 97% 99% 98%  Weight:      Height:  Intake/Output Summary (Last 24 hours) at 02/10/2018 1728 Last data filed at 02/10/2018 1707 Gross per 24 hour  Intake 2438.75 ml  Output 60 ml  Net 2378.75 ml   Filed Weights   02/06/18 1837 02/07/18 1620 02/08/18 1200  Weight: 81.4 kg (179 lb 7.3 oz) 80.3 kg (177 lb 0.5 oz) 80.3 kg (177 lb 0.5 oz)     Examination: General exam: Awake, laying in bed, in nad Respiratory system: Normal respiratory effort, no wheezing Cardiovascular system: regular rate, s1, s2 Gastrointestinal system: Soft, nondistended, positive BS Central nervous system: CN2-12 grossly intact, strength intact Extremities: Perfused, no clubbing Skin: Normal skin turgor, no notable skin lesions seen Psychiatry: Mood normal // no visual hallucinations    Data Reviewed: I have personally reviewed following labs and imaging studies  CBC: Recent Labs  Lab 02/06/18 1347 02/09/18 0801 02/10/18 0729  WBC 8.2 9.2 8.9  NEUTROABS 5.8 7.0  --   HGB 13.9 12.0* 11.7*  HCT 40.3 35.4* 34.4*  MCV 89.8 88.3 87.5  PLT 262 290 293   Basic Metabolic Panel: Recent Labs  Lab 02/06/18 1347 02/07/18 0639 02/08/18 0544 02/09/18 0801 02/10/18 0729  NA 133* 136 135 133* 135  K 4.1 4.6 4.4 4.9 4.5  CL 101 105 106 102 102  CO2 21* 23 20* 21* 25  GLUCOSE 176* 96 128* 141* 147*  BUN 35* 26* 17 22* 13  CREATININE 1.86* 1.27* 1.34* 1.37* 1.24  CALCIUM 9.7 9.3 9.1 9.2 9.2   GFR: Estimated Creatinine Clearance: 69 mL/min (by C-G formula based on SCr of 1.24 mg/dL). Liver Function Tests: Recent Labs  Lab 02/06/18 1347 02/07/18 0639 02/08/18 0544 02/09/18 0801 02/10/18 0729  AST 143* 129* 123* 116* 161*  ALT 294* 251* 223* 214* 267*  ALKPHOS 193* 201* 183* 174* 166*  BILITOT 15.0*  14.9* 14.2* 15.4* 16.6* 18.8*  PROT 8.1 7.3 7.0 7.4 7.0  ALBUMIN 3.7 3.3* 3.1* 3.2* 3.1*   Recent Labs  Lab 02/06/18 1347  LIPASE 44   No results for input(s): AMMONIA in the last 168 hours. Coagulation Profile: Recent Labs  Lab 02/06/18 1347  INR 1.05   Cardiac Enzymes: Recent Labs  Lab 02/06/18 1347  TROPONINI <0.03   BNP (last 3 results) No results for input(s): PROBNP in the last 8760 hours. HbA1C: No results for input(s): HGBA1C in the last 72 hours. CBG: Recent Labs  Lab 02/09/18 1710 02/09/18 2156  02/10/18 0734 02/10/18 1132 02/10/18 1645  GLUCAP 133* 146* 152* 114* 132*   Lipid Profile: No results for input(s): CHOL, HDL, LDLCALC, TRIG, CHOLHDL, LDLDIRECT in the last 72 hours. Thyroid Function Tests: No results for input(s): TSH, T4TOTAL, FREET4, T3FREE, THYROIDAB in the last 72 hours. Anemia Panel: No results for input(s): VITAMINB12, FOLATE, FERRITIN, TIBC, IRON, RETICCTPCT in the last 72 hours. Sepsis Labs: No results for input(s): PROCALCITON, LATICACIDVEN in the last 168 hours.  Recent Results (from the past 240 hour(s))  Urine culture     Status: None   Collection Time: 02/06/18  1:55 PM  Result Value Ref Range Status   Specimen Description   Final    URINE, CLEAN CATCH Performed at [facility], [address] [place], [place] [address]    Special Requests   Final    NONE Performed at [facility], [address] [place], [place] [address]    Culture   Final    NO GROWTH Performed at [facility], [address] [place], [place] [address]  Report Status 02/08/2018 FINAL  Final  Surgical pcr screen     Status: None   Collection Time: 02/06/18 10:05 PM  Result Value Ref Range Status   MRSA, PCR NEGATIVE NEGATIVE Final   Staphylococcus aureus NEGATIVE NEGATIVE Final    Comment: (NOTE) The Xpert SA Assay (FDA approved for NASAL specimens in patients 40 years of age and older), is one component of a comprehensive surveillance program. It is not intended to diagnose infection nor to guide or monitor treatment. Performed at [facility], [address], [place], [place] [address]      Radiology Studies: Dg Cholangiogram Operative  Result Date: 02/09/2018 CLINICAL DATA:  Intraoperative cholangiogram during laparoscopic cholecystectomy. EXAM: INTRAOPERATIVE CHOLANGIOGRAM FLUOROSCOPY TIME:  21 seconds COMPARISON:  ERCP-02/08/2018; CT abdomen pelvis-02/06/2018 FINDINGS: Intraoperative cholangiographic images of the right upper abdominal quadrant during  laparoscopic cholecystectomy are provided for review. Surgical clips overlie the expected location of the gallbladder fossa. Contrast injection demonstrates selective cannulation of the central aspect of the cystic duct. There is passage of contrast through the central aspect of the cystic duct with filling of a non dilated common bile duct. There is a persistent occlusive lenticular filling defect within the CBD worrisome for occlusive choledocholithiasis. There is no definitive passage of contrast beyond the mid aspect of the common bile duct. There is no opacification of the duodenum. There is minimal opacification of the central aspect of the intrahepatic biliary system which appears mildly dilated. IMPRESSION: Findings worrisome for occlusive choledocholithiasis. Further evaluation management with repeat ERCP could be performed as indicated. Electronically Signed   By: Simonne Come M.D.   On: 02/09/2018 11:43    Scheduled Meds: . allopurinol  300 mg Oral Daily  . gemfibrozil  600 mg Oral Daily  . hyoscyamine  0.375 mg Oral Q breakfast  . hyoscyamine  0.375 mg Oral Q supper  . insulin aspart  0-5 Units Subcutaneous QHS  . insulin aspart  0-9 Units Subcutaneous TID WC  . pantoprazole  40 mg Oral BID   Continuous Infusions: . dextrose 5 % and 0.45 % NaCl with KCl 20 mEq/L 1 mL (02/10/18 1258)  . lactated ringers    . piperacillin-tazobactam (ZOSYN)  IV 3.375 g (02/10/18 1707)     LOS: 4 days   Rickey Barbara, MD [facility] Pager [phone]  If 7PM-7AM, please contact night-coverage [email or web address] Password [facility] 02/10/2018, 5:28 PM

## 2018-02-10 NOTE — H&P (View-Only) (Signed)
Daily Rounding Note  02/10/2018, 11:30 AM  LOS: 4 days   SUBJECTIVE:   Chief complaint: Pain in upper abdomen, especially the right upper quadrant and epigastric area.  The larger incision in the epigastrium is causing the most issues, pain exacerbated when he moves around.  He reminds me that there was never any pain leading up to his diagnosis of cholelithiasis subsequent ERCP and lap chole.  He remains jaundiced.  His appetite is not very good.   OBJECTIVE:         Vital signs in last 24 hours:    Temp:  [97.5 F (36.4 C)-98.7 F (37.1 C)] 98.6 F (37 C) (06/06 0958) Pulse Rate:  [64-86] 84 (06/06 0958) Resp:  [11-22] 20 (06/06 0958) BP: (114-156)/(72-95) 125/89 (06/06 0958) SpO2:  [93 %-99 %] 99 % (06/06 0958) Last BM Date: 02/08/18 Filed Weights   02/06/18 1837 02/07/18 1620 02/08/18 1200  Weight: 179 lb 7.3 oz (81.4 kg) 177 lb 0.5 oz (80.3 kg) 177 lb 0.5 oz (80.3 kg)   General: Jaundiced.  Looks tired out. Heart: RRR.  No MRG. Chest: No labored breathing or cough.  Lungs clear bilaterally. Abdomen: Soft.  Surgical scars fresh, unremarkable.  JP drain in place.  Bulb with about 15 cc of serosanguineous fluid.  Tender across the upper abdomen. Extremities: No CCE. Neuro/Psych: Alert.  Oriented x3.  No gross deficits.  Review of systems:   He has central chest pain or dyspnea with a deep breath since the surgery.  Intake/Output from previous day: 06/05 0701 - 06/06 0700 In: 4947.5 [P.O.:240; I.V.:4457.5; IV Piggyback:250] Out: 190 [Drains:90; Blood:100]  Intake/Output this shift: No intake/output data recorded.  Lab Results: Recent Labs    02/09/18 0801 02/10/18 0729  WBC 9.2 8.9  HGB 12.0* 11.7*  HCT 35.4* 34.4*  PLT 290 293   BMET Recent Labs    02/08/18 0544 02/09/18 0801 02/10/18 0729  NA 135 133* 135  K 4.4 4.9 4.5  CL 106 102 102  CO2 20* 21* 25  GLUCOSE 128* 141* 147*  BUN 17 22* 13    CREATININE 1.34* 1.37* 1.24  CALCIUM 9.1 9.2 9.2   LFT Recent Labs    02/08/18 0544 02/09/18 0801 02/10/18 0729  PROT 7.0 7.4 7.0  ALBUMIN 3.1* 3.2* 3.1*  AST 123* 116* 161*  ALT 223* 214* 267*  ALKPHOS 183* 174* 166*  BILITOT 15.4* 16.6* 18.8*   PT/INR No results for input(s): LABPROT, INR in the last 72 hours. Hepatitis Panel No results for input(s): HEPBSAG, HCVAB, HEPAIGM, HEPBIGM in the last 72 hours.  Studies/Results: Dg Cholangiogram Operative  Result Date: 02/09/2018 CLINICAL DATA:  Intraoperative cholangiogram during laparoscopic cholecystectomy. EXAM: INTRAOPERATIVE CHOLANGIOGRAM FLUOROSCOPY TIME:  21 seconds COMPARISON:  ERCP-02/08/2018; CT abdomen pelvis-02/06/2018 FINDINGS: Intraoperative cholangiographic images of the right upper abdominal quadrant during laparoscopic cholecystectomy are provided for review. Surgical clips overlie the expected location of the gallbladder fossa. Contrast injection demonstrates selective cannulation of the central aspect of the cystic duct. There is passage of contrast through the central aspect of the cystic duct with filling of a non dilated common bile duct. There is a persistent occlusive lenticular filling defect within the CBD worrisome for occlusive choledocholithiasis. There is no definitive passage of contrast beyond the mid aspect of the common bile duct. There is no opacification of the duodenum. There is minimal opacification of the central aspect of the intrahepatic biliary system which appears mildly dilated.  IMPRESSION: Findings worrisome for occlusive choledocholithiasis. Further evaluation management with repeat ERCP could be performed as indicated. Electronically Signed   By: Simonne Come M.D.   On: 02/09/2018 11:43   Dg Ercp Biliary & Pancreatic Ducts  Result Date: 02/08/2018 CLINICAL DATA:  ERCP.  Concern for choledocholithiasis. EXAM: ERCP TECHNIQUE: Multiple spot images obtained with the fluoroscopic device and submitted  for interpretation post-procedure. COMPARISON:  CT abdomen pelvis-02/06/2018; right upper quadrant abdominal ultrasound-02/07/2018 FLUOROSCOPY TIME:  5 minutes, 22 seconds FINDINGS: Sixteen spot intraoperative fluoroscopic images of the right upper abdominal quadrant during ERCP are provided for review Initial image demonstrates an ERCP probe overlying the right upper abdominal quadrant. There is selective cannulation and faint opacification of the common bile duct. There is faint opacification of the cystic duct which contains several nonocclusive filling defects suggestive of choledocholithiasis. There are no discrete persistent filling defects with opacified portion of the common bile duct. There is minimal opacification intrahepatic biliary system which appears mildly dilated. IMPRESSION: ERCP with presumed stones within the cystic duct but not definitively within the common bile duct as detailed above. These images were submitted for radiologic interpretation only. Please see the procedural report for the amount of contrast and the fluoroscopy time utilized. Electronically Signed   By: Simonne Come M.D.   On: 02/08/2018 14:59   Scheduled Meds: . allopurinol  300 mg Oral Daily  . enoxaparin (LOVENOX) injection  40 mg Subcutaneous Q24H  . gemfibrozil  600 mg Oral Daily  . hyoscyamine  0.375 mg Oral Q breakfast  . hyoscyamine  0.375 mg Oral Q supper  . insulin aspart  0-5 Units Subcutaneous QHS  . insulin aspart  0-9 Units Subcutaneous TID WC  . pantoprazole  40 mg Oral BID   Continuous Infusions: . dextrose 5 % and 0.45 % NaCl with KCl 20 mEq/L 75 mL/hr at 02/10/18 0210  . lactated ringers    . piperacillin-tazobactam (ZOSYN)  IV 3.375 g (02/10/18 0205)   PRN Meds:.acetaminophen **OR** acetaminophen, ALPRAZolam, alum & mag hydroxide-simeth, hydrALAZINE, iopamidol, morphine injection, ondansetron **OR** ondansetron (ZOFRAN) IV, oxyCODONE   ASSESMENT:   *   Choledocholithiasis.   6/4 ERCP with  cystic duct but no CBD stone.   6/5 IOC with choldocholithiasis. Cystic duct stone may have migrated in interim.   Rising t bili and transaminases.  Obvious jaundice on exam.  On Zosyn.   WBCs and temp normal.     PLAN   *  Set up for ERCP repeat tomorrow at 0945.  Eat what he feels like eating today.  N.p.o. after midnight.  Hold subcutaneous Lovenox.  Substitute PAS stockings.      Gregory Thompson  02/10/2018, 11:30 AM Phone [phone]  I have discussed the case with the PA, and that is the plan I formulated. I personally interviewed and examined the patient. I reviewed the operative note and discussed the case personally with Dr. Sheliah Hatch earlier today.  There does appear to be a filling defect in the CBD on IOC.  As no CBD stones were seen on the ERCP images, I suspect 1 of the stones in the cystic duct may have migrated into the CBD.  The plan is for ERCP tomorrow with probable sphincterotomy and stone extraction.  The patient has scant serosanguineous fluid in the JP drain, does not look like bile.  I told the patient that in certain circumstances, a biliary stent may be necessary.  The procedure was again reviewed in detail, diagrams of  the anatomy were shown, and he is agreeable.  Total time 35 minutes given all required chart review, patient review and examination, discussion with consulting physicians.    Charlie PitterHenry L Danis III Office: [phone]

## 2018-02-10 NOTE — Progress Notes (Signed)
1 day post op  Subjective: Pt with moderate pain in RUQ. Has not had a BM or flatus. Incision site is healing well. Diet consisting of crackers and water.   Objective: Vitals:   02/10/18 0958 02/10/18 1354  BP: 125/89 133/88  Pulse: 84 82  Resp: 20 20  Temp: 98.6 F (37 C) 98.7 F (37.1 C)  SpO2: 99% 98%    Physical exam:   General: Jaundice present Heart: RRR S1 and S2 present Lungs: CTA throughout all fields Abdomen: soft and tender in RUQ. JP drain with 20cc serosanguinous fluid. Skin: Incision site healing appropriately  CMP     Component Value Date/Time   NA 135 02/10/2018 0729   NA 141 01/28/2018 1528   K 4.5 02/10/2018 0729   CL 102 02/10/2018 0729   CO2 25 02/10/2018 0729   GLUCOSE 147 (H) 02/10/2018 0729   BUN 13 02/10/2018 0729   BUN 11 01/28/2018 1528   CREATININE 1.24 02/10/2018 0729   CALCIUM 9.2 02/10/2018 0729   PROT 7.0 02/10/2018 0729   PROT 7.5 01/28/2018 1528   ALBUMIN 3.1 (L) 02/10/2018 0729   ALBUMIN 5.0 01/28/2018 1528   AST 161 (H) 02/10/2018 0729   ALT 267 (H) 02/10/2018 0729   ALKPHOS 166 (H) 02/10/2018 0729   BILITOT 18.8 (HH) 02/10/2018 0729   BILITOT 0.5 01/28/2018 1528   GFRNONAA >60 02/10/2018 0729   GFRAA >60 02/10/2018 0729      Assessment and Plan:  Choledocholithiasis: 6/4 ERCP performed without visible stone 6/5 Cholecystectomy and IOC with visible stone in CBD.  ERCP scheduled on 6/7 due to jaundice and rising total bilirubin levels. Pt currently on Zosyn. Pt NPO after midnight in preparation for surgery.

## 2018-02-11 ENCOUNTER — Inpatient Hospital Stay (HOSPITAL_COMMUNITY): Payer: 59

## 2018-02-11 ENCOUNTER — Encounter (HOSPITAL_COMMUNITY): Payer: Self-pay

## 2018-02-11 ENCOUNTER — Other Ambulatory Visit: Payer: Self-pay | Admitting: Family Medicine

## 2018-02-11 ENCOUNTER — Inpatient Hospital Stay (HOSPITAL_COMMUNITY): Payer: 59 | Admitting: Certified Registered"

## 2018-02-11 ENCOUNTER — Encounter (HOSPITAL_COMMUNITY)
Admission: EM | Disposition: A | Discharge status: Home / Self Care | Payer: Self-pay | Source: Home / Self Care | Attending: Internal Medicine

## 2018-02-11 DIAGNOSIS — R17 Unspecified jaundice: Secondary | ICD-10-CM

## 2018-02-11 DIAGNOSIS — K8050 Calculus of bile duct without cholangitis or cholecystitis without obstruction: Secondary | ICD-10-CM

## 2018-02-11 HISTORY — PX: BILIARY STENT PLACEMENT: SHX5538

## 2018-02-11 HISTORY — PX: SPHINCTEROTOMY: SHX5544

## 2018-02-11 HISTORY — PX: ENDOSCOPIC RETROGRADE CHOLANGIOPANCREATOGRAPHY (ERCP) WITH PROPOFOL: SHX5810

## 2018-02-11 HISTORY — PX: REMOVAL OF STONES: SHX5545

## 2018-02-11 LAB — COMPREHENSIVE METABOLIC PANEL
ALK PHOS: 172 U/L — AB (ref 38–126)
ALT: 246 U/L — AB (ref 17–63)
AST: 135 U/L — AB (ref 15–41)
Albumin: 3.1 g/dL — ABNORMAL LOW (ref 3.5–5.0)
Anion gap: 9 (ref 5–15)
BUN: 16 mg/dL (ref 6–20)
CO2: 25 mmol/L (ref 22–32)
CREATININE: 1.29 mg/dL — AB (ref 0.61–1.24)
Calcium: 9.5 mg/dL (ref 8.9–10.3)
Chloride: 100 mmol/L — ABNORMAL LOW (ref 101–111)
GFR calc Af Amer: >60 mL/min (ref >60)
GLUCOSE: 164 mg/dL — AB (ref 65–99)
Potassium: 4.8 mmol/L (ref 3.5–5.1)
Sodium: 134 mmol/L — ABNORMAL LOW (ref 135–145)
TOTAL PROTEIN: 7.3 g/dL (ref 6.5–8.1)
Total Bilirubin: 18.9 mg/dL (ref 0.3–1.2)

## 2018-02-11 LAB — GLUCOSE, CAPILLARY
GLUCOSE-CAPILLARY: 140 mg/dL — AB (ref 65–99)
Glucose-Capillary: 103 mg/dL — ABNORMAL HIGH (ref 65–99)
Glucose-Capillary: 133 mg/dL — ABNORMAL HIGH (ref 65–99)
Glucose-Capillary: 142 mg/dL — ABNORMAL HIGH (ref 65–99)

## 2018-02-11 SURGERY — ENDOSCOPIC RETROGRADE CHOLANGIOPANCREATOGRAPHY (ERCP) WITH PROPOFOL
Anesthesia: General

## 2018-02-11 MED ORDER — FENTANYL CITRATE (PF) 100 MCG/2ML IJ SOLN
INTRAMUSCULAR | Status: DC | PRN
  Administered 2018-02-11: 100 ug via INTRAVENOUS
  Administered 2018-02-11 (×2): 25 ug via INTRAVENOUS
  Administered 2018-02-11: 50 ug via INTRAVENOUS

## 2018-02-11 MED ORDER — INDOMETHACIN 50 MG RE SUPP: 100.0000 mg | Freq: Once | RECTAL | Status: DC

## 2018-02-11 MED ORDER — PROCHLORPERAZINE EDISYLATE 10 MG/2ML IJ SOLN: 10.0000 mg | INTRAMUSCULAR | Status: DC | PRN

## 2018-02-11 MED ORDER — ONDANSETRON HCL 4 MG/2ML IJ SOLN
INTRAMUSCULAR | Status: DC | PRN
  Administered 2018-02-11: 4 mg via INTRAVENOUS

## 2018-02-11 MED ORDER — INDOMETHACIN 50 MG RE SUPP
RECTAL | Status: AC
  Filled 2018-02-11: qty 2

## 2018-02-11 MED ORDER — INDOMETHACIN 50 MG RE SUPP
RECTAL | Status: DC | PRN
  Administered 2018-02-11: 100 mg via RECTAL

## 2018-02-11 MED ORDER — SCOPOLAMINE 1 MG/3DAYS TD PT72
MEDICATED_PATCH | TRANSDERMAL | Status: DC | PRN
  Administered 2018-02-11: 1 via TRANSDERMAL

## 2018-02-11 MED ORDER — IOPAMIDOL (ISOVUE-300) INJECTION 61%
INTRAVENOUS | Status: AC
  Filled 2018-02-11: qty 50

## 2018-02-11 MED ORDER — PROPOFOL 10 MG/ML IV BOLUS
INTRAVENOUS | Status: DC | PRN
  Administered 2018-02-11: 200 mg via INTRAVENOUS

## 2018-02-11 MED ORDER — LIDOCAINE HCL (CARDIAC) PF 100 MG/5ML IV SOSY
PREFILLED_SYRINGE | INTRAVENOUS | Status: DC | PRN
  Administered 2018-02-11: 60 mg via INTRAVENOUS

## 2018-02-11 MED ORDER — SUCCINYLCHOLINE CHLORIDE 20 MG/ML IJ SOLN
INTRAMUSCULAR | Status: DC | PRN
  Administered 2018-02-11: 100 mg via INTRAVENOUS

## 2018-02-11 MED ORDER — PHENYLEPHRINE HCL 10 MG/ML IJ SOLN
INTRAMUSCULAR | Status: DC | PRN
  Administered 2018-02-11: 80 ug via INTRAVENOUS
  Administered 2018-02-11: 40 ug via INTRAVENOUS
  Administered 2018-02-11 (×3): 80 ug via INTRAVENOUS
  Administered 2018-02-11: 40 ug via INTRAVENOUS
  Administered 2018-02-11: 80 ug via INTRAVENOUS

## 2018-02-11 MED ORDER — SODIUM CHLORIDE 0.9 % IV SOLN
INTRAVENOUS | Status: DC | PRN
  Administered 2018-02-11: 90 mL

## 2018-02-11 MED ORDER — GLUCAGON HCL RDNA (DIAGNOSTIC) 1 MG IJ SOLR
INTRAMUSCULAR | Status: AC
  Filled 2018-02-11: qty 1

## 2018-02-11 NOTE — Progress Notes (Signed)
DR Opyd ok with patient receiving Zosyn

## 2018-02-11 NOTE — Progress Notes (Signed)
Pharmacy pointed out patient has allergy to amoxicillin and inquired about the Zosyn the patient is taking. Paged Dr Antionette Charpyd about the medication.

## 2018-02-11 NOTE — Anesthesia Procedure Notes (Signed)
Procedure Name: Intubation Date/Time: 02/11/2018 11:03 AM Performed by: Cleda Daub, CRNA Pre-anesthesia Checklist: Patient identified, Emergency Drugs available, Suction available and Patient being monitored Patient Re-evaluated:Patient Re-evaluated prior to induction Oxygen Delivery Method: Circle system utilized Preoxygenation: Pre-oxygenation with 100% oxygen Induction Type: IV induction Ventilation: Mask ventilation without difficulty and Mask ventilation throughout procedure Laryngoscope Size: Mac and 3 Grade View: Grade I Tube type: Oral Tube size: 7.5 mm Number of attempts: 1 Airway Equipment and Method: Stylet Placement Confirmation: ETT inserted through vocal cords under direct vision,  positive ETCO2 and breath sounds checked- equal and bilateral Secured at: 23 cm Tube secured with: Tape Dental Injury: Teeth and Oropharynx as per pre-operative assessment

## 2018-02-11 NOTE — Anesthesia Preprocedure Evaluation (Signed)
Anesthesia Evaluation  Patient identified by MRN, date of birth, ID band Patient awake    Reviewed: Allergy & Precautions, NPO status , Patient's Chart, lab work & pertinent test results  Airway Mallampati: II  TM Distance: >3 FB Neck ROM: Full    Dental no notable dental hx.    Pulmonary neg pulmonary ROS, Current Smoker,    Pulmonary exam normal breath sounds clear to auscultation       Cardiovascular hypertension, negative cardio ROS Normal cardiovascular exam Rhythm:Regular Rate:Normal     Neuro/Psych negative neurological ROS  negative psych ROS   GI/Hepatic negative GI ROS, Neg liver ROS, GERD  ,  Endo/Other  negative endocrine ROSdiabetes, Type 2, Oral Hypoglycemic Agents  Renal/GU negative Renal ROS  negative genitourinary   Musculoskeletal negative musculoskeletal ROS (+)   Abdominal   Peds negative pediatric ROS (+)  Hematology negative hematology ROS (+)   Anesthesia Other Findings   Reproductive/Obstetrics negative OB ROS                             Anesthesia Physical  Anesthesia Plan  ASA: III  Anesthesia Plan: General   Post-op Pain Management:    Induction: Intravenous  PONV Risk Score and Plan: 1 and Treatment may vary due to age or medical condition and Ondansetron  Airway Management Planned: Oral ETT  Additional Equipment:   Intra-op Plan:   Post-operative Plan: Extubation in OR  Informed Consent: I have reviewed the patients History and Physical, chart, labs and discussed the procedure including the risks, benefits and alternatives for the proposed anesthesia with the patient or authorized representative who has indicated his/her understanding and acceptance.   Dental advisory given  Plan Discussed with: CRNA  Anesthesia Plan Comments:         Anesthesia Quick Evaluation

## 2018-02-11 NOTE — Transfer of Care (Signed)
Immediate Anesthesia Transfer of Care Note  Patient: Gregory BalesStephen D Nevin  Procedure(s) Performed: ENDOSCOPIC RETROGRADE CHOLANGIOPANCREATOGRAPHY (ERCP) WITH PROPOFOL (N/A ) SPHINCTEROTOMY BILIARY STENT PLACEMENT  Patient Location: PACU  Anesthesia Type:General  Level of Consciousness: awake, alert , oriented and patient cooperative  Airway & Oxygen Therapy: Patient Spontanous Breathing and Patient connected to face mask oxygen  Post-op Assessment: Report given to RN and Post -op Vital signs reviewed and stable  Post vital signs: Reviewed and stable  Last Vitals:  Vitals Value Taken Time  BP 125/81 02/11/2018 12:44 PM  Temp    Pulse 90 02/11/2018 12:45 PM  Resp 18 02/11/2018 12:45 PM  SpO2 100 % 02/11/2018 12:45 PM  Vitals shown include unvalidated device data.  Last Pain:  Vitals:   02/11/18 1244  TempSrc: Oral  PainSc:       Patients Stated Pain Goal: 4 (02/10/18 1206)  Complications: No apparent anesthesia complications

## 2018-02-11 NOTE — Interval H&P Note (Signed)
History and Physical Interval Note:  02/11/2018 10:28 AM  Elmer BalesStephen D Cove  has presented today for surgery, with the diagnosis of Abnormal intraoperative cholangiogram.  Rule out choledocholithiasis.  The various methods of treatment have been discussed with the patient and family. After consideration of risks, benefits and other options for treatment, the patient has consented to  Procedure(s): ENDOSCOPIC RETROGRADE CHOLANGIOPANCREATOGRAPHY (ERCP) WITH PROPOFOL (N/A) as a surgical intervention .  The patient's history has been reviewed, patient examined, no change in status, stable for surgery.  I have reviewed the patient's chart and labs.  Questions were answered to the patient's satisfaction.     Charlie PitterHenry L Danis III

## 2018-02-11 NOTE — Progress Notes (Signed)
PROGRESS NOTE    Gregory BalesStephen D Thompson  GMW:[id number] DOB: 10/20/1965 DOA: 02/06/2018 PCP: Babs SciaraLuking, Scott A, MD    Brief Narrative:  52 y.o. male with a history of HTN, T2DM, HLD, IBS, gout, GERD, and gallstones who presented to [facility] with jaundice and associated N/V/D which had actually improved, though he continued with anorexia, malaise, fatigue. In the ED bilirubin was found to be 14.9, AST/ALT 143/294, AP 193, and creatinine up from baseline at 1.8 consistent with dehydration. CT abd/pelvis showed gallstones and sludge with gallbladder wall thickening and stone in gallbladder neck. Subsequent U/S showed no Murphy's sign and confirmed the above findings with question of CBD dilatation. MRCP would require general anesthesia so the patient was [facility] evaluation. 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 General surgery consultation has been requested.  Assessment & Plan:   Principal Problem:   Elevated LFTs Active Problems:   GERD (gastroesophageal reflux disease)   Tobacco abuse   Hypertriglyceridemia   Irritable bowel syndrome   Type 2 diabetes mellitus (HCC)   HTN (hypertension)   Acute renal injury (HCC)   Acute cholecystitis due to biliary calculus   Jaundice   Choledocholithiasis  Cholelithiasis with biliary obstruction and acute/chronic cholecystitis:  -Trend LFTs.  Seems to be improving slowly -General surgery consulted.  Patient now status post lap chole as of 02/09/2018. -Patient had been initially continued on Zosyn empirically -Patient is status post ERCP on 02/11/2018 with findings suspicious for cystic duct stone now status post biliary sphincterotomy.  Biliary tree was swept with debris found.  Plastic stent was placed in the CBD. -GI recommendations for a low-fat diet.  GI to discuss with general surgery as there appears to be a stone in the dilated cystic  duct.  AKI: Improving w/IVF's.  -Renal function has improved with IV fluid hydration -Continue on hydration as tolerated  T2DM: Excellent control, last HbA1c 5.8%. -Held home hypoglycemics.  - On SSI, will decrease to sensitive scale, would consider discontinuing this pending further insulin requirements.  -Continue to monitor glucose for now  HTN:  - Monitor while holding ARB -Blood pressure presently stable.  Continue regimen  GERD: Chronic, stable - Continued on PPI as tolerated  IBS:  -Continuing hyoscyamine  -Patient stable presently  Hyperlipidemia:  -Held statin with elevated LFTs -Repeat liver function test in the morning  DVT prophylaxis: Lovenox subcutaneously Code Status: Full code Family Communication: Patient in room Disposition Plan: Uncertain at this time  Consultants:   General surgery  Gastroenterology  Procedures:   Lap chole on 02/09/2018 by general surgery  ERCP 03/03/2018  Antimicrobials: Anti-infectives (From admission, onward)   Start     Dose/Rate Route Frequency Ordered Stop   02/07/18 1000  piperacillin-tazobactam (ZOSYN) IVPB 3.375 g  Status:  Discontinued     3.375 g 12.5 mL/hr over 240 Minutes Intravenous Every 8 hours 02/07/18 0838 02/11/18 1312   02/07/18 0100  piperacillin-tazobactam (ZOSYN) IVPB 3.375 g     3.375 g 12.5 mL/hr over 240 Minutes Intravenous  Once 02/07/18 0014 02/07/18 1143   02/06/18 1515  piperacillin-tazobactam (ZOSYN) IVPB 3.375 g     3.375 g 100 mL/hr over 30 Minutes Intravenous  Once 02/06/18 1508 02/06/18 1545      Subjective: Patient reports abdominal discomfort status post ERCP today.  Objective: Vitals:   02/11/18 1244 02/11/18 1250 02/11/18 1300 02/11/18 1315  BP: 125/81 136/80 137/83 Marland Kitchen(!)  148/84  Pulse: 85 90 86 76  Resp: 17 17 18 16   Temp: 98 F (36.7 C)   98.4 F (36.9 C)  TempSrc: Oral   Oral  SpO2: 100% 98% 96% 97%  Weight:      Height:        Intake/Output Summary (Last 24  hours) at 02/11/2018 1823 Last data filed at 02/11/2018 1247 Gross per 24 hour  Intake 1000 ml  Output 145 ml  Net 855 ml   Filed Weights   02/06/18 1837 02/07/18 1620 02/08/18 1200  Weight: 81.4 kg (179 lb 7.3 oz) 80.3 kg (177 lb 0.5 oz) 80.3 kg (177 lb 0.5 oz)    Examination: General exam: Conversant, in no acute distress Respiratory system: normal chest rise, clear, no audible wheezing Cardiovascular system: regular rhythm, s1-s2 Gastrointestinal system: Nondistended, nontender, pos BS Central nervous system: No seizures, no tremors Extremities: No cyanosis, no joint deformities Skin: No rashes, no pallor Psychiatry: Affect normal // no auditory hallucinations    Data Reviewed: I have personally reviewed following labs and imaging studies  CBC: Recent Labs  Lab 02/06/18 1347 02/09/18 0801 02/10/18 0729  WBC 8.2 9.2 8.9  NEUTROABS 5.8 7.0  --   HGB 13.9 12.0* 11.7*  HCT 40.3 35.4* 34.4*  MCV 89.8 88.3 87.5  PLT 262 290 293   Basic Metabolic Panel: Recent Labs  Lab 02/07/18 0639 02/08/18 0544 02/09/18 0801 02/10/18 0729 02/11/18 0915  NA 136 135 133* 135 134*  K 4.6 4.4 4.9 4.5 4.8  CL 105 106 102 102 100*  CO2 23 20* 21* 25 25  GLUCOSE 96 128* 141* 147* 164*  BUN 26* 17 22* 13 16  CREATININE 1.27* 1.34* 1.37* 1.24 1.29*  CALCIUM 9.3 9.1 9.2 9.2 9.5   GFR: Estimated Creatinine Clearance: 66.3 mL/min (A) (by C-G formula based on SCr of 1.29 mg/dL (H)). Liver Function Tests: Recent Labs  Lab 02/07/18 0639 02/08/18 0544 02/09/18 0801 02/10/18 0729 02/11/18 0915  AST 129* 123* 116* 161* 135*  ALT 251* 223* 214* 267* 246*  ALKPHOS 201* 183* 174* 166* 172*  BILITOT 14.2* 15.4* 16.6* 18.8* 18.9*  PROT 7.3 7.0 7.4 7.0 7.3  ALBUMIN 3.3* 3.1* 3.2* 3.1* 3.1*   Recent Labs  Lab 02/06/18 1347  LIPASE 44   No results for input(s): AMMONIA in the last 168 hours. Coagulation Profile: Recent Labs  Lab 02/06/18 1347  INR 1.05   Cardiac Enzymes: Recent  Labs  Lab 02/06/18 1347  TROPONINI <0.03   BNP (last 3 results) No results for input(s): PROBNP in the last 8760 hours. HbA1C: No results for input(s): HGBA1C in the last 72 hours. CBG: Recent Labs  Lab 02/10/18 1645 02/10/18 2127 02/11/18 0728 02/11/18 1320 02/11/18 1751  GLUCAP 132* 135* 140* 103* 133*   Lipid Profile: No results for input(s): CHOL, HDL, LDLCALC, TRIG, CHOLHDL, LDLDIRECT in the last 72 hours. Thyroid Function Tests: No results for input(s): TSH, T4TOTAL, FREET4, T3FREE, THYROIDAB in the last 72 hours. Anemia Panel: No results for input(s): VITAMINB12, FOLATE, FERRITIN, TIBC, IRON, RETICCTPCT in the last 72 hours. Sepsis Labs: No results for input(s): PROCALCITON, LATICACIDVEN in the last 168 hours.  Recent Results (from the past 240 hour(s))  Urine culture     Status: None   Collection Time: 02/06/18  1:55 PM  Result Value Ref Range Status   Specimen Description   Final    URINE, CLEAN CATCH Performed at [facility], [address], [place], [place] [address]  Special Requests   Final    NONE Performed at [facility], [address], [place], [place] [address]    Culture   Final    NO GROWTH Performed at [facility], [address], [place], [place] [address]    Report Status 02/08/2018 FINAL  Final  Surgical pcr screen     Status: None   Collection Time: 02/06/18 10:05 PM  Result Value Ref Range Status   MRSA, PCR NEGATIVE NEGATIVE Final   Staphylococcus aureus NEGATIVE NEGATIVE Final    Comment: (NOTE) The Xpert SA Assay (FDA approved for NASAL specimens in patients 29 years of age and older), is one component of a comprehensive surveillance program. It is not intended to diagnose infection nor to guide or monitor treatment. Performed at [facility], [address], [place], [place] [address]      Radiology Studies: Dg Ercp Biliary & Pancreatic Ducts  Result Date: 02/11/2018 CLINICAL DATA:  52 year old male undergoing ERCP for  choledocholithiasis EXAM: ERCP TECHNIQUE: Multiple spot images obtained with the fluoroscopic device and submitted for interpretation post-procedure. FLUOROSCOPY TIME:  Fluoroscopy Time:  7 minutes 5 seconds COMPARISON:  Intraoperative cholangiogram 02/09/2018 FINDINGS: A total of 29 intraoperative saved images were obtained and submitted for review. The images demonstrate a flexible endoscope in the descending duodenum with deep wire cannulation of the left intrahepatic ducts. Cholangiogram is performed showing mild intra and extrahepatic biliary ductal dilatation. There appears to be a filling defect at the junction of the cystic duct and common bile duct subsequent images demonstrate sphincterotomy and balloon sweep of the common duct. Final image demonstrates placement of a plastic biliary stent. The filling defect remains present within the cystic duct. IMPRESSION: The rounded filling defect seen in the common bile duct on the prior intraoperative cholangiogram is now located within the cystic duct remanent. Placement of a plastic biliary stent. These images were submitted for radiologic interpretation only. Please see the procedural report for the amount of contrast and the fluoroscopy time utilized. Electronically Signed   By: Malachy Moan M.D.   On: 02/11/2018 13:22    Scheduled Meds: . allopurinol  300 mg Oral Daily  . gemfibrozil  600 mg Oral Daily  . hyoscyamine  0.375 mg Oral Q breakfast  . hyoscyamine  0.375 mg Oral Q supper  . indomethacin  100 mg Rectal Once  . insulin aspart  0-5 Units Subcutaneous QHS  . insulin aspart  0-9 Units Subcutaneous TID WC  . pantoprazole  40 mg Oral BID   Continuous Infusions: . dextrose 5 % and 0.45 % NaCl with KCl 20 mEq/L 75 mL/hr at 02/11/18 0229  . lactated ringers Stopped (02/11/18 1302)     LOS: 5 days   Rickey Barbara, MD [facility] Pager [phone]  If 7PM-7AM, please contact night-coverage [email or web address] Password  [facility] 02/11/2018, 6:23 PM

## 2018-02-11 NOTE — Progress Notes (Signed)
CRITICAL VALUE ALERT  Critical Value:  T bili 18.9   Date & Time Notied: 10:45 AM  Provider Notified: Dr. Rhona Leavenshiu  Orders Received/Actions taken: Waiting for orders, expected lab value, had been elevated

## 2018-02-11 NOTE — Op Note (Signed)
[facility] Patient Name: Gregory Thompson Procedure Date : 02/11/2018 MRN: [id number] Attending MD: Estill Cotta. Loletha Carrow , MD Date of Birth: Sep 11, 1965 CSN: [id number] Age: 52 Admit Type: Inpatient Procedure:                ERCP Indications:              Filling defect on intraoperative cholangiogram, For                            therapy of bile duct stone(s), Jaundice Providers:                Mallie Mussel L. Loletha Carrow, MD, Cleda Daub, RN, Laurena Spies, Technician Referring MD:             Gurney Maxin, MD Medicines:                General Anesthesia, Indomethacin 100 mg PR,                            Standing dose zosyn Complications:            No immediate complications. Estimated Blood Loss:     Estimated blood loss: none. Procedure:                Pre-Anesthesia Assessment:                           - Prior to the procedure, a History and Physical                            was performed, and patient medications and                            allergies were reviewed. The patient's tolerance of                            previous anesthesia was also reviewed. The risks                            and benefits of the procedure and the sedation                            options and risks were discussed with the patient.                            All questions were answered, and informed consent                            was obtained. Prior Anticoagulants: The patient has                            taken no previous anticoagulant or antiplatelet  agents. ASA Grade Assessment: III - A patient with                            severe systemic disease. After reviewing the risks                            and benefits, the patient was deemed in                            satisfactory condition to undergo the procedure.                           After obtaining informed consent, the scope was                            passed  under direct vision. Throughout the                            procedure, the patient's blood pressure, pulse, and                            oxygen saturations were monitored continuously. The                            Duodenoscope was introduced through the mouth, and                            used to inject contrast into and used to inject                            contrast into the bile duct. The ERCP was                            accomplished without difficulty. The patient                            tolerated the procedure well. Scope In: Scope Out: Findings:      A scout film of the abdomen was obtained. An external drain was seen in       the area of the right upper quadrant of the abdomen. The esophagus was       successfully intubated under direct vision. The scope was advanced to a       normal major papilla in the descending duodenum without detailed       examination of the pharynx, larynx and associated structures, and upper       GI tract. The upper GI tract was grossly normal. There was copious clear       bile in the stomach. 0.035 inch x 260 cm straight Hydra Jagwire was       passed into the biliary tree. The traction (standard) sphincterotome was       passed over the guidewire and the bile duct was then deeply cannulated.       Contrast was injected. I personally interpreted the bile duct images.       There was brisk  flow of contrast through the ducts. Image quality was       excellent. Contrast extended to the hepatic ducts. Opacification of the       entire biliary tree was successful. The maximum diameter of the ducts       was 6 mm. The cystic duct still contained filling defect(s) thought to       be a mobile stone (moved with contrast injection and suction). It was       too large to pass into the CBD, and it was felt it may be intermittently       obstructing the junction of the cystic duct and CBD. The intrahepatic       ducts were normal. No contrast  extravasation was seen. A short biliary       sphincterotomy was made with a traction (standard) sphincterotome using       blended current. There was no post-sphincterotomy bleeding. The biliary       tree was swept with a 44m balloon starting at the bifurcation. Scant       dark flecks of debris were swept from the duct on 2 passes. Occlusion       cholangiogram confirmed no stones in CBD. One 7 Fr by 7 cm plastic stent       with a single external flap and a single internal flap was placed 6 cm       into the common bile duct. Bile flowed through the stent. The stent was       in good position. The total fluoroscopy exposure time was 7 minutes and       4 seconds. Impression:               .                           - The examination was suspicious for a cystic duct                            stone.                           - A biliary sphincterotomy was performed.                           - The biliary tree was swept and debris was found.                           - One plastic stent was placed into the common bile                            duct. Recommendation:           - Low fat diet.                           - Follow LFTs daily while hospitalized.                           If surgical service agreeable, discontinue zosyn in  case it is contributing to intrahepatic cholestasis                            (bilirubin elevated out of proportion to Alk Phos).                           - Review images with surgical consultant, as there                            appears to be a stone in the dilated cystic duct. Procedure Code(s):        --- Professional ---                           [phone], Endoscopic retrograde                            cholangiopancreatography (ERCP); with placement of                            endoscopic stent into biliary or pancreatic duct,                            including pre- and post-dilation and guide wire                             passage, when performed, including sphincterotomy,                            when performed, each stent                           [id number], Endoscopic retrograde                            cholangiopancreatography (ERCP); with removal of                            calculi/debris from biliary/pancreatic duct(s) Diagnosis Code(s):        --- Professional ---                           R93.2, Abnormal findings on diagnostic imaging of                            liver and biliary tract                           K80.50, Calculus of bile duct without cholangitis                            or cholecystitis without obstruction                           R17, Unspecified jaundice CPT copyright 2017 American [employer] Association. All rights reserved. The codes documented in this report are preliminary and upon coder review  may  be revised to meet current compliance requirements. Henry L. Loletha Carrow, MD 02/11/2018 12:50:49 PM This report has been signed electronically. Number of Addenda: 0

## 2018-02-12 LAB — COMPREHENSIVE METABOLIC PANEL
ALT: 198 U/L — AB (ref 17–63)
AST: 108 U/L — AB (ref 15–41)
Albumin: 2.6 g/dL — ABNORMAL LOW (ref 3.5–5.0)
Alkaline Phosphatase: 170 U/L — ABNORMAL HIGH (ref 38–126)
Anion gap: 6 (ref 5–15)
BILIRUBIN TOTAL: 15.9 mg/dL — AB (ref 0.3–1.2)
BUN: 16 mg/dL (ref 6–20)
CO2: 27 mmol/L (ref 22–32)
CREATININE: 1.32 mg/dL — AB (ref 0.61–1.24)
Calcium: 9.1 mg/dL (ref 8.9–10.3)
Chloride: 103 mmol/L (ref 101–111)
GFR calc Af Amer: >60 mL/min (ref >60)
Glucose, Bld: 138 mg/dL — ABNORMAL HIGH (ref 65–99)
POTASSIUM: 4.7 mmol/L (ref 3.5–5.1)
Sodium: 136 mmol/L (ref 135–145)
Total Protein: 6.4 g/dL — ABNORMAL LOW (ref 6.5–8.1)

## 2018-02-12 LAB — GLUCOSE, CAPILLARY: Glucose-Capillary: 141 mg/dL — ABNORMAL HIGH (ref 65–99)

## 2018-02-12 MED ORDER — OXYCODONE HCL 5 MG PO TABS: 5.0000 mg | ORAL_TABLET | ORAL | 0 refills | Status: DC | PRN

## 2018-02-12 NOTE — Progress Notes (Signed)
Patient ID: Gregory Thompson, male   DOB: 1965-10-31, 52 y.o.   MRN: [id number] 1 Day Post-Op   Subjective: Feels better today.  Sore.  No major complaints.  Objective: Vital signs in last 24 hours: Temp:  [98 F (36.7 C)-98.7 F (37.1 C)] 98.1 F (36.7 C) (06/08 0527) Pulse Rate:  [61-90] 61 (06/08 0527) Resp:  [16-21] 17 (06/08 0527) BP: (103-148)/(71-84) 104/71 (06/08 0527) SpO2:  [96 %-100 %] 100 % (06/08 0527) Last BM Date: 02/08/18  Intake/Output from previous day: 06/07 0701 - 06/08 0700 In: 4481 [P.O.:120; I.V.:1375] Out: 65 [Drains:65] Intake/Output this shift: Total I/O In: -  Out: 40 [Drains:40]  General appearance: alert, cooperative and no distress GI: Mild incisional tenderness.  Nondistended.  JP drainage nonbilious. Incision/Wound: No erythema or drainage.  Lab Results:  Recent Labs    02/10/18 0729  WBC 8.9  HGB 11.7*  HCT 34.4*  PLT 293   BMET Recent Labs    02/11/18 0915 02/12/18 0454  NA 134* 136  K 4.8 4.7  CL 100* 103  CO2 25 27  GLUCOSE 164* 138*  BUN 16 16  CREATININE 1.29* 1.32*  CALCIUM 9.5 9.1   Hepatic Function Latest Ref Rng & Units 02/12/2018 02/11/2018 02/10/2018  Total Protein 6.5 - 8.1 g/dL 6.4(L) 7.3 7.0  Albumin 3.5 - 5.0 g/dL 2.6(L) 3.1(L) 3.1(L)  AST 15 - 41 U/L 108(H) 135(H) 161(H)  ALT 17 - 63 U/L 198(H) 246(H) 267(H)  Alk Phosphatase 38 - 126 U/L 170(H) 172(H) 166(H)  Total Bilirubin 0.3 - 1.2 mg/dL 15.9(H) 18.9(HH) 18.8(HH)  Bilirubin, Direct 0.1 - 0.5 mg/dL - - -    Studies/Results: Dg Ercp Biliary & Pancreatic Ducts  Result Date: 02/11/2018 CLINICAL DATA:  52 year old male undergoing ERCP for choledocholithiasis EXAM: ERCP TECHNIQUE: Multiple spot images obtained with the fluoroscopic device and submitted for interpretation post-procedure. FLUOROSCOPY TIME:  Fluoroscopy Time:  7 minutes 5 seconds COMPARISON:  Intraoperative cholangiogram 02/09/2018 FINDINGS: A total of 29 intraoperative saved images were obtained  and submitted for review. The images demonstrate a flexible endoscope in the descending duodenum with deep wire cannulation of the left intrahepatic ducts. Cholangiogram is performed showing mild intra and extrahepatic biliary ductal dilatation. There appears to be a filling defect at the junction of the cystic duct and common bile duct subsequent images demonstrate sphincterotomy and balloon sweep of the common duct. Final image demonstrates placement of a plastic biliary stent. The filling defect remains present within the cystic duct. IMPRESSION: The rounded filling defect seen in the common bile duct on the prior intraoperative cholangiogram is now located within the cystic duct remanent. Placement of a plastic biliary stent. These images were submitted for radiologic interpretation only. Please see the procedural report for the amount of contrast and the fluoroscopy time utilized. Electronically Signed   By: Gregory Thompson M.D.   On: 02/11/2018 13:22    Anti-infectives: Anti-infectives (From admission, onward)   Start     Dose/Rate Route Frequency Ordered Stop   02/07/18 1000  piperacillin-tazobactam (ZOSYN) IVPB 3.375 g  Status:  Discontinued     3.375 g 12.5 mL/hr over 240 Minutes Intravenous Every 8 hours 02/07/18 0838 02/11/18 1312   02/07/18 0100  piperacillin-tazobactam (ZOSYN) IVPB 3.375 g     3.375 g 12.5 mL/hr over 240 Minutes Intravenous  Once 02/07/18 0014 02/07/18 1143   02/06/18 1515  piperacillin-tazobactam (ZOSYN) IVPB 3.375 g     3.375 g 100 mL/hr over 30 Minutes Intravenous  Once  02/06/18 1508 02/06/18 1545      Assessment/Plan: s/p Procedure(s): Laparoscopic cholecystectomy with intraoperative cholangiogram  ENDOSCOPIC RETROGRADE CHOLANGIOPANCREATOGRAPHY (ERCP) WITH PROPOFOL SPHINCTEROTOMY BILIARY STENT PLACEMENT  Stable today.  LFT's  trending down slightly.  Should be okay for discharge when felt ready by medicine/GI. Would remove drain prior to discharge.     LOS: 6 days    Gregory Thompson 02/12/2018

## 2018-02-12 NOTE — Progress Notes (Signed)
Patient was given discharge instructions. Patient verbalized understanding. Patient drain was removed per order, patient tolerated removal well. Patient left unit in stable condition.

## 2018-02-12 NOTE — Progress Notes (Addendum)
Daily Rounding Note  02/12/2018, 8:05 AM  LOS: 6 days   SUBJECTIVE:   Chief complaint: jaundice  None.  abd pain improved, still sore with twisting or some movement of torso.  No n/v.  Hunrgy yesterday, not hungry this AM.  Diet is full liquids.  Brown stool this AM.       ROS: respirophasic RUQ and chest pain from drain. No dysuria  OBJECTIVE:         Vital signs in last 24 hours:    Temp:  [98 F (36.7 C)-98.7 F (37.1 C)] 98.1 F (36.7 C) (06/08 0527) Pulse Rate:  [61-90] 61 (06/08 0527) Resp:  [16-21] 17 (06/08 0527) BP: (103-148)/(71-84) 104/71 (06/08 0527) SpO2:  [96 %-100 %] 100 % (06/08 0527) Last BM Date: 02/08/18 Filed Weights   02/06/18 1837 02/07/18 1620 02/08/18 1200  Weight: 179 lb 7.3 oz (81.4 kg) 177 lb 0.5 oz (80.3 kg) 177 lb 0.5 oz (80.3 kg)   General: looks jaundiced but not ill.     Heart: RRR Chest: clear bil.   Abdomen: soft, JP drain with serosanguinous effluent.  Scars intact.  Tender across RUQ.  Active BS  Extremities: No CCE Neuro/Psych:  Alert, oriented x 3.  No weakness, tremor or deficits.    Intake/Output from previous day: 06/07 0701 - 06/08 0700 In: 1495 [P.O.:120; I.V.:1375] Out: 65 [Drains:65]  Intake/Output this shift: No intake/output data recorded.  Lab Results: Recent Labs    02/10/18 0729  WBC 8.9  HGB 11.7*  HCT 34.4*  PLT 293   BMET Recent Labs    02/10/18 0729 02/11/18 0915 02/12/18 0454  NA 135 134* 136  K 4.5 4.8 4.7  CL 102 100* 103  CO2 25 25 27   GLUCOSE 147* 164* 138*  BUN 13 16 16   CREATININE 1.24 1.29* 1.32*  CALCIUM 9.2 9.5 9.1   LFT Recent Labs    02/10/18 0729 02/11/18 0915 02/12/18 0454  PROT 7.0 7.3 6.4*  ALBUMIN 3.1* 3.1* 2.6*  AST 161* 135* 108*  ALT 267* 246* 198*  ALKPHOS 166* 172* 170*  BILITOT 18.8* 18.9* 15.9*   PT/INR No results for input(s): LABPROT, INR in the last 72 hours. Hepatitis Panel No results for  input(s): HEPBSAG, HCVAB, HEPAIGM, HEPBIGM in the last 72 hours.  Studies/Results: Dg Ercp Biliary & Pancreatic Ducts  Result Date: 02/11/2018 CLINICAL DATA:  52 year old male undergoing ERCP for choledocholithiasis EXAM: ERCP TECHNIQUE: Multiple spot images obtained with the fluoroscopic device and submitted for interpretation post-procedure. FLUOROSCOPY TIME:  Fluoroscopy Time:  7 minutes 5 seconds COMPARISON:  Intraoperative cholangiogram 02/09/2018 FINDINGS: A total of 29 intraoperative saved images were obtained and submitted for review. The images demonstrate a flexible endoscope in the descending duodenum with deep wire cannulation of the left intrahepatic ducts. Cholangiogram is performed showing mild intra and extrahepatic biliary ductal dilatation. There appears to be a filling defect at the junction of the cystic duct and common bile duct subsequent images demonstrate sphincterotomy and balloon sweep of the common duct. Final image demonstrates placement of a plastic biliary stent. The filling defect remains present within the cystic duct. IMPRESSION: The rounded filling defect seen in the common bile duct on the prior intraoperative cholangiogram is now located within the cystic duct remanent. Placement of a plastic biliary stent. These images were submitted for radiologic interpretation only. Please see the procedural report for the amount of contrast and the fluoroscopy time utilized. Electronically Signed  By: Malachy MoanHeath  McCullough M.D.   On: 02/11/2018 13:22   Scheduled Meds: . allopurinol  300 mg Oral Daily  . gemfibrozil  600 mg Oral Daily  . hyoscyamine  0.375 mg Oral Q breakfast  . hyoscyamine  0.375 mg Oral Q supper  . indomethacin  100 mg Rectal Once  . insulin aspart  0-5 Units Subcutaneous QHS  . insulin aspart  0-9 Units Subcutaneous TID WC  . pantoprazole  40 mg Oral BID   Continuous Infusions: . dextrose 5 % and 0.45 % NaCl with KCl 20 mEq/L 75 mL/hr at 02/11/18 1931  .  lactated ringers Stopped (02/11/18 1302)   PRN Meds:.acetaminophen **OR** acetaminophen, ALPRAZolam, alum & mag hydroxide-simeth, hydrALAZINE, iopamidol, morphine injection, ondansetron **OR** ondansetron (ZOFRAN) IV, oxyCODONE, prochlorperazine   ASSESMENT:   *  Choledocholithiasis.  On IOC.   ERCP 6/4:stone in cystic duct, no CBD stone.   ERCP 6/7: Sphincterotomy, suspicious for cystic duct stone and cystic duct dilation, biliary duct swept, plastic stent placed to CBD.   Off abx (zosyn specifically).  LFTs overall improved.     *  S/p lap chole 02/09/18    PLAN   *   From GI perspective, OK for discharge home.  Dr Myrtie Neitheranis' office will contact pt with follow up appts.  Ultimately will be removing CBD stent.    No abx.  [facility] and discussed benefits of low fat diet in coming weeks until his digestive sxs adjust to post cholecystectomy state.      Jennye MoccasinSarah Gribbin  02/12/2018, 8:05 AM Phone [phone]  I have discussed the case with the PA, and that is the plan I formulated. I personally interviewed and examined the patient.  Bilirubin decreasing. OK for home with external drain and biliary stent. He has PCP appt soon for LFTs, I will be in touch with them.  Stent removal in 4-6 weeks, timing dependent upon surgical plan for apparent stone remaining in cystic duct.    Charlie PitterHenry L Danis III Office: [phone]

## 2018-02-12 NOTE — Discharge Instructions (Signed)
[facility] ______[facility] LAPAROSCOPIC SURGERY: POST OP INSTRUCTIONS Always review your discharge instruction sheet given to you by the facility where your surgery was performed. IF YOU HAVE DISABILITY OR FAMILY LEAVE FORMS, YOU MUST BRING THEM TO THE OFFICE FOR PROCESSING.   DO NOT GIVE THEM TO YOUR DOCTOR.  1. A prescription for pain medication may be given to you upon discharge.  Take your pain medication as prescribed, if needed.  If narcotic pain medicine is not needed, then you may take acetaminophen (Tylenol) or ibuprofen (Advil) as needed. 2. Take your usually prescribed medications unless otherwise directed. 3. If you need a refill on your pain medication, please contact your pharmacy.  They will contact our office to request authorization. Prescriptions will not be filled after 5pm or on week-ends. 4. You should follow a light diet the first few days after arrival home, such as soup and crackers, etc.  Be sure to include lots of fluids daily. 5. Most patients will experience some swelling and bruising in the area of the incisions.  Ice packs will help.  Swelling and bruising can take several days to resolve.  6. It is common to experience some constipation if taking pain medication after surgery.  Increasing fluid intake and taking a stool softener (such as Colace) will usually help or prevent this problem from occurring.  A mild laxative (Milk of Magnesia or Miralax) should be taken according to package instructions if there are no bowel movements after 48 hours. 7. Unless discharge instructions indicate otherwise, you may remove your bandages 24-48 hours after surgery, and you may shower at that time.  You may have steri-strips (small skin tapes) in place directly over the incision.  These strips should be left on the skin for 7-10 days.  If your surgeon used skin glue on the incision, you may shower in 24 hours.  The glue will flake off over the next 2-3 weeks.  Any sutures or  staples will be removed at the office during your follow-up visit. 8. ACTIVITIES:  You may resume regular (light) daily activities beginning the next day--such as daily self-care, walking, climbing stairs--gradually increasing activities as tolerated.  You may have sexual intercourse when it is comfortable.  Refrain from any heavy lifting or straining until approved by your doctor. a. You may drive when you are no longer taking prescription pain medication, you can comfortably wear a seatbelt, and you can safely maneuver your car and apply brakes. b. RETURN TO WORK:  _____Approximately 2 weeks _____________________________________________________ 9. You should see your doctor in the office for a follow-up appointment approximately 2-3 weeks after your surgery.  Make sure that you call for this appointment within a day or two after you arrive home to insure a convenient appointment time. 10. OTHER INSTRUCTIONS: _______________________________ cover drain site with Band-Aid daily.  May shower.  ___________________________________________________________________________________________ __________________________________________________________________________________________________________________________ WHEN TO CALL YOUR DOCTOR: 1. Fever over 101.0 2. Inability to urinate 3. Continued bleeding from incision. 4. Increased pain, redness, or drainage from the incision. 5. Increasing abdominal pain  The clinic staff is available to answer your questions during regular business hours.  Please dont hesitate to call and ask to speak to one of the nurses for clinical concerns.  If you have a medical emergency, go to the nearest emergency room or call 911.  [facility] is always on call at the hospital. [address], [address], [place], [place]  [id number] ? [id number], [place],  [place]   [address] [phone] ? [phone] ? FAX [phone] Web site:  [email or web address]

## 2018-02-12 NOTE — Discharge Summary (Addendum)
Physician Discharge Summary  Gregory Thompson ZOX:[id number] DOB: Nov 26, 1965 DOA: 02/06/2018  PCP: Babs Sciara, MD  Admit date: 02/06/2018 Discharge date: 02/12/2018  Admitted From: Home Disposition:  Home  Recommendations for Outpatient Follow-up:  1. Follow up with PCP in 1-2 weeks 2. Follow up with GI as scheduled 3. Follow up with General Surgery as scheduled  Discharge Condition:Improved CODE STATUS:Full Diet recommendation: Low fat   Brief/Interim Summary: 51 y.o.malewith a history of HTN, T2DM, HLD, IBS, gout, GERD, and gallstones who presented to [facility] with jaundice and associated N/V/D which had actually improved, though he continued with anorexia, malaise, fatigue. In the ED bilirubin was found to be 14.9, AST/ALT 143/294, AP 193, and creatinine up from baseline at 1.8 consistent with dehydration. CT abd/pelvis showed gallstones and sludge with gallbladder wall thickening and stone in gallbladder neck. Subsequent U/S showed no Murphy's sign and confirmed the above findings with question of CBD dilatation. MRCP would require general anesthesia so the patient was transferred to [facility] for further evaluation. 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 General surgery consultation has been requested.   Cholelithiasis with biliary obstruction and acute/chronic cholecystitis:  -General surgery consulted.  Patient now status post lap chole as of 02/09/2018. -Patient had been initially continued on Zosyn empirically -Patient is status post ERCP on 02/11/2018 with findings suspicious for cystic duct stone now status post biliary sphincterotomy.  Biliary tree was swept with debris found.  Plastic stent was placed in the CBD. -GI recommendations for a low-fat diet.  GI to discuss with general surgery as there appears to be a stone in the dilated cystic duct. -Patient stable for discharge today per GI and general  surgery with recommendations for very close outpatient follow-up.  AKI:  -Renal function has improved with IV fluid hydration -Continue to encourage p.o. intake on hospital discharge  T2DM: Excellent control, last HbA1c 5.8%. -Held home hypoglycemics while in hospital -Patient was continued on sliding scale insulin while in hospital setting   HTN:  -Blood pressure remained stable this hospital course  GERD: Chronic, stable - Continued on PPI as tolerated  IBS:  -Continuing hyoscyamine  -Patient stable presently  Hyperlipidemia:  -Held statin with elevated LFTs -Liver function test improving    Discharge Diagnoses:  Principal Problem:   Elevated LFTs Active Problems:   GERD (gastroesophageal reflux disease)   Tobacco abuse   Hypertriglyceridemia   Irritable bowel syndrome   Type 2 diabetes mellitus (HCC)   HTN (hypertension)   Acute renal injury (HCC)   Acute cholecystitis due to biliary calculus   Jaundice   Choledocholithiasis    Discharge Instructions   Allergies as of 02/12/2018      Reactions   Eluxadoline Other (See Comments)   Low blood sugar Low blood sugar   Azithromycin    Abdominal pain   Cefzil [cefprozil]    Abdominal pain   Lisinopril    Cough has a side effect      Medication List    STOP taking these medications   losartan 50 MG tablet Commonly known as:  COZAAR   pravastatin 40 MG tablet Commonly known as:  PRAVACHOL   TYLENOL ARTHRITIS PAIN 650 MG CR tablet Generic drug:  acetaminophen     TAKE these medications   allopurinol 300 MG tablet Commonly known as:  ZYLOPRIM Take 1 tablet (300 mg total) by mouth daily.   ALPRAZolam 0.5 MG tablet  Commonly known as:  XANAX TAKE ONE TABLET BY MOUTH AT BEDTIME AS NEEDED FOR SLEEP/ANXIETY   Blood Glucose Monitoring Suppl Misc Glucose testing supplies-test once a day   CINNAMON PO Take 2,000 mg by mouth daily. 2,000 mg   gemfibrozil 600 MG tablet Commonly known as:   LOPID Take 1 tablet (600 mg total) by mouth daily.   glipiZIDE 2.5 MG 24 hr tablet Commonly known as:  GLIPIZIDE XL Take 2 tablets (5 mg total) by mouth daily with breakfast. What changed:  how much to take   glipiZIDE 5 MG tablet Commonly known as:  GLUCOTROL TAKE ONE EVERY EVENING WITH SUPPER What changed:  You were already taking a medication with the same name, and this prescription was added. Make sure you understand how and when to take each.   hyoscyamine 0.375 MG 12 hr tablet Commonly known as:  LEVBID Take 1 tablet (0.375 mg total) by mouth every 12 (twelve) hours as needed.   metFORMIN 500 MG 24 hr tablet Commonly known as:  GLUCOPHAGE-XR TAKE 1 TABLET BY MOUTH EVERY DAY WITH BREAKFAST   OMEGA-3 FISH OIL PO Take by mouth.   ONE TOUCH ULTRA TEST test strip Generic drug:  glucose blood TEST ONCE DAILY AS DIRECTED   oxyCODONE 5 MG immediate release tablet Commonly known as:  Oxy IR/ROXICODONE Take 1-2 tablets (5-10 mg total) by mouth every 4 (four) hours as needed for moderate pain.   pantoprazole 40 MG tablet Commonly known as:  PROTONIX Take 1 tablet (40 mg total) by mouth 2 (two) times daily.   PROBIOTIC FORMULA PO Take 1 capsule by mouth daily. PHILLIPS      Follow-up Information    Kinsinger, De Blanch, MD. Schedule an appointment as soon as possible for a visit in 2 week(s).   Specialty:  General Surgery Contact information: [address] [place] [address] [phone]        Sherrilyn Rist, MD Follow up.   Specialty:  Gastroenterology Why:  his office will contact you with future appointments.  feel free to call if questions or concerns.  ultimately Dr [name] will need to remove the plastic stent from your bile duct.  Contact information: [address] [place] [address] [phone]        Babs Sciara, MD. Schedule an appointment as soon as possible for a visit in 2 week(s).   Specialty:  Family  Medicine Contact information: [address] [place] [address] [phone]          Allergies  Allergen Reactions  . Eluxadoline Other (See Comments)    Low blood sugar Low blood sugar  . Azithromycin     Abdominal pain  . Cefzil [Cefprozil]     Abdominal pain  . Lisinopril     Cough has a side effect    Consultations:  General surgery  Gastroenterology  Procedures/Studies: Ct Abdomen Pelvis Wo Contrast  Result Date: 02/06/2018 CLINICAL DATA:  Jaundice for 3 days. EXAM: CT ABDOMEN AND PELVIS WITHOUT CONTRAST TECHNIQUE: Multidetector CT imaging of the abdomen and pelvis was performed following the standard protocol without IV contrast. COMPARISON:  CT abdomen and pelvis 08/10/2016. FINDINGS: Lower chest: Lung bases are clear. No pleural or pericardial effusion. Heart size is normal. Hepatobiliary: The liver is diffusely low attenuating. The liver border appears smooth. No hypertrophy of the caudate lobe or atrophy of the remainder of the liver is identified. The gallbladder is distended  with stones and sludge present. The gallbladder wall appears thickened at 0.6 cm and there may be a small amount of pericholecystic fluid. A stone is seen in the neck of the gallbladder. Biliary tree is unremarkable. Pancreas: Unremarkable. No pancreatic ductal dilatation or surrounding inflammatory changes. Spleen: Normal in size without focal abnormality. Adrenals/Urinary Tract: Adrenal glands are unremarkable. Kidneys are normal, without renal calculi, focal lesion, or hydronephrosis. Bladder is unremarkable. Stomach/Bowel: Stomach is within normal limits. Appendix appears normal. No evidence of bowel wall thickening, distention, or inflammatory changes. Vascular/Lymphatic: No significant vascular findings are present. No enlarged abdominal or pelvic lymph nodes. Reproductive: Prostate is unremarkable. Other: No ascites.  No hernia. Musculoskeletal: No acute or focal abnormality.  Degenerative disc disease lower lumbar spine noted. IMPRESSION: Gallstones, sludge and gallbladder wall thickening worrisome for cholecystitis. Right upper quadrant ultrasound is recommended for further evaluation. Diffuse fatty infiltration of the liver. Electronically Signed   By: Drusilla Kanner M.D.   On: 02/06/2018 14:59   Dg Chest 2 View  Result Date: 02/06/2018 CLINICAL DATA:  Weakness and jaundice EXAM: CHEST - 2 VIEW COMPARISON:  None. FINDINGS: The heart size and mediastinal contours are within normal limits. Both lungs are clear. The visualized skeletal structures are unremarkable. IMPRESSION: No active cardiopulmonary disease. Electronically Signed   By: Alcide Clever M.D.   On: 02/06/2018 14:20   Dg Cholangiogram Operative  Result Date: 02/09/2018 CLINICAL DATA:  Intraoperative cholangiogram during laparoscopic cholecystectomy. EXAM: INTRAOPERATIVE CHOLANGIOGRAM FLUOROSCOPY TIME:  21 seconds COMPARISON:  ERCP-02/08/2018; CT abdomen pelvis-02/06/2018 FINDINGS: Intraoperative cholangiographic images of the right upper abdominal quadrant during laparoscopic cholecystectomy are provided for review. Surgical clips overlie the expected location of the gallbladder fossa. Contrast injection demonstrates selective cannulation of the central aspect of the cystic duct. There is passage of contrast through the central aspect of the cystic duct with filling of a non dilated common bile duct. There is a persistent occlusive lenticular filling defect within the CBD worrisome for occlusive choledocholithiasis. There is no definitive passage of contrast beyond the mid aspect of the common bile duct. There is no opacification of the duodenum. There is minimal opacification of the central aspect of the intrahepatic biliary system which appears mildly dilated. IMPRESSION: Findings worrisome for occlusive choledocholithiasis. Further evaluation management with repeat ERCP could be performed as indicated.  Electronically Signed   By: Simonne Come M.D.   On: 02/09/2018 11:43   Dg Ercp Biliary & Pancreatic Ducts  Result Date: 02/11/2018 CLINICAL DATA:  52 year old male undergoing ERCP for choledocholithiasis EXAM: ERCP TECHNIQUE: Multiple spot images obtained with the fluoroscopic device and submitted for interpretation post-procedure. FLUOROSCOPY TIME:  Fluoroscopy Time:  7 minutes 5 seconds COMPARISON:  Intraoperative cholangiogram 02/09/2018 FINDINGS: A total of 29 intraoperative saved images were obtained and submitted for review. The images demonstrate a flexible endoscope in the descending duodenum with deep wire cannulation of the left intrahepatic ducts. Cholangiogram is performed showing mild intra and extrahepatic biliary ductal dilatation. There appears to be a filling defect at the junction of the cystic duct and common bile duct subsequent images demonstrate sphincterotomy and balloon sweep of the common duct. Final image demonstrates placement of a plastic biliary stent. The filling defect remains present within the cystic duct. IMPRESSION: The rounded filling defect seen in the common bile duct on the prior intraoperative cholangiogram is now located within the cystic duct remanent. Placement of a plastic biliary stent. These images were submitted for radiologic interpretation only. Please see the procedural report for  the amount of contrast and the fluoroscopy time utilized. Electronically Signed   By: Malachy Moan M.D.   On: 02/11/2018 13:22   Dg Ercp Biliary & Pancreatic Ducts  Result Date: 02/08/2018 CLINICAL DATA:  ERCP.  Concern for choledocholithiasis. EXAM: ERCP TECHNIQUE: Multiple spot images obtained with the fluoroscopic device and submitted for interpretation post-procedure. COMPARISON:  CT abdomen pelvis-02/06/2018; right upper quadrant abdominal ultrasound-02/07/2018 FLUOROSCOPY TIME:  5 minutes, 22 seconds FINDINGS: Sixteen spot intraoperative fluoroscopic images of the right  upper abdominal quadrant during ERCP are provided for review Initial image demonstrates an ERCP probe overlying the right upper abdominal quadrant. There is selective cannulation and faint opacification of the common bile duct. There is faint opacification of the cystic duct which contains several nonocclusive filling defects suggestive of choledocholithiasis. There are no discrete persistent filling defects with opacified portion of the common bile duct. There is minimal opacification intrahepatic biliary system which appears mildly dilated. IMPRESSION: ERCP with presumed stones within the cystic duct but not definitively within the common bile duct as detailed above. These images were submitted for radiologic interpretation only. Please see the procedural report for the amount of contrast and the fluoroscopy time utilized. Electronically Signed   By: Simonne Come M.D.   On: 02/08/2018 14:59   US Abdomen Limited Ruq  Result Date: 02/07/2018 CLINICAL DATA:  Hepatic cirrhosis by history. Now with elevated liver function studies, jaundice. Patient found have fatty liver changes on CT scan yesterday as well as gallstones and gallbladder wall thickening. EXAM: ULTRASOUND ABDOMEN LIMITED RIGHT UPPER QUADRANT COMPARISON:  Noncontrast abdominal and pelvic CT scan of February 06, 2018. FINDINGS: Gallbladder: The gallbladder is mildly distended. There are multiple echogenic mobile shadowing stones measuring up to 1.5 cm in diameter. There is mild gallbladder wall thickening to 4 mm. There is no positive sonographic Murphy's sign. Common bile duct: Diameter: 7 mm; no intraluminal sludge or stone is observed. Liver: The hepatic echotexture is increased. There is intrahepatic ductal dilation. The surface contour of the liver is fairly smooth. Portal vein is patent on color Doppler imaging with normal direction of blood flow towards the liver. IMPRESSION: Multiple gallstones with mild gallbladder wall thickening and gallbladder  distention without positive sonographic Murphy's sign. This likely reflects a subacute or chronic inflammation. There is also intrahepatic and common bile ductal dilation. The possibility of a distal common bile duct stone or distal common bile duct obstructive process is raised. MRCP now would be a useful next imaging step. Fatty infiltrative changes of the liver. No definite cirrhotic changes by ultrasound. Electronically Signed   By: David  [place] M.D.   On: 02/07/2018 14:24     Subjective: Eager to go home today  Discharge Exam: Vitals:   02/11/18 2220 02/12/18 0527  BP: 103/75 104/71  Pulse: 64 61  Resp: 16 17  Temp: 98.7 F (37.1 C) 98.1 F (36.7 C)  SpO2: 99% 100%   Vitals:   02/11/18 1315 02/11/18 1841 02/11/18 2220 02/12/18 0527  BP: (!) 148/84 122/76 103/75 104/71  Pulse: 76 67 64 61  Resp: 16 16 16 17   Temp: 98.4 F (36.9 C) 98.6 F (37 C) 98.7 F (37.1 C) 98.1 F (36.7 C)  TempSrc: Oral Oral Oral Oral  SpO2: 97% 99% 99% 100%  Weight:      Height:        General: Pt is alert, awake, not in acute distress Cardiovascular: RRR, S1/S2 +, no rubs, no gallops Respiratory: CTA bilaterally, no wheezing, no  rhonchi Abdominal: Soft, NT, ND, bowel sounds + Extremities: no edema, no cyanosis   The results of significant diagnostics from this hospitalization (including imaging, microbiology, ancillary and laboratory) are listed below for reference.     Microbiology: Recent Results (from the past 240 hour(s))  Urine culture     Status: None   Collection Time: 02/06/18  1:55 PM  Result Value Ref Range Status   Specimen Description   Final    URINE, CLEAN CATCH Performed at [facility], [address] [place], [place] [address]    Special Requests   Final    NONE Performed at [facility], [address] [place], [place] [address]    Culture   Final    NO GROWTH Performed at [facility], [address] [place], [place] [address]    Report Status  02/08/2018 FINAL  Final  Surgical pcr screen     Status: None   Collection Time: 02/06/18 10:05 PM  Result Value Ref Range Status   MRSA, PCR NEGATIVE NEGATIVE Final   Staphylococcus aureus NEGATIVE NEGATIVE Final    Comment: (NOTE) The Xpert SA Assay (FDA approved for NASAL specimens in patients 51 years of age and older), is one component of a comprehensive surveillance program. It is not intended to diagnose infection nor to guide or monitor treatment. Performed at [facility], [address] [place], [place] [address]      Labs: BNP (last 3 results) No results for input(s): BNP in the last 8760 hours. Basic Metabolic Panel: Recent Labs  Lab 02/08/18 0544 02/09/18 0801 02/10/18 0729 02/11/18 0915 02/12/18 0454  NA 135 133* 135 134* 136  K 4.4 4.9 4.5 4.8 4.7  CL 106 102 102 100* 103  CO2 20* 21* 25 25 27   GLUCOSE 128* 141* 147* 164* 138*  BUN 17 22* 13 16 16   CREATININE 1.34* 1.37* 1.24 1.29* 1.32*  CALCIUM 9.1 9.2 9.2 9.5 9.1   Liver Function Tests: Recent Labs  Lab 02/08/18 0544 02/09/18 0801 02/10/18 0729 02/11/18 0915 02/12/18 0454  AST 123* 116* 161* 135* 108*  ALT 223* 214* 267* 246* 198*  ALKPHOS 183* 174* 166* 172* 170*  BILITOT 15.4* 16.6* 18.8* 18.9* 15.9*  PROT 7.0 7.4 7.0 7.3 6.4*  ALBUMIN 3.1* 3.2* 3.1* 3.1* 2.6*   Recent Labs  Lab 02/06/18 1347  LIPASE 44   No results for input(s): AMMONIA in the last 168 hours. CBC: Recent Labs  Lab 02/06/18 1347 02/09/18 0801 02/10/18 0729  WBC 8.2 9.2 8.9  NEUTROABS 5.8 7.0  --   HGB 13.9 12.0* 11.7*  HCT 40.3 35.4* 34.4*  MCV 89.8 88.3 87.5  PLT 262 290 293   Cardiac Enzymes: Recent Labs  Lab 02/06/18 1347  TROPONINI <0.03   BNP: Invalid input(s): POCBNP CBG: Recent Labs  Lab 02/11/18 0728 02/11/18 1320 02/11/18 1751 02/11/18 2118 02/12/18 0806  GLUCAP 140* 103* 133* 142* 141*   D-Dimer No results for input(s): DDIMER in the last 72 hours. Hgb A1c No results for input(s):  HGBA1C in the last 72 hours. Lipid Profile No results for input(s): CHOL, HDL, LDLCALC, TRIG, CHOLHDL, LDLDIRECT in the last 72 hours. Thyroid function studies No results for input(s): TSH, T4TOTAL, T3FREE, THYROIDAB in the last 72 hours.  Invalid input(s): FREET3 Anemia work up No results for input(s): VITAMINB12, FOLATE, FERRITIN, TIBC, IRON, RETICCTPCT in the last 72 hours. Urinalysis    Component Value Date/Time   COLORURINE AMBER (A) 02/06/2018 1355  APPEARANCEUR HAZY (A) 02/06/2018 1355   LABSPEC 1.019 02/06/2018 1355   PHURINE 5.0 02/06/2018 1355   GLUCOSEU NEGATIVE 02/06/2018 1355   HGBUR NEGATIVE 02/06/2018 1355   BILIRUBINUR MODERATE (A) 02/06/2018 1355   KETONESUR NEGATIVE 02/06/2018 1355   PROTEINUR 30 (A) 02/06/2018 1355   NITRITE NEGATIVE 02/06/2018 1355   LEUKOCYTESUR NEGATIVE 02/06/2018 1355   Sepsis Labs Invalid input(s): PROCALCITONIN,  WBC,  LACTICIDVEN Microbiology Recent Results (from the past 240 hour(s))  Urine culture     Status: None   Collection Time: 02/06/18  1:55 PM  Result Value Ref Range Status   Specimen Description   Final    URINE, CLEAN CATCH Performed at [facility], [address], [place], [place] [address]    Special Requests   Final    NONE Performed at [facility], [address], [place], [place] [address]    Culture   Final    NO GROWTH Performed at [facility], [address], [place], [place] [address]    Report Status 02/08/2018 FINAL  Final  Surgical pcr screen     Status: None   Collection Time: 02/06/18 10:05 PM  Result Value Ref Range Status   MRSA, PCR NEGATIVE NEGATIVE Final   Staphylococcus aureus NEGATIVE NEGATIVE Final    Comment: (NOTE) The Xpert SA Assay (FDA approved for NASAL specimens in patients 44 years of age and older), is one component of a comprehensive surveillance program. It is not intended to diagnose infection nor to guide or monitor treatment. Performed at [facility], [address] [place], [place] [address]    Time spent: 30 minutes  SIGNED:   Rickey Barbara, MD  [facility] 02/12/2018, 9:13 AM  If 7PM-7AM, please contact night-coverage [email or web address] Password [email or web address]

## 2018-02-13 ENCOUNTER — Encounter (HOSPITAL_COMMUNITY): Payer: Self-pay | Admitting: Gastroenterology

## 2018-02-14 ENCOUNTER — Telehealth: Payer: Self-pay | Admitting: Family Medicine

## 2018-02-14 ENCOUNTER — Other Ambulatory Visit: Payer: Self-pay | Admitting: *Deleted

## 2018-02-14 DIAGNOSIS — K8020 Calculus of gallbladder without cholecystitis without obstruction: Secondary | ICD-10-CM

## 2018-02-14 DIAGNOSIS — R748 Abnormal levels of other serum enzymes: Secondary | ICD-10-CM

## 2018-02-14 DIAGNOSIS — N289 Disorder of kidney and ureter, unspecified: Secondary | ICD-10-CM

## 2018-02-14 NOTE — Telephone Encounter (Signed)
Discussed with pt. Pt states no issues at this time. He scheduled appt for Friday for hospital follow up. Orders were put in for bw. Pt notified to do them 2 days before appt. He states dr Myrtie Neitheranis from [facility] was going to get in touch with you about bloodwork he wanted done also. Told pt that dr Lorin Picketscott reviewed notes and ordered bw but he still wanted to make sure the bw dr Myrtie Neitherdanis wanted was added. Pt aware dr Lorin Picketscott is out of office til Wednesday. He states he will do bw Wednesday after hearing back from office.

## 2018-02-14 NOTE — Anesthesia Postprocedure Evaluation (Signed)
Anesthesia Post Note  Patient: Gregory BalesStephen D Thompson  Procedure(s) Performed: ENDOSCOPIC RETROGRADE CHOLANGIOPANCREATOGRAPHY (ERCP) WITH PROPOFOL (N/A ) SPHINCTEROTOMY BILIARY STENT PLACEMENT REMOVAL OF STONES     Patient location during evaluation: PACU Anesthesia Type: General Level of consciousness: awake and alert Pain management: pain level controlled Vital Signs Assessment: post-procedure vital signs reviewed and stable Respiratory status: spontaneous breathing, nonlabored ventilation and respiratory function stable Cardiovascular status: blood pressure returned to baseline and stable Postop Assessment: no apparent nausea or vomiting Anesthetic complications: no    Last Vitals:  Vitals:   02/12/18 0527 02/12/18 1000  BP: 104/71 111/78  Pulse: 61 65  Resp: 17 16  Temp: 36.7 C 36.9 C  SpO2: 100% 99%    Last Pain:  Vitals:   02/12/18 1000  TempSrc: Oral  PainSc:                  Lowella CurbWarren Ray [place] Daywalt

## 2018-02-14 NOTE — Telephone Encounter (Signed)
Nurse's-patient recently discharged from the hospital. Please call patient, let them know that we are aware that they were discharged from the hospital. Please schedule them to follow-up with [place] within the next 7 days. Advised the patient to bring all of their medications with him to the visit. Please inquire if they are having any acute issues currently and documented accordingly.   nurses-please set up patient-CBC and met 7 and-liver profile and lipase  reason elevated liver enzymes renal insufficiency gallstones.  Please have patient do these labs 1 to 2 days before his office visit.  Follow-up office visit next week-patient just got out of the hospital with gallstone, gallbladder surgery, blocked t bile duct

## 2018-02-15 NOTE — Telephone Encounter (Signed)
Reassure the patient that I reviewed over the notes from gastroenterology.  Please have the patient do the lab work.

## 2018-02-16 ENCOUNTER — Telehealth: Payer: Self-pay

## 2018-02-16 NOTE — Telephone Encounter (Signed)
Patient called back states he has neck and back pain. Wants to know what he can take as he has elevated liver enzymes and was told not to take tylenol.He states he has IBS and is unable to take Ibuprofen.Pls advise.

## 2018-02-16 NOTE — Telephone Encounter (Signed)
For now unfortunately there is not any medication he can take.  When he does his follow-up visit we can discuss other options but even these other options come with potential drawbacks-for now I recommend massage warm compresses

## 2018-02-16 NOTE — Telephone Encounter (Signed)
Discussed with pt. Pt verbalized understanding.  °

## 2018-02-16 NOTE — Telephone Encounter (Signed)
Patient is aware 

## 2018-02-17 LAB — BASIC METABOLIC PANEL
BUN / CREAT RATIO: 13 (ref 9–20)
BUN: 15 mg/dL (ref 6–24)
CHLORIDE: 99 mmol/L (ref 96–106)
CO2: 22 mmol/L (ref 20–29)
Calcium: 9.9 mg/dL (ref 8.7–10.2)
Creatinine, Ser: 1.18 mg/dL (ref 0.76–1.27)
GFR calc Af Amer: 83 mL/min/{1.73_m2} (ref >59)
GFR calc non Af Amer: 72 mL/min/{1.73_m2} (ref >59)
GLUCOSE: 154 mg/dL — AB (ref 65–99)
Potassium: 4.4 mmol/L (ref 3.5–5.2)
SODIUM: 135 mmol/L (ref 134–144)

## 2018-02-17 LAB — CBC WITH DIFFERENTIAL/PLATELET
BASOS ABS: 0.1 10*3/uL (ref 0.0–0.2)
Basos: 1 %
EOS (ABSOLUTE): 0.4 10*3/uL (ref 0.0–0.4)
Eos: 4 %
Hematocrit: 34.3 % — ABNORMAL LOW (ref 37.5–51.0)
Hemoglobin: 11.3 g/dL — ABNORMAL LOW (ref 13.0–17.7)
Immature Grans (Abs): 0.1 10*3/uL (ref 0.0–0.1)
Immature Granulocytes: 1 %
LYMPHS ABS: 1.8 10*3/uL (ref 0.7–3.1)
Lymphs: 19 %
MCH: 29.7 pg (ref 26.6–33.0)
MCHC: 32.9 g/dL (ref 31.5–35.7)
MCV: 90 fL (ref 79–97)
MONOS ABS: 0.7 10*3/uL (ref 0.1–0.9)
Monocytes: 8 %
Neutrophils Absolute: 6.5 10*3/uL (ref 1.4–7.0)
Neutrophils: 67 %
Platelets: 580 10*3/uL — ABNORMAL HIGH (ref 150–450)
RBC: 3.81 x10E6/uL — AB (ref 4.14–5.80)
RDW: 14.8 % (ref 12.3–15.4)
WBC: 9.5 10*3/uL (ref 3.4–10.8)

## 2018-02-17 LAB — HEPATIC FUNCTION PANEL
ALK PHOS: 285 IU/L — AB (ref 39–117)
ALT: 209 IU/L — AB (ref 0–44)
AST: 113 IU/L — AB (ref 0–40)
Albumin: 3.9 g/dL (ref 3.5–5.5)
BILIRUBIN, DIRECT: 7.73 mg/dL — AB (ref 0.00–0.40)
Bilirubin Total: 10.5 mg/dL — ABNORMAL HIGH (ref 0.0–1.2)
Total Protein: 7.2 g/dL (ref 6.0–8.5)

## 2018-02-17 LAB — LIPASE: LIPASE: 77 U/L (ref 13–78)

## 2018-02-18 ENCOUNTER — Ambulatory Visit (INDEPENDENT_AMBULATORY_CARE_PROVIDER_SITE_OTHER): Payer: 59 | Admitting: Family Medicine

## 2018-02-18 ENCOUNTER — Encounter: Payer: Self-pay | Admitting: Family Medicine

## 2018-02-18 VITALS — BP 120/80 | Ht 68.0 in | Wt 168.2 lb

## 2018-02-18 DIAGNOSIS — R7989 Other specified abnormal findings of blood chemistry: Secondary | ICD-10-CM

## 2018-02-18 DIAGNOSIS — R945 Abnormal results of liver function studies: Secondary | ICD-10-CM

## 2018-02-18 DIAGNOSIS — R17 Unspecified jaundice: Secondary | ICD-10-CM | POA: Diagnosis not present

## 2018-02-18 NOTE — Progress Notes (Signed)
Subjective:    Patient ID: Gregory Thompson, male    DOB: 07/29/1966, 52 y.o.   MRN: [id number]  HPI Pt is here for hospital follow up. Pt was in hospital from 02/06/18-02/12/18. Pt was hospitalized for jaundice, dehydration, and gall bladder. Pt had gall bladder removed. Pt states he is feeling OK but still a little "fuzzy headed" and a little weak.  This patient was recently released from the hospital.  Before he went into the hospital wasLiver function minimally elevated.  Bilirubin look good  Patient started having some back pain nausea vomiting called wanted to be seen after hours late on a Friday that was not available he was given the opportunity to come in but unable to do so now ended up going to the ER for further evaluation had significant jaundice at that time was admitted and was found to have a gallstone they remove the gallbladder but he has a gallstone within the hepatic duct   this was removed by surgery but the stone is still in the duct has a stent will have to go back for further surgery he is currently under the care of gastroenterology Dr. Amada Jupiter and under the care of general surgery Dr. Jeanella Flattery  Patient unable to work currently.  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  Having some intermittent abdominal pain intermittent nausea not using pain medication  Patient did quit smoking which is a blessing Review of Systems  Constitutional: Negative for activity change, fatigue and fever.  HENT: Negative for congestion and rhinorrhea.   Respiratory: Negative for cough and shortness of breath.   Cardiovascular: Negative for chest pain and leg swelling.  Gastrointestinal: Positive for abdominal pain. Negative for blood in stool, constipation, diarrhea and nausea.  Genitourinary: Negative for dysuria and hematuria.  Neurological: Negative for weakness and headaches.  Psychiatric/Behavioral: Negative for behavioral problems.       Objective:   Physical Exam  Constitutional: He appears well-nourished. No distress.  HENT:  Head: Normocephalic and atraumatic.  Eyes: Right eye exhibits no discharge. Left eye exhibits no discharge. Scleral icterus is present.  Neck: No tracheal deviation present.  Cardiovascular: Normal rate, regular rhythm and normal heart sounds.  No murmur heard. Pulmonary/Chest: Effort normal and breath sounds normal. No respiratory distress.  Abdominal: Soft. He exhibits no distension. There is no tenderness. There is no guarding.  Musculoskeletal: He exhibits no edema.  Lymphadenopathy:    He has no cervical adenopathy.  Neurological: He is alert.  Skin: Skin is warm and dry.  Psychiatric: His behavior is normal.  Vitals reviewed. Patient does have moderate jaundice no sign of infection around the site of his surgery        Assessment & Plan:  Significant fatigue tiredness related to the recent surgery.  Patient is not in condition to go back to work at this time Long discussion held with the patient I would like to see his bilirubin come down liver enzymes come down and his conditioning improved before going back to his physical job  I spoke with gastroenterology they did agree with repeating liver enzymes next week they state they will not necessarily see the patient until after he is seen the surgeon then they will decide when to see him and they stated that they will connect with the patient and follow-up with him  Recent lab work reviewed with the patient  Patient was told that should he start running fever severe vomiting or  severe pain immediately go to ER  Hyperlipidemia for now hold off on statin I do not feel this is causing his elevated liver enzymes but we do not want to muddy the water  IBS he will cut back on his dicyclomine to just 1/day  Bowel movements he is having one every few days but it is not firm I told him if it does get firm he can use MiraLAX half capful daily  We will fill  out disability forms for this patient  Diabetes decent control continue current measures  Blood pressure good without being on medication currently  Patient has quit smoking

## 2018-02-21 DIAGNOSIS — Z029 Encounter for administrative examinations, unspecified: Secondary | ICD-10-CM

## 2018-02-22 ENCOUNTER — Telehealth: Payer: Self-pay | Admitting: Family Medicine

## 2018-02-22 NOTE — Progress Notes (Signed)
Thanks for the note and for calling me last week.  I will coordinate follow up and eventual biliary stent removal after determining Dr. Mariah MillingKinsiger's plan for surgical attention to the remaining cystic duct stone.  Appreciate LFTs next week.Please copy results to me.

## 2018-02-22 NOTE — Telephone Encounter (Signed)
Last seen 02/18/2018

## 2018-02-22 NOTE — Telephone Encounter (Signed)
Patient brought in form at his visit for disability claim.After reviewing form this should be filled out by his specialist but all the questions are asking about his sickness, the procedure and when he is going back to work.I wasn't sure what to do with this form since we didn't see him for it.Form is in your yellow folder.

## 2018-02-24 ENCOUNTER — Telehealth: Payer: Self-pay | Admitting: Family Medicine

## 2018-02-24 LAB — HEPATIC FUNCTION PANEL
ALT: 136 IU/L — AB (ref 0–44)
AST: 89 IU/L — AB (ref 0–40)
Albumin: 4.3 g/dL (ref 3.5–5.5)
Alkaline Phosphatase: 237 IU/L — ABNORMAL HIGH (ref 39–117)
BILIRUBIN TOTAL: 5 mg/dL — AB (ref 0.0–1.2)
BILIRUBIN, DIRECT: 3.4 mg/dL — AB (ref 0.00–0.40)
Total Protein: 7.4 g/dL (ref 6.0–8.5)

## 2018-02-24 NOTE — Telephone Encounter (Signed)
These forms were filled out.  Estimated date of return to work would be March 19, 2018 but this is estimated and it depends on what the surgeon has to say I highly recommend that if the surgeon takes him out of work longer that they would be the one to fill in an extension of his work note.  Plus also his work note states that he has to have clinical notes to go with it.  I printed some clinical notes to go with this form the patient can sign a release

## 2018-02-24 NOTE — Telephone Encounter (Signed)
Form was completed, message was sent to the nurses regarding his lab work

## 2018-02-24 NOTE — Addendum Note (Signed)
Addended by: Meredith LeedsSUTTON, CRYSTAL L on: 02/24/2018 10:41 AM   Modules accepted: Orders

## 2018-02-24 NOTE — Telephone Encounter (Signed)
Patient is calling to check on his disability forms.  He is hoping to have these done ASAP because he isn't being paid.  Also, he wanted to let Dr. Lorin PicketScott know that he did his blood work yesterday.

## 2018-03-02 ENCOUNTER — Telehealth: Payer: Self-pay | Admitting: Family Medicine

## 2018-03-02 LAB — HEPATIC FUNCTION PANEL
ALBUMIN: 4 g/dL (ref 3.5–5.5)
ALK PHOS: 160 IU/L — AB (ref 39–117)
ALT: 89 IU/L — ABNORMAL HIGH (ref 0–44)
AST: 53 IU/L — ABNORMAL HIGH (ref 0–40)
Bilirubin Total: 2.8 mg/dL — ABNORMAL HIGH (ref 0.0–1.2)
Bilirubin, Direct: 2.05 mg/dL — ABNORMAL HIGH (ref 0.00–0.40)
TOTAL PROTEIN: 7 g/dL (ref 6.0–8.5)

## 2018-03-02 NOTE — Telephone Encounter (Signed)
Pt called back to ask if we could give him his lab results Wonders if his liver enzymes are coming down  Please advise

## 2018-03-02 NOTE — Telephone Encounter (Signed)
Results discussed with patient. Patient advised  that his liver functions are improving Dr Nicki Reaper would recommend repeating it again in 1 week.- Patient verbalized understanding and stated he met with surgeon today and he rec no further surgery at this time and referring back to GI-follow up with surgeon in 2 months.

## 2018-03-02 NOTE — Telephone Encounter (Signed)
Patient just left the surgeon and was told that he wants to see him back in 2 months.  They are not going to do another surgery right now. Him and the GI doctor have to confer.  Also, he agreed with 2 more weeks out of work and return if everything is good beyond that.

## 2018-03-03 NOTE — Telephone Encounter (Signed)
We will see him at his follow-up in July 15

## 2018-03-08 ENCOUNTER — Other Ambulatory Visit: Payer: Self-pay | Admitting: Family Medicine

## 2018-03-08 DIAGNOSIS — R748 Abnormal levels of other serum enzymes: Secondary | ICD-10-CM

## 2018-03-09 ENCOUNTER — Other Ambulatory Visit: Payer: Self-pay | Admitting: Family Medicine

## 2018-03-09 DIAGNOSIS — R7989 Other specified abnormal findings of blood chemistry: Secondary | ICD-10-CM

## 2018-03-09 DIAGNOSIS — R945 Abnormal results of liver function studies: Principal | ICD-10-CM

## 2018-03-09 LAB — HEPATIC FUNCTION PANEL
ALK PHOS: 109 IU/L (ref 39–117)
ALT: 71 IU/L — ABNORMAL HIGH (ref 0–44)
AST: 52 IU/L — ABNORMAL HIGH (ref 0–40)
Albumin: 4.6 g/dL (ref 3.5–5.5)
BILIRUBIN TOTAL: 2.3 mg/dL — AB (ref 0.0–1.2)
BILIRUBIN, DIRECT: 1.48 mg/dL — AB (ref 0.00–0.40)
Total Protein: 7.9 g/dL (ref 6.0–8.5)

## 2018-03-15 ENCOUNTER — Telehealth: Payer: Self-pay

## 2018-03-15 ENCOUNTER — Telehealth: Payer: Self-pay | Admitting: Family Medicine

## 2018-03-15 NOTE — Telephone Encounter (Signed)
Faxed referral information to Dr. Shana ChuteBurbridge at [phone].

## 2018-03-15 NOTE — Telephone Encounter (Signed)
-----   Message from Charlie PitterHenry L Danis III, MD sent at 03/15/2018 10:52 AM EDT ----- Yes, I did refer him [facility].  Forgot to tell you - meant to copy you on the email.  Please send my hospital consult, ERCP report x 2, ERCP Xray report x 2, Operative report from cholecystectomy, and last 6 weeks worth of LFTs.   Thanks.  - HD ----- Message ----- From: Leverne HumblesFournier, Macey Wurtz M, RN Sent: 03/15/2018  10:41 AM To: Sherrilyn RistHenry L Danis III, MD  Dr. Myrtie Neitheranis, Did you refer patient to [facility]? They are calling requesting records. Are you waiting on consult to schedule patient here in office?  Thanks, Raynelle FanningJulie

## 2018-03-15 NOTE — Telephone Encounter (Signed)
Pt calling to make Dr. Lorin PicketScott aware that he is having his final gall[facility]

## 2018-03-17 LAB — HEPATIC FUNCTION PANEL
ALK PHOS: 83 IU/L (ref 39–117)
ALT: 79 IU/L — AB (ref 0–44)
AST: 51 IU/L — ABNORMAL HIGH (ref 0–40)
Albumin: 4.4 g/dL (ref 3.5–5.5)
Bilirubin Total: 1.4 mg/dL — ABNORMAL HIGH (ref 0.0–1.2)
Bilirubin, Direct: 0.85 mg/dL — ABNORMAL HIGH (ref 0.00–0.40)
TOTAL PROTEIN: 7.4 g/dL (ref 6.0–8.5)

## 2018-03-20 NOTE — Telephone Encounter (Signed)
Please see lab note thank you

## 2018-03-21 ENCOUNTER — Encounter: Payer: Self-pay | Admitting: Family Medicine

## 2018-03-21 ENCOUNTER — Ambulatory Visit (INDEPENDENT_AMBULATORY_CARE_PROVIDER_SITE_OTHER): Payer: 59 | Admitting: Family Medicine

## 2018-03-21 VITALS — BP 132/80 | Ht 68.0 in | Wt 175.0 lb

## 2018-03-21 DIAGNOSIS — R945 Abnormal results of liver function studies: Secondary | ICD-10-CM

## 2018-03-21 DIAGNOSIS — R7989 Other specified abnormal findings of blood chemistry: Secondary | ICD-10-CM

## 2018-03-21 NOTE — Progress Notes (Signed)
   Subjective:    Patient ID: Gregory BalesStephen D Madera, male    DOB: 04/02/1966, 52 y.o.   MRN: [id number]  HPIFollow up test results. [facility]  This patient is going to be having a procedure for which they will do a ERCP with probable lithotripsy or possible surgery.  Hopefully they will be successful his liver enzymes recently elevated but do not need to be repeated this week denies high fever chills sweats does relate some right upper quadrant discomfort He does relate significant fatigue tiredness he works a very strenuous job with his current condition he is unable to work  Review of Systems  Constitutional: Negative for activity change, appetite change and fatigue.  HENT: Negative for congestion and rhinorrhea.   Respiratory: Negative for cough, chest tightness and shortness of breath.   Cardiovascular: Negative for chest pain and leg swelling.  Gastrointestinal: Positive for abdominal pain. Negative for blood in stool, diarrhea and nausea.  Endocrine: Negative for polydipsia and polyphagia.  Genitourinary: Negative for dysuria and hematuria.  Neurological: Negative for weakness and headaches.  Psychiatric/Behavioral: Negative for confusion and dysphoric mood.   Please see above    Objective:   Physical Exam  Constitutional: He appears well-nourished. No distress.  Cardiovascular: Normal rate, regular rhythm and normal heart sounds.  No murmur heard. Pulmonary/Chest: Effort normal and breath sounds normal. No respiratory distress.  Abdominal: Soft. There is tenderness.  Musculoskeletal: He exhibits no edema.  Lymphadenopathy:    He has no cervical adenopathy.  Neurological: He is alert.  Psychiatric: His behavior is normal.  Vitals reviewed.   He does have some slight tenderness right upper quadrant he is not jaundiced recent lab work reviewed with the patient  The family will give us an update this Friday on how the procedure went and  whether or not we need to do follow-up testing    Assessment & Plan:  Persistent elevated liver enzymes Probably related to the stone in his common bile duct Getting surgical procedure at [facility] Hopefully this will be successful More than likely patient will be out of work all next week recovering from this and gaining strength back to get him ready to go back to work on 29 July  15 minutes was spent with patient today discussing healthcare issues which they came.  More than 50% of this visit-total duration of visit-was spent in counseling and coordination of care.  Please see diagnosis regarding the focus of this coordination and care

## 2018-03-21 NOTE — Telephone Encounter (Signed)
Discussed with pt

## 2018-03-25 ENCOUNTER — Telehealth: Payer: Self-pay | Admitting: Family Medicine

## 2018-03-25 DIAGNOSIS — R7989 Other specified abnormal findings of blood chemistry: Secondary | ICD-10-CM

## 2018-03-25 DIAGNOSIS — R945 Abnormal results of liver function studies: Principal | ICD-10-CM

## 2018-03-25 NOTE — Telephone Encounter (Signed)
I believe it would be in his best interest to repeat his liver enzyme panel in approximately 2 to 3 weeks

## 2018-03-25 NOTE — Telephone Encounter (Signed)
Patient is aware and orders placed.

## 2018-03-25 NOTE — Telephone Encounter (Signed)
Patient called to let you know that surgery went well and no follow up at this time [facility]. He has been released to return to work on 7/29.

## 2018-03-26 ENCOUNTER — Other Ambulatory Visit: Payer: Self-pay | Admitting: Family Medicine

## 2018-04-25 ENCOUNTER — Telehealth: Payer: Self-pay | Admitting: Family Medicine

## 2018-04-25 DIAGNOSIS — E119 Type 2 diabetes mellitus without complications: Secondary | ICD-10-CM

## 2018-04-25 NOTE — Telephone Encounter (Signed)
Blood work ordered in Epic. 

## 2018-04-25 NOTE — Telephone Encounter (Signed)
Pt went to [facility] to have lab work done. He said he was suppose to have A1C checked as well. There is no order for that lab. The lab went ahead and drew for the A1C they are just needing an order put in.

## 2018-04-25 NOTE — Telephone Encounter (Signed)
Please put in order for A1c for type 2 diabetes please communicate this to [facility] so they go ahead and run the test thanks

## 2018-04-26 LAB — HEPATIC FUNCTION PANEL
ALBUMIN: 4.9 g/dL (ref 3.5–5.5)
ALT: 57 IU/L — ABNORMAL HIGH (ref 0–44)
AST: 33 IU/L (ref 0–40)
Alkaline Phosphatase: 57 IU/L (ref 39–117)
Bilirubin Total: 0.4 mg/dL (ref 0.0–1.2)
Bilirubin, Direct: 0.17 mg/dL (ref 0.00–0.40)
TOTAL PROTEIN: 8.3 g/dL (ref 6.0–8.5)

## 2018-04-26 LAB — HEMOGLOBIN A1C
Est. average glucose Bld gHb Est-mCnc: 114 mg/dL
HEMOGLOBIN A1C: 5.6 % (ref 4.8–5.6)

## 2018-05-21 LAB — HM DIABETES EYE EXAM

## 2018-06-21 ENCOUNTER — Telehealth: Payer: Self-pay | Admitting: Family Medicine

## 2018-06-21 DIAGNOSIS — D649 Anemia, unspecified: Secondary | ICD-10-CM

## 2018-06-21 DIAGNOSIS — Z79899 Other long term (current) drug therapy: Secondary | ICD-10-CM

## 2018-06-21 DIAGNOSIS — E785 Hyperlipidemia, unspecified: Secondary | ICD-10-CM

## 2018-06-21 NOTE — Telephone Encounter (Signed)
Lipid, hepatic panel, CBC-hyperlipidemia, elevated liver enzymes, anemia

## 2018-06-21 NOTE — Telephone Encounter (Signed)
Pt has appt for follow up with Dr. Lorin Picket on 07/08/18. Would Dr. Lorin Picket like pt to have lab work done before appt?

## 2018-06-21 NOTE — Telephone Encounter (Signed)
Last labs are A1c and hepatic fx panel on 04/25/2018.

## 2018-06-21 NOTE — Telephone Encounter (Signed)
Patient is aware labs are ordered

## 2018-06-21 NOTE — Telephone Encounter (Signed)
Pt has follow up appt with Dr. Lorin Picket on 07/08/18. Would Dr. Lorin Picket like blood work done before that appt?

## 2018-07-08 ENCOUNTER — Encounter: Payer: Self-pay | Admitting: Family Medicine

## 2018-07-08 ENCOUNTER — Ambulatory Visit (INDEPENDENT_AMBULATORY_CARE_PROVIDER_SITE_OTHER): Payer: 59 | Admitting: Family Medicine

## 2018-07-08 VITALS — BP 132/84 | Ht 68.0 in | Wt 184.4 lb

## 2018-07-08 DIAGNOSIS — E785 Hyperlipidemia, unspecified: Secondary | ICD-10-CM

## 2018-07-08 DIAGNOSIS — R945 Abnormal results of liver function studies: Secondary | ICD-10-CM | POA: Diagnosis not present

## 2018-07-08 DIAGNOSIS — E119 Type 2 diabetes mellitus without complications: Secondary | ICD-10-CM | POA: Diagnosis not present

## 2018-07-08 DIAGNOSIS — E781 Pure hyperglyceridemia: Secondary | ICD-10-CM

## 2018-07-08 DIAGNOSIS — G4700 Insomnia, unspecified: Secondary | ICD-10-CM

## 2018-07-08 DIAGNOSIS — R7989 Other specified abnormal findings of blood chemistry: Secondary | ICD-10-CM

## 2018-07-08 DIAGNOSIS — K580 Irritable bowel syndrome with diarrhea: Secondary | ICD-10-CM

## 2018-07-08 DIAGNOSIS — Z23 Encounter for immunization: Secondary | ICD-10-CM

## 2018-07-08 LAB — LIPID PANEL
CHOL/HDL RATIO: 6.1 ratio — AB (ref 0.0–5.0)
Cholesterol, Total: 201 mg/dL — ABNORMAL HIGH (ref 100–199)
HDL: 33 mg/dL — ABNORMAL LOW (ref >39)
LDL CALC: 126 mg/dL — AB (ref 0–99)
Triglycerides: 209 mg/dL — ABNORMAL HIGH (ref 0–149)
VLDL CHOLESTEROL CAL: 42 mg/dL — AB (ref 5–40)

## 2018-07-08 LAB — HEPATIC FUNCTION PANEL
ALT: 56 IU/L — ABNORMAL HIGH (ref 0–44)
AST: 38 IU/L (ref 0–40)
Albumin: 4.9 g/dL (ref 3.5–5.5)
Alkaline Phosphatase: 50 IU/L (ref 39–117)
Bilirubin Total: 0.5 mg/dL (ref 0.0–1.2)
Bilirubin, Direct: 0.16 mg/dL (ref 0.00–0.40)
TOTAL PROTEIN: 7.8 g/dL (ref 6.0–8.5)

## 2018-07-08 LAB — CBC WITH DIFFERENTIAL/PLATELET
BASOS: 2 %
Basophils Absolute: 0.1 10*3/uL (ref 0.0–0.2)
EOS (ABSOLUTE): 0.7 10*3/uL — AB (ref 0.0–0.4)
Eos: 9 %
Hematocrit: 43.4 % (ref 37.5–51.0)
Hemoglobin: 14.5 g/dL (ref 13.0–17.7)
IMMATURE GRANS (ABS): 0 10*3/uL (ref 0.0–0.1)
Immature Granulocytes: 0 %
LYMPHS ABS: 1.8 10*3/uL (ref 0.7–3.1)
LYMPHS: 22 %
MCH: 28.6 pg (ref 26.6–33.0)
MCHC: 33.4 g/dL (ref 31.5–35.7)
MCV: 86 fL (ref 79–97)
MONOS ABS: 0.6 10*3/uL (ref 0.1–0.9)
Monocytes: 8 %
NEUTROS ABS: 4.7 10*3/uL (ref 1.4–7.0)
Neutrophils: 59 %
Platelets: 230 10*3/uL (ref 150–450)
RBC: 5.07 x10E6/uL (ref 4.14–5.80)
RDW: 13.6 % (ref 12.3–15.4)
WBC: 7.9 10*3/uL (ref 3.4–10.8)

## 2018-07-08 MED ORDER — GEMFIBROZIL 600 MG PO TABS: ORAL_TABLET | ORAL | 1 refills | Status: DC

## 2018-07-08 MED ORDER — PRAVASTATIN SODIUM 20 MG PO TABS: 20.0000 mg | ORAL_TABLET | Freq: Every day | ORAL | 3 refills | Status: DC

## 2018-07-08 MED ORDER — ALPRAZOLAM 0.5 MG PO TABS: ORAL_TABLET | ORAL | 5 refills | Status: DC

## 2018-07-08 NOTE — Progress Notes (Signed)
   Subjective:    Patient ID: Gregory Thompson, male    DOB: 1966/03/17, 52 y.o.   MRN: [id number]  HPI Pt here today for follow up on elevated liver enzymes.  Patient with elevated triglycerides and LDL watch his diet fairly well could do better states overall energy level doing pretty decent taking gemfibrozil not on statin currently lab work shows an increased risk of heart disease at least 11% risk in the next 10 years  Fatty liver liver enzymes do not look as bad watch his diet tries to stay active no longer smokes  Irritable bowel doing well with medication some diarrhea but not severe  Type 2 diabetes without complications but having occasional low sugar spells we talked about adjusting medicine watching diet  Insomnia issues uses Xanax at nighttime it helps him greatly  Gout no flareups recently tolerating the medicine  Review of Systems  Constitutional: Negative for diaphoresis and fatigue.  HENT: Negative for congestion and rhinorrhea.   Respiratory: Negative for cough and shortness of breath.   Cardiovascular: Negative for chest pain and leg swelling.  Gastrointestinal: Negative for abdominal pain and diarrhea.  Skin: Negative for color change and rash.  Neurological: Negative for dizziness and headaches.  Psychiatric/Behavioral: Negative for behavioral problems and confusion.       Objective:   Physical Exam  Constitutional: He appears well-nourished. No distress.  HENT:  Head: Normocephalic and atraumatic.  Eyes: Right eye exhibits no discharge. Left eye exhibits no discharge.  Neck: No tracheal deviation present.  Cardiovascular: Normal rate, regular rhythm and normal heart sounds.  No murmur heard. Pulmonary/Chest: Effort normal and breath sounds normal. No respiratory distress.  Musculoskeletal: He exhibits no edema.  Lymphadenopathy:    He has no cervical adenopathy.  Neurological: He is alert. Coordination normal.  Skin: Skin is warm and dry.    Psychiatric: He has a normal mood and affect. His behavior is normal.  Vitals reviewed.         Assessment & Plan:  Has a history of gout no gout flareups recently continue medication  Has insomnia and Xanax helps him greatly we will go ahead and refill the medication  Hypertriglyceridemia not as bad reduce the gemfibrozil down to every other day  Hyperlipidemia at increased risk for heart attack will restart pravastatin 20 mg daily and repeat lab work again in 10 to 12 weeks  Diabetes fair control with some low sugar spells minimize glipizide recommend 2.5 mg extended release 1/day  IBS stable currently on Levbid twice daily  Gastritis esophagitis doing well on medication continue this twice daily of Protonix  Colonoscopy recommended patient defers currently we will think about doing it by spring  Fatty liver elevated liver enzymes looks much improved  25 minutes was spent with the patient.  This statement verifies that 25 minutes was indeed spent with the patient.  More than 50% of this visit-total duration of the visit-was spent in counseling and coordination of care. The issues that the patient came in for today as reflected in the diagnosis (s) please refer to documentation for further details.

## 2018-07-19 ENCOUNTER — Ambulatory Visit (INDEPENDENT_AMBULATORY_CARE_PROVIDER_SITE_OTHER): Payer: 59 | Admitting: *Deleted

## 2018-07-19 DIAGNOSIS — Z23 Encounter for immunization: Secondary | ICD-10-CM | POA: Diagnosis not present

## 2018-07-26 ENCOUNTER — Other Ambulatory Visit: Payer: Self-pay | Admitting: Family Medicine

## 2018-07-26 NOTE — Telephone Encounter (Signed)
Contacted patient to see what mg he is taking due to med list saying 2.5 mg. Left message to return call

## 2018-07-27 ENCOUNTER — Ambulatory Visit (INDEPENDENT_AMBULATORY_CARE_PROVIDER_SITE_OTHER): Payer: 59 | Admitting: Family Medicine

## 2018-07-27 ENCOUNTER — Encounter: Payer: Self-pay | Admitting: Family Medicine

## 2018-07-27 VITALS — Temp 98.4°F | Ht 68.0 in | Wt 183.6 lb

## 2018-07-27 DIAGNOSIS — J019 Acute sinusitis, unspecified: Secondary | ICD-10-CM

## 2018-07-27 MED ORDER — AMOXICILLIN 500 MG PO TABS: 500.0000 mg | ORAL_TABLET | Freq: Three times a day (TID) | ORAL | 0 refills | Status: DC

## 2018-07-27 NOTE — Progress Notes (Signed)
   Subjective:    Patient ID: Gregory Thompson, male    DOB: 05/14/1966, 52 y.o.   MRN: [id number]  Cough  This is a new problem. The current episode started in the past 7 days. The cough is non-productive. Associated symptoms include ear pain, a fever, headaches, rhinorrhea and a sore throat. Pertinent negatives include no chest pain, chills or wheezing. Treatments tried: cold meds. The treatment provided mild relief.  Patient with a lot of sinus pressure pain discomfort denies wheezing difficulty breathing denies sweats chills vomiting.  PMH benign    Review of Systems  Constitutional: Positive for fever. Negative for activity change and chills.  HENT: Positive for congestion, ear pain, rhinorrhea and sore throat.   Eyes: Negative for discharge.  Respiratory: Positive for cough. Negative for wheezing.   Cardiovascular: Negative for chest pain.  Gastrointestinal: Negative for nausea and vomiting.  Musculoskeletal: Negative for arthralgias.  Neurological: Positive for headaches.       Objective:   Physical Exam  Constitutional: He appears well-developed.  HENT:  Head: Normocephalic.  Mouth/Throat: Oropharynx is clear and moist. No oropharyngeal exudate.  Neck: Normal range of motion.  Cardiovascular: Normal rate, regular rhythm and normal heart sounds.  No murmur heard. Pulmonary/Chest: Effort normal and breath sounds normal. He has no wheezes.  Lymphadenopathy:    He has no cervical adenopathy.  Neurological: He exhibits normal muscle tone.  Skin: Skin is warm and dry.  Nursing note and vitals reviewed.         Assessment & Plan:  Acute rhinosinusitis Antibiotics prescribed warning signs discussed Follow-up if progressive troubles or if worse Follow-up if any issues

## 2018-07-30 IMAGING — CT CT ABD-PELV W/O CM
2 of 4 series · 16 of 46 positions shown, 18 images · non-contrast
Comparison: CT abdomen and pelvis 08/10/2016.

CLINICAL DATA: Jaundice for 3 days.

EXAM:
CT ABDOMEN AND PELVIS WITHOUT CONTRAST
TECHNIQUE: Multidetector CT imaging of the abdomen and pelvis was performed
following the standard protocol without IV contrast.

[Series 2: axial st · axial · 0.71mm/px · z∈[+933,+1373]mm · 13 of 96 slices shown, 15 images]
[im 4/96  soft-tissue]
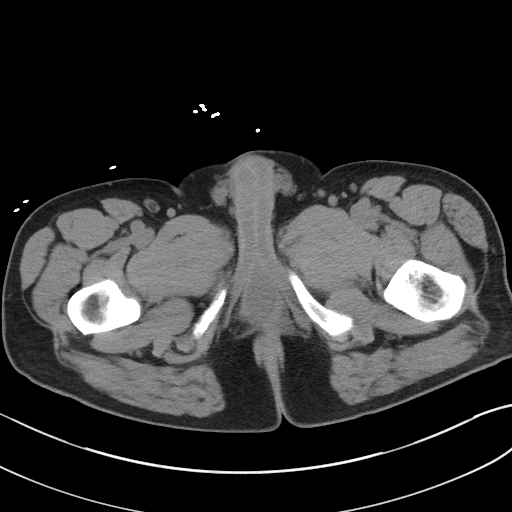
[im 4/96  bone]
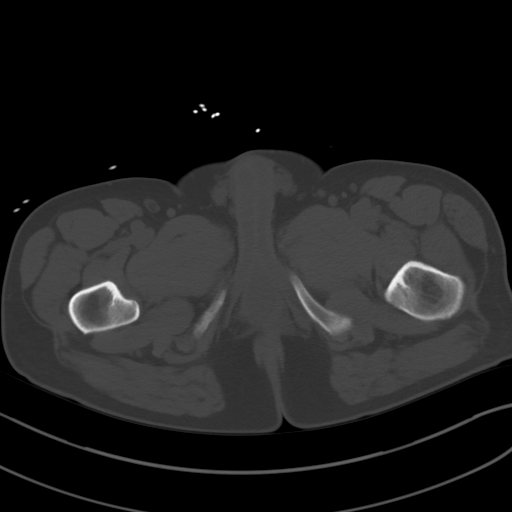
[im 12/96  soft-tissue]
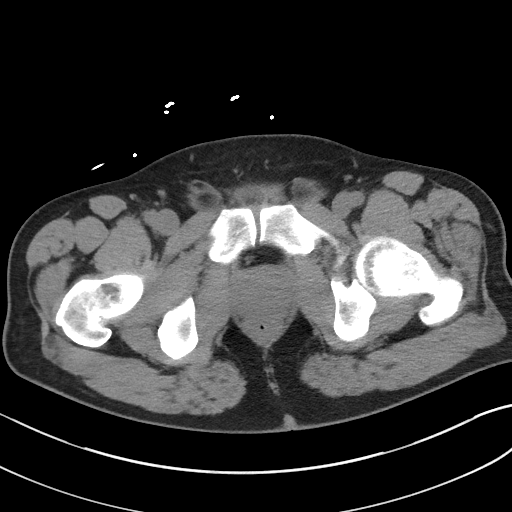
[im 20/96  soft-tissue]
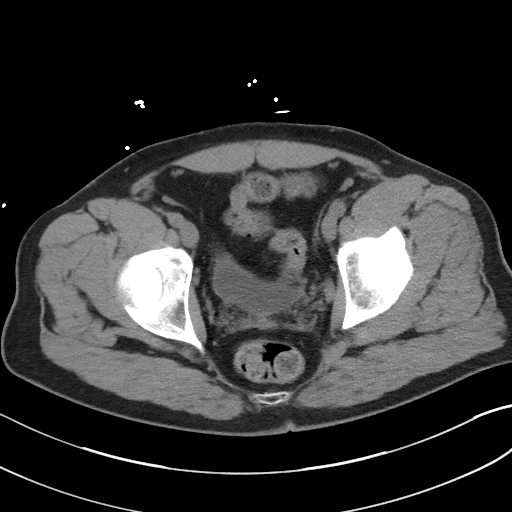
[im 27/96  soft-tissue]
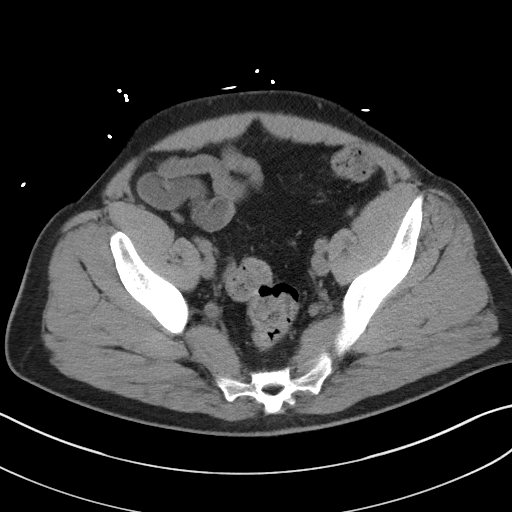
[im 35/96  soft-tissue]
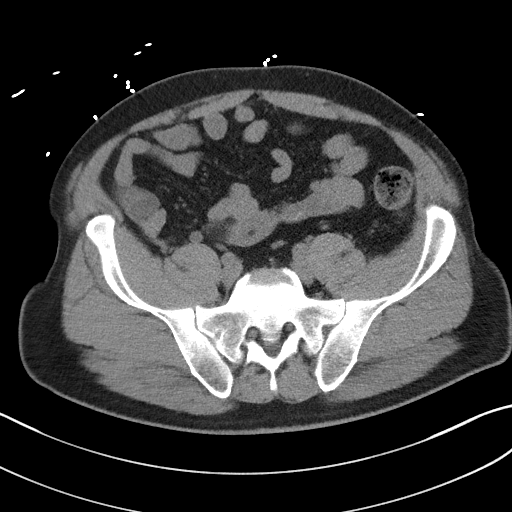
[im 42/96  soft-tissue]
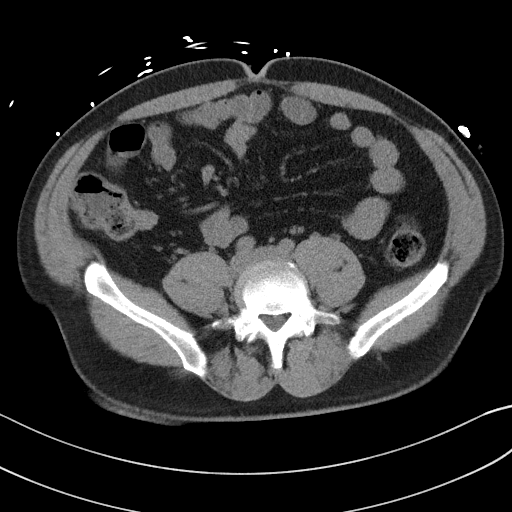
[im 50/96  soft-tissue]
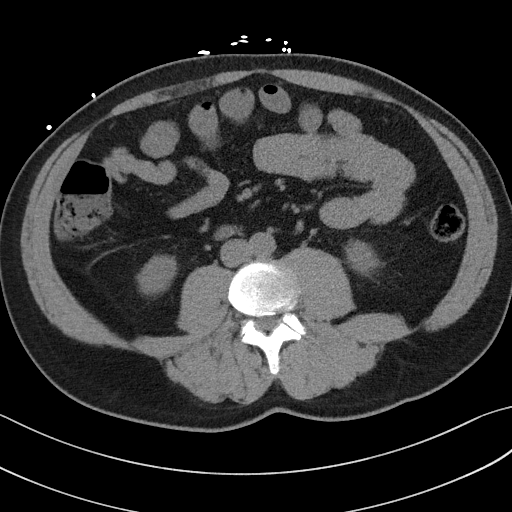
[im 54/96  soft-tissue]
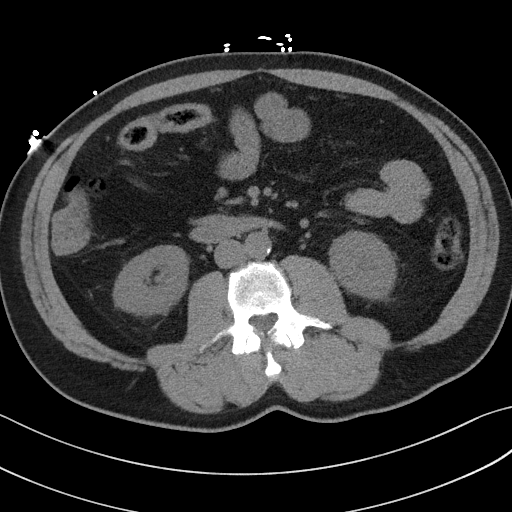
[im 61/96  soft-tissue]
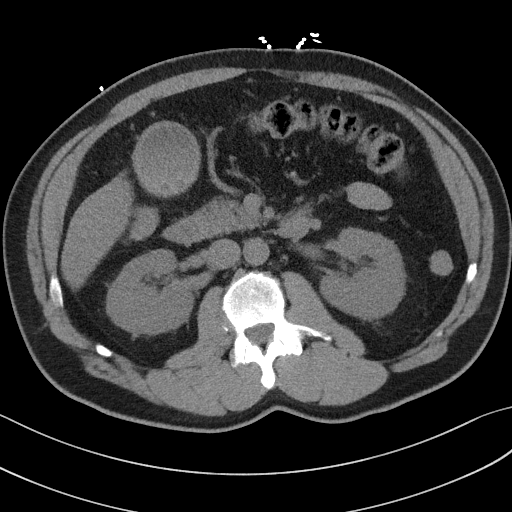
[im 61/96  bone]
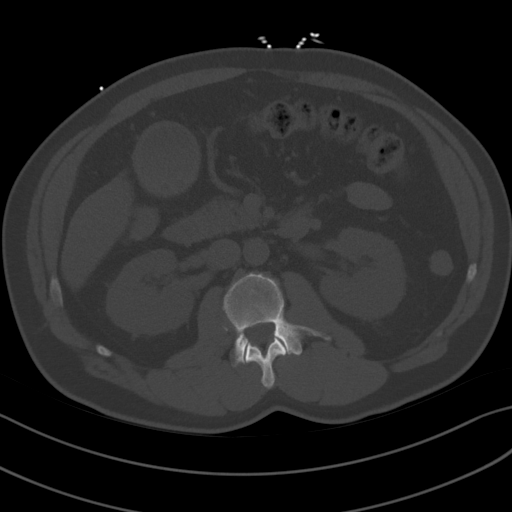
[im 69/96  soft-tissue]
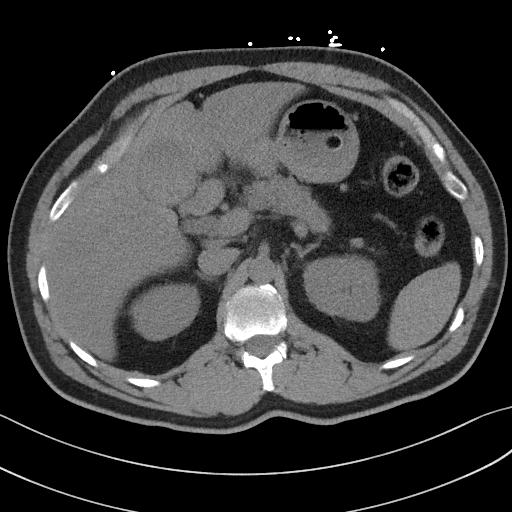
[im 77/96  soft-tissue]
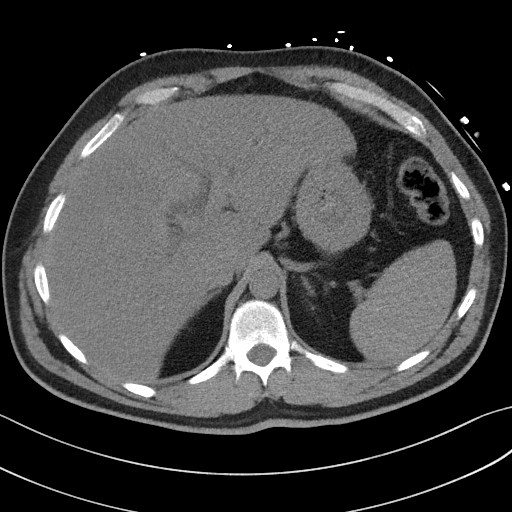
[im 84/96  soft-tissue]
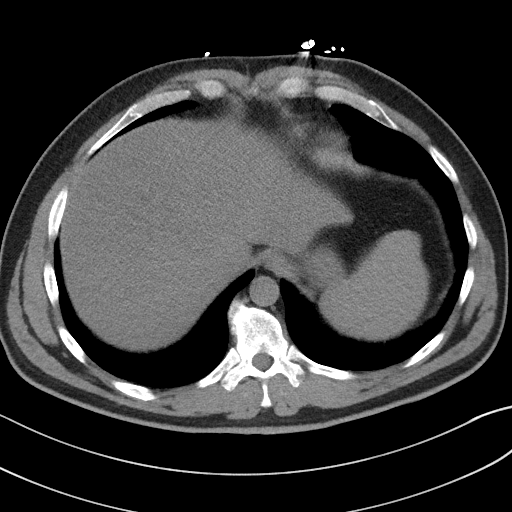
[im 92/96  soft-tissue]
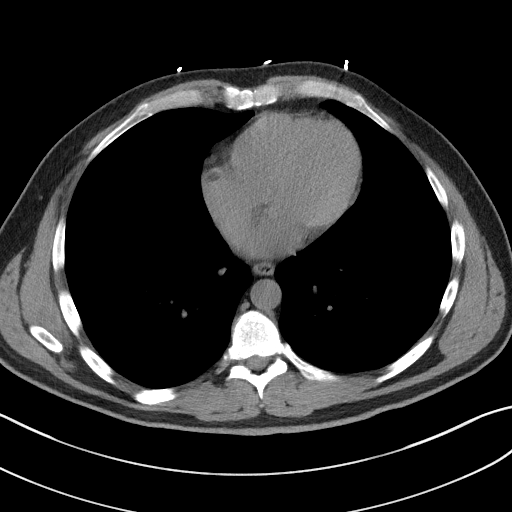

[Series 5: coronal st · coronal · 0.76mm/px · 3 of 110 slices shown]
[im 37/110  soft-tissue]
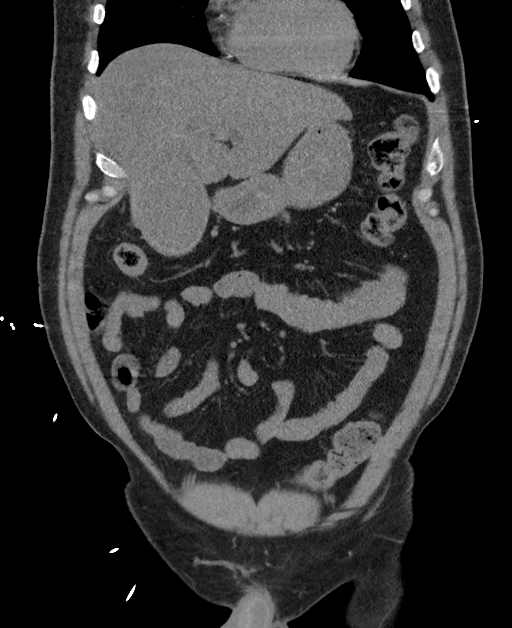
[im 49/110  soft-tissue]
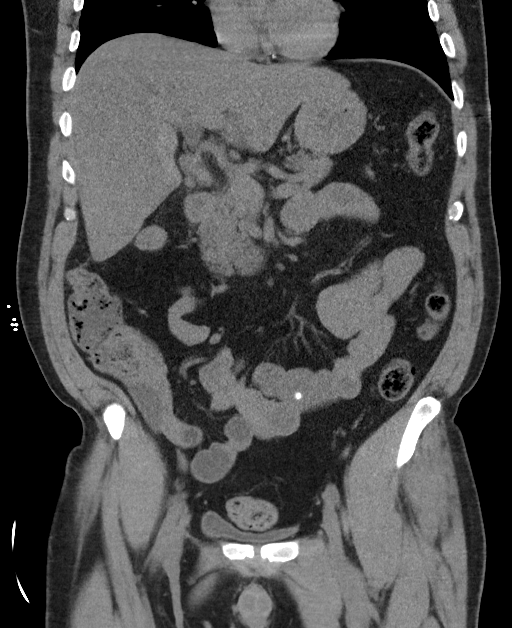
[im 61/110  soft-tissue]
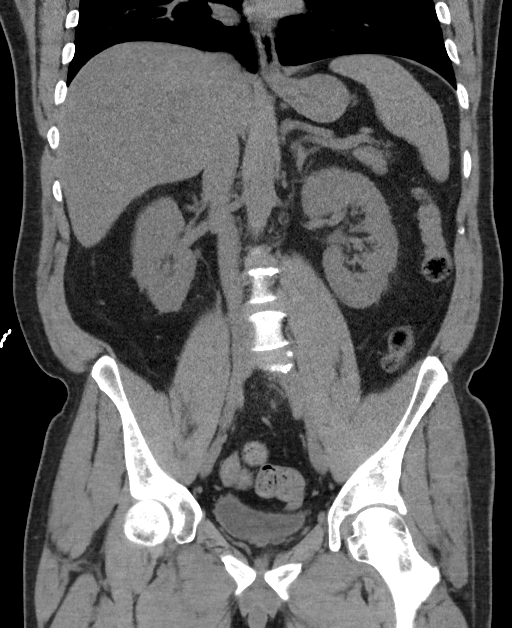

[16 of 46 positions shown; findings below may reference images not displayed]

FINDINGS: Lower chest: Lung bases are clear. No pleural or pericardial
effusion. Heart size is normal.

Hepatobiliary: The liver is diffusely low attenuating. The liver
border appears smooth. No hypertrophy of the caudate lobe or atrophy
of the remainder of the liver is identified. The gallbladder is
distended with stones and sludge present. The gallbladder wall
appears thickened at 0.6 cm and there may be a small amount of
pericholecystic fluid. A stone is seen in the neck of the
gallbladder. Biliary tree is unremarkable.

Pancreas: Unremarkable. No pancreatic ductal dilatation or
surrounding inflammatory changes.

Spleen: Normal in size without focal abnormality.

Adrenals/Urinary Tract: Adrenal glands are unremarkable. Kidneys are
normal, without renal calculi, focal lesion, or hydronephrosis.
Bladder is unremarkable.

Stomach/Bowel: Stomach is within normal limits. Appendix appears
normal. No evidence of bowel wall thickening, distention, or
inflammatory changes.

Vascular/Lymphatic: No significant vascular findings are present. No
enlarged abdominal or pelvic lymph nodes.

Reproductive: Prostate is unremarkable.

Other: No ascites.  No hernia.

Musculoskeletal: No acute or focal abnormality. Degenerative disc
disease lower lumbar spine noted.
IMPRESSION: Gallstones, sludge and gallbladder wall thickening worrisome for
cholecystitis. Right upper quadrant ultrasound is recommended for
further evaluation.

Diffuse fatty infiltration of the liver.

## 2018-08-01 IMAGING — RF DG ERCP WO/W SPHINCTEROTOMY
1 series · 14 of 16 positions shown · non-contrast
Comparison: CT abdomen pelvis-02/06/2018;

CLINICAL DATA: ERCP.  Concern for choledocholithiasis.

EXAM:
ERCP
TECHNIQUE: Multiple spot images obtained with the fluoroscopic device and
submitted for interpretation post-procedure.

[Series 1: run · 14 of 16 slices shown]
[im 1/16]
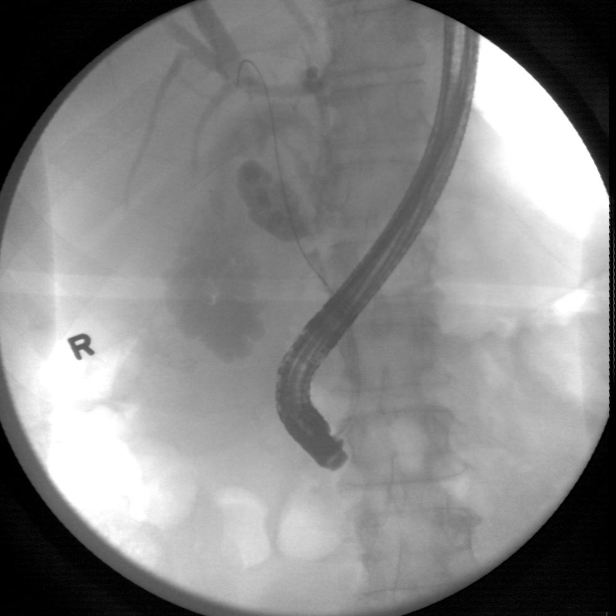
[im 2/16]
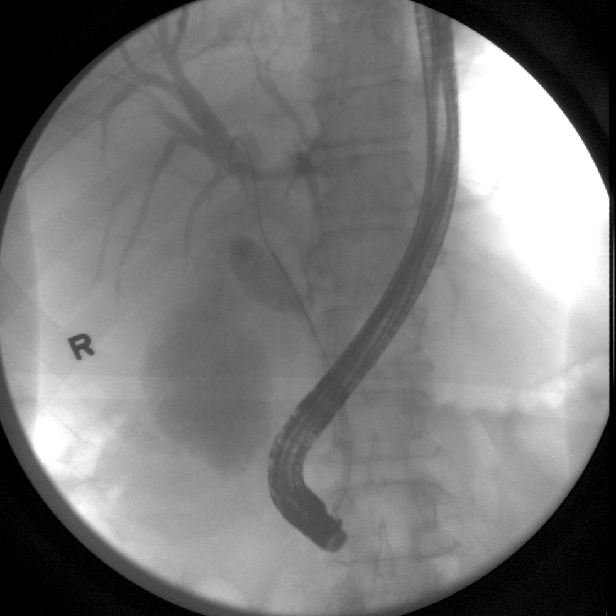
[im 3/16]
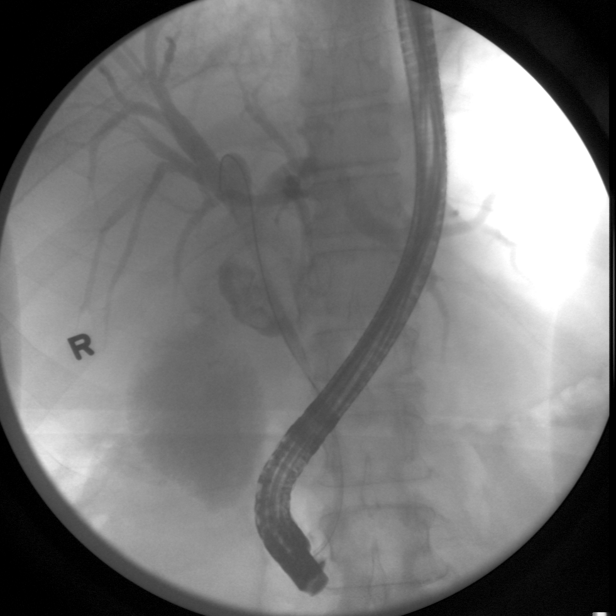
[im 5/16]
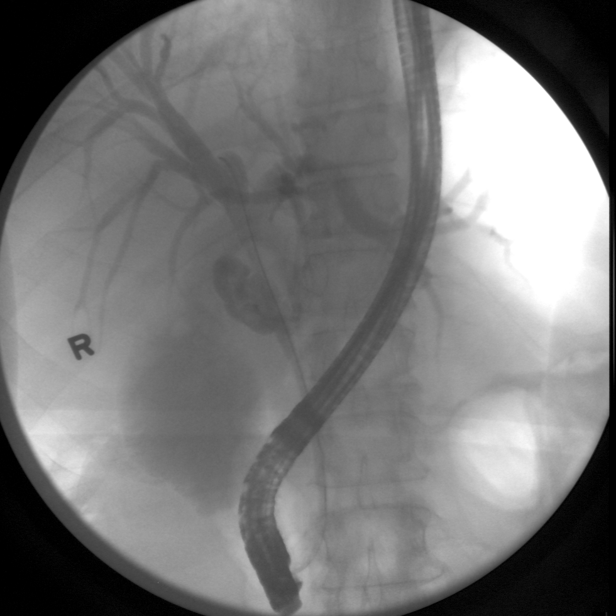
[im 6/16]
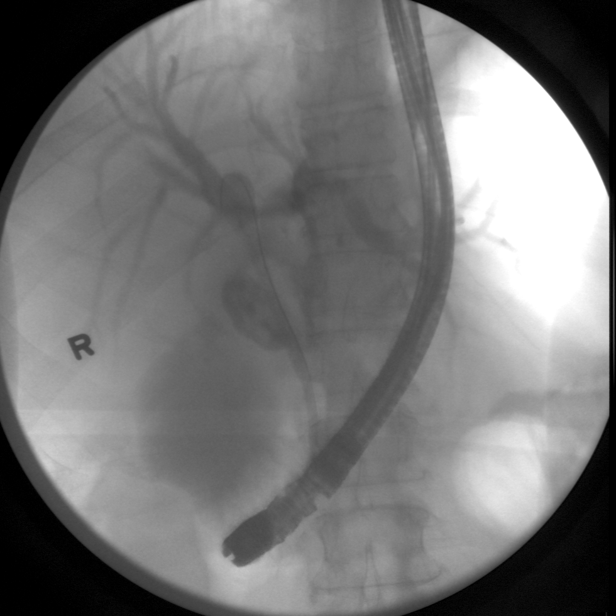
[im 7/16]
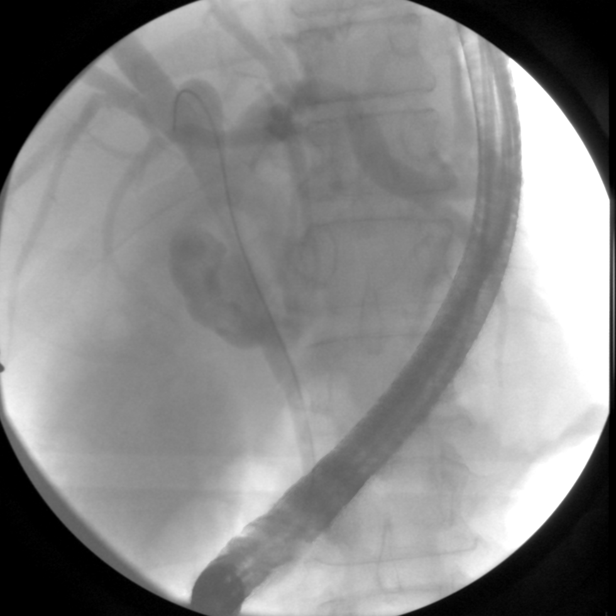
[im 8/16]
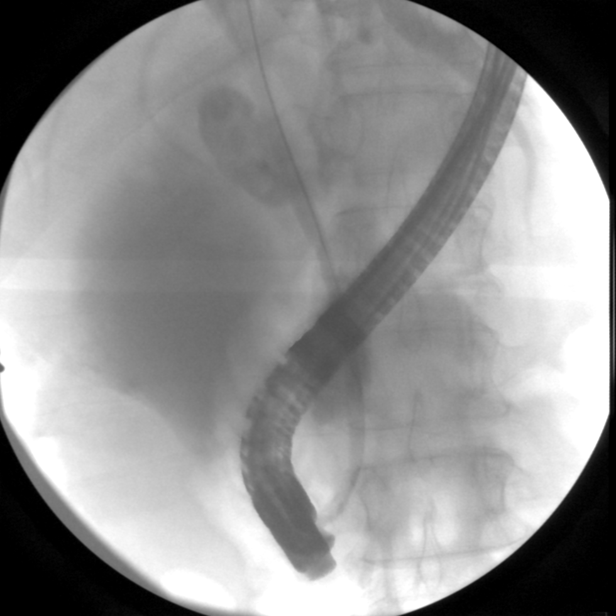
[im 9/16]
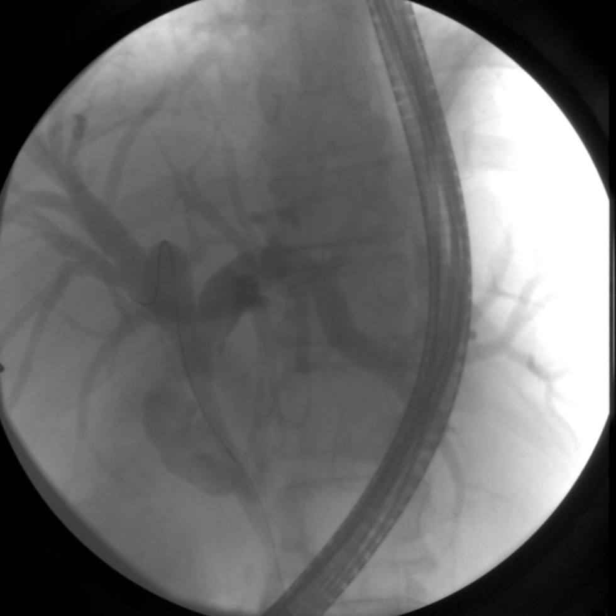
[im 10/16]
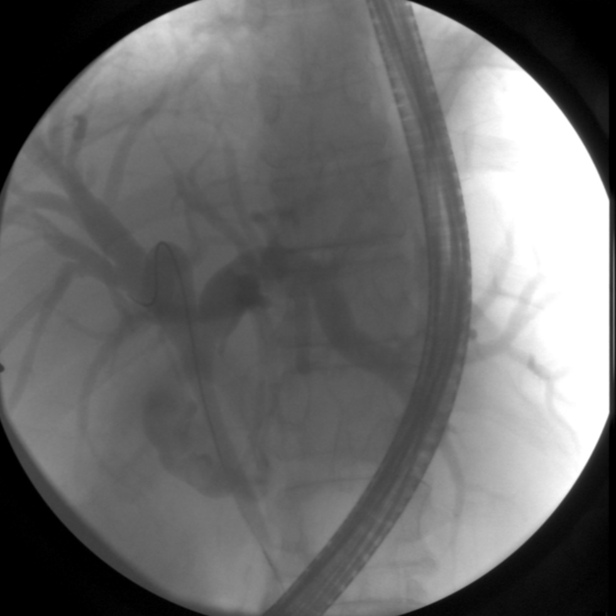
[im 11/16]
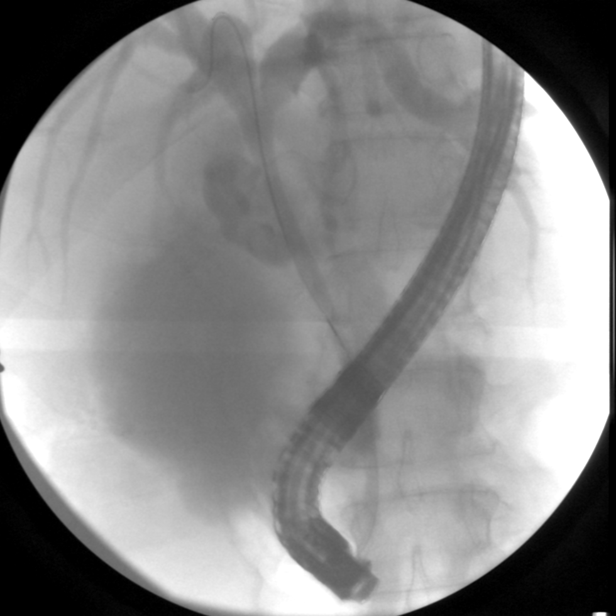
[im 13/16]
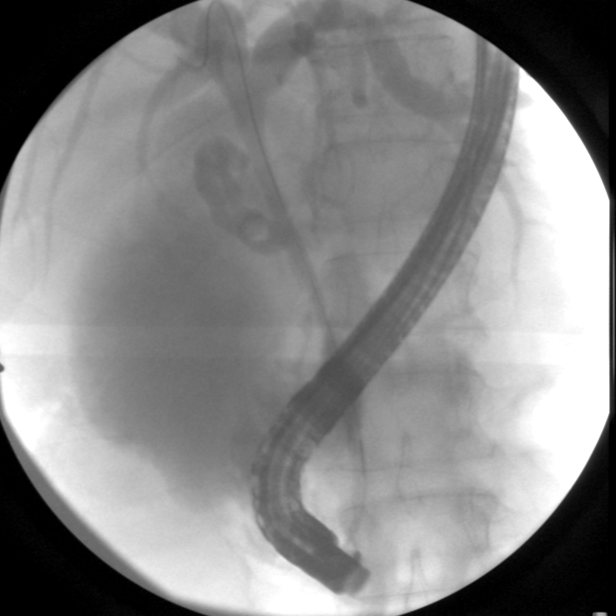
[im 14/16]
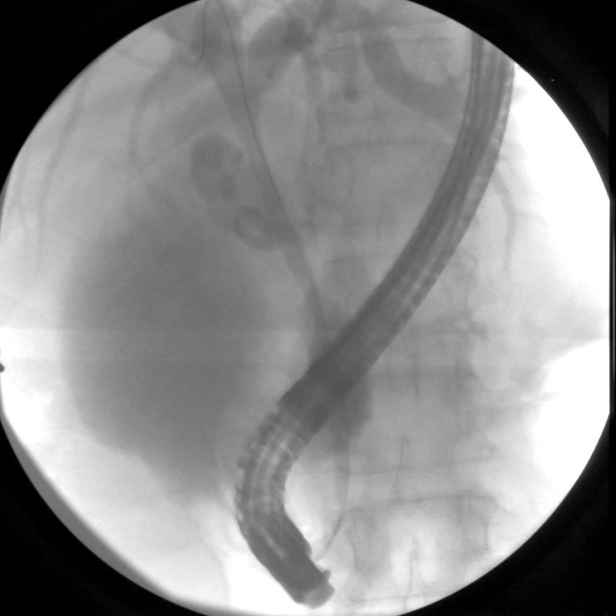
[im 15/16]
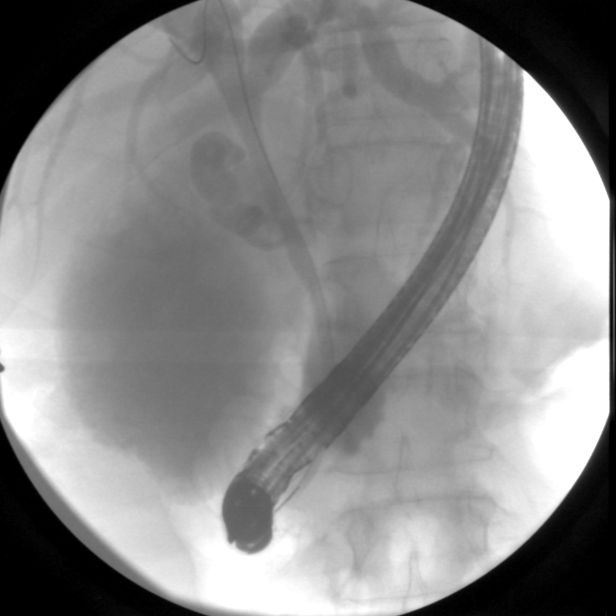
[im 16/16]
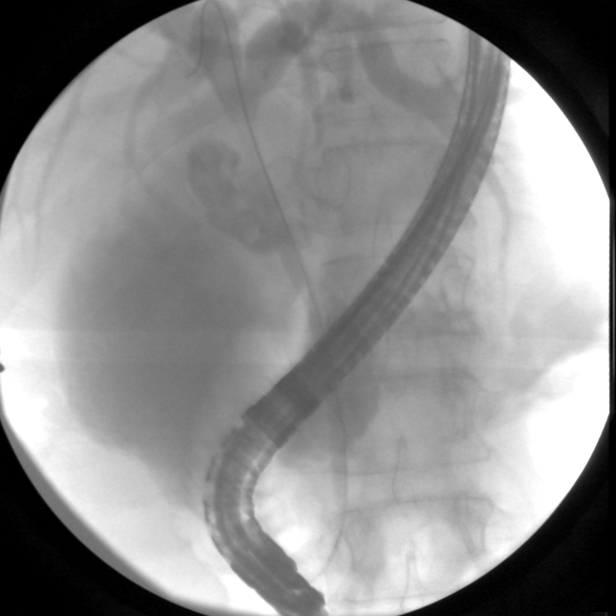

[14 of 16 positions shown; findings below may reference images not displayed]

right upper quadrant
abdominal ultrasound-02/07/2018

FLUOROSCOPY TIME:  5 minutes, 22 seconds
FINDINGS: Sixteen spot intraoperative fluoroscopic images of the right upper
abdominal quadrant during ERCP are provided for review

Initial image demonstrates an ERCP probe overlying the right upper
abdominal quadrant. There is selective cannulation and faint
opacification of the common bile duct.

There is faint opacification of the cystic duct which contains
several nonocclusive filling defects suggestive of
choledocholithiasis.

There are no discrete persistent filling defects with opacified
portion of the common bile duct. There is minimal opacification
intrahepatic biliary system which appears mildly dilated.
IMPRESSION: ERCP with presumed stones within the cystic duct but not
definitively within the common bile duct as detailed above.

These images were submitted for radiologic interpretation only.
Please see the procedural report for the amount of contrast and the
fluoroscopy time utilized.

## 2018-08-07 ENCOUNTER — Other Ambulatory Visit: Payer: Self-pay | Admitting: Family Medicine

## 2018-08-22 ENCOUNTER — Other Ambulatory Visit: Payer: Self-pay | Admitting: Family Medicine

## 2018-08-22 ENCOUNTER — Other Ambulatory Visit: Payer: Self-pay

## 2018-08-22 ENCOUNTER — Telehealth: Payer: Self-pay | Admitting: Family Medicine

## 2018-08-22 MED ORDER — ALPRAZOLAM 0.5 MG PO TABS: ORAL_TABLET | ORAL | 0 refills | Status: DC

## 2018-08-22 MED ORDER — ALPRAZOLAM 0.25 MG PO TABS: ORAL_TABLET | ORAL | 1 refills | Status: DC

## 2018-08-22 NOTE — Telephone Encounter (Signed)
Pt called stating that [facility] [place] has sent over numerous request for Alprazolam 0.25 mg but it has been rejected. Pt is suppose to be on Alprazolam 0.5mg  but pharmacy states they are out of 0.5 mg. Pt would like to try to come off Alprazolam if possible. Pt states he is willing to try Alprazolam 0.25 mg. Pt states he does not know if he should quite cold [place] or not. Pt states at this time he is taking 0.5 mg each night. Please advise. [facility] [place] and call patient back with information.

## 2018-08-22 NOTE — Telephone Encounter (Signed)
Script printed awaiting signature. Pt is aware and verbalized understanding.

## 2018-08-22 NOTE — Telephone Encounter (Signed)
Sent in xanax 0.25 mg script before lunch. [facility] [place] [facility] in [facility]. [facility] [place] [facility] 0.5 mg in stock. Pt would like 0.5 mg faxed to [place] [facility]. Just need clarification on directions since patient wants to step down. Please advise.

## 2018-08-22 NOTE — Telephone Encounter (Signed)
Script printed and awaiting signature. Pt is aware of directions. [facility] [place]

## 2018-08-22 NOTE — Telephone Encounter (Signed)
So at this point in order to get away from the Xanax First step would be using 0.25 mg nightly After doing this for the next 3 to 4 weeks then to use a half of the 0.25 mg nightly for 3 to 4 weeks then a quarter of a 0.25 mg tablet nightly for a couple weeks  May send him into his pharmacy Xanax 0.25 mg, #30, use one as directed at bedtime, 1 refill

## 2018-08-22 NOTE — Telephone Encounter (Signed)
Xanax 0.5 mg, #30, 1 refill, directions half to whole nightly as needed   As for what to tell the patient I recommend he use 1/2 tablet each night for the next 3 weeks then drop it down to 1/4 tablet nightly for 3 weeks then try stopping the medicine  If any ongoing troubles to notify us

## 2018-09-18 ENCOUNTER — Other Ambulatory Visit: Payer: Self-pay | Admitting: Family Medicine

## 2018-09-22 ENCOUNTER — Other Ambulatory Visit: Payer: Self-pay | Admitting: Family Medicine

## 2018-10-03 ENCOUNTER — Other Ambulatory Visit: Payer: Self-pay | Admitting: Family Medicine

## 2018-10-03 NOTE — Telephone Encounter (Signed)
This +2 refills 

## 2018-10-03 NOTE — Telephone Encounter (Signed)
Pt needs ALPRAZolam (XANAX) 0.25 MG tablet to be sent to [place] [facility].   Also there was a discussion in regards of getting off of this medication. Pt's wife states that patient will not be able to do so at this time.

## 2018-11-05 LAB — LIPID PANEL
Chol/HDL Ratio: 4.4 ratio (ref 0.0–5.0)
Cholesterol, Total: 129 mg/dL (ref 100–199)
HDL: 29 mg/dL — ABNORMAL LOW (ref >39)
LDL Calculated: 70 mg/dL (ref 0–99)
Triglycerides: 149 mg/dL (ref 0–149)
VLDL Cholesterol Cal: 30 mg/dL (ref 5–40)

## 2018-11-05 LAB — HEPATIC FUNCTION PANEL
ALBUMIN: 4.5 g/dL (ref 3.8–4.9)
ALK PHOS: 57 IU/L (ref 39–117)
ALT: 48 IU/L — ABNORMAL HIGH (ref 0–44)
AST: 29 IU/L (ref 0–40)
BILIRUBIN, DIRECT: 0.2 mg/dL (ref 0.00–0.40)
Bilirubin Total: 0.6 mg/dL (ref 0.0–1.2)
Total Protein: 7.6 g/dL (ref 6.0–8.5)

## 2018-12-21 ENCOUNTER — Other Ambulatory Visit: Payer: Self-pay | Admitting: Family Medicine

## 2019-01-02 ENCOUNTER — Other Ambulatory Visit: Payer: Self-pay | Admitting: Family Medicine

## 2019-01-02 NOTE — Telephone Encounter (Signed)
1 refill please 

## 2019-01-18 ENCOUNTER — Ambulatory Visit (INDEPENDENT_AMBULATORY_CARE_PROVIDER_SITE_OTHER): Payer: 59 | Admitting: Family Medicine

## 2019-01-18 ENCOUNTER — Other Ambulatory Visit: Payer: Self-pay

## 2019-01-18 DIAGNOSIS — E785 Hyperlipidemia, unspecified: Secondary | ICD-10-CM

## 2019-01-18 DIAGNOSIS — I10 Essential (primary) hypertension: Secondary | ICD-10-CM | POA: Diagnosis not present

## 2019-01-18 DIAGNOSIS — Z125 Encounter for screening for malignant neoplasm of prostate: Secondary | ICD-10-CM

## 2019-01-18 DIAGNOSIS — G4700 Insomnia, unspecified: Secondary | ICD-10-CM

## 2019-01-18 DIAGNOSIS — E119 Type 2 diabetes mellitus without complications: Secondary | ICD-10-CM | POA: Diagnosis not present

## 2019-01-18 DIAGNOSIS — R945 Abnormal results of liver function studies: Secondary | ICD-10-CM

## 2019-01-18 DIAGNOSIS — R7989 Other specified abnormal findings of blood chemistry: Secondary | ICD-10-CM

## 2019-01-18 MED ORDER — GEMFIBROZIL 600 MG PO TABS: ORAL_TABLET | ORAL | 1 refills | Status: DC

## 2019-01-18 MED ORDER — GLIPIZIDE ER 5 MG PO TB24: ORAL_TABLET | ORAL | 1 refills | Status: DC

## 2019-01-18 MED ORDER — HYOSCYAMINE SULFATE ER 0.375 MG PO TB12: 0.3750 mg | ORAL_TABLET | Freq: Two times a day (BID) | ORAL | 1 refills | Status: DC | PRN

## 2019-01-18 MED ORDER — PRAVASTATIN SODIUM 20 MG PO TABS: ORAL_TABLET | ORAL | 1 refills | Status: DC

## 2019-01-18 MED ORDER — ALLOPURINOL 300 MG PO TABS: 300.0000 mg | ORAL_TABLET | Freq: Every day | ORAL | 1 refills | Status: DC

## 2019-01-18 MED ORDER — PRAVASTATIN SODIUM 20 MG PO TABS: 20.0000 mg | ORAL_TABLET | Freq: Every day | ORAL | 1 refills | Status: DC

## 2019-01-18 MED ORDER — ALPRAZOLAM 0.5 MG PO TABS: ORAL_TABLET | ORAL | 5 refills | Status: DC

## 2019-01-18 MED ORDER — METFORMIN HCL ER 500 MG PO TB24: ORAL_TABLET | ORAL | 1 refills | Status: DC

## 2019-01-18 MED ORDER — PANTOPRAZOLE SODIUM 40 MG PO TBEC: 40.0000 mg | DELAYED_RELEASE_TABLET | Freq: Two times a day (BID) | ORAL | 1 refills | Status: DC

## 2019-01-18 NOTE — Progress Notes (Signed)
   Subjective:  A/V  Pati A/V Ent ID: Gregory Thompson, male    DOB: [birth year], 53 y.o.   MRN: [id number]  Hyperlipidemia  This is a chronic problem. Pertinent negatives include no chest pain or shortness of breath. There are no compliance problems.  Risk factors for coronary artery disease include hypertension.  Hypertension  This is a chronic problem. Pertinent negatives include no chest pain, headaches or shortness of breath. There are no compliance problems.    Pt states that he is doing good. No other questions or concerns.  Patient does have IBS will need refills on his medications. Denies being depressed but does have insomnia related issues uses alprazolam Has history of gout no flareups recently takes his allopurinol regular basis Severe triglyceride issues uses gemfibrozil every other day Diabetes under decent control denies any low sugar spells will continue his medications Has history of elevated liver functions further follow-up of liver functions recommended Patient taking half of a 20 mg pravastatin daily states he tolerates that higher doses he has muscle discomforts Patient has been counseled to quit smoking Virtual Visit via Video Note  I connected with Gregory Thompson on 01/18/19 at  1:10 PM EDT by a video enabled telemedicine application and verified that I am speaking with the correct person using two identifiers.  Location: Patient: home Provider: office   I discussed the limitations of evaluation and management by telemedicine and the availability of in person appointments. The patient expressed understanding and agreed to proceed.  History of Present Illness:    Observations/Objective:   Assessment and Plan:   Follow Up Instructions:    I discussed the assessment and treatment plan with the patient. The patient was provided an opportunity to ask questions and all were answered. The patient agreed with the plan and demonstrated an understanding of the  instructions.   The patient was advised to call back or seek an in-person evaluation if the symptoms worsen or if the condition fails to improve as anticipated.  I provided 15 minutes of non-face-to-face time during this encounter.   Marlowe Shores, LPN   Review of Systems  Constitutional: Negative for diaphoresis and fatigue.  HENT: Negative for congestion and rhinorrhea.   Respiratory: Negative for cough and shortness of breath.   Cardiovascular: Negative for chest pain and leg swelling.  Gastrointestinal: Negative for abdominal pain and diarrhea.  Skin: Negative for color change and rash.  Neurological: Negative for dizziness and headaches.  Psychiatric/Behavioral: Negative for behavioral problems and confusion.       Objective:   Physical Exam   No apparent distress difficult to do exam via interface     Assessment & Plan:  Blood pressure reportedly good control continue current measures  Hyperlipidemia previous labs look good new labs later this summer Continue cholesterol medicine half tablet of pravastatin daily plus also gemfibrozil every other day Watch diet try to keep weight in check follow-up liver enzymes later this year Xanax at nighttime to help with insomnia Diabetes good control continue current measures Wellness check plus chronic health issues later this summer early fall

## 2019-01-20 ENCOUNTER — Ambulatory Visit: Payer: 59 | Admitting: Family Medicine

## 2019-01-23 ENCOUNTER — Ambulatory Visit: Payer: 59 | Admitting: Family Medicine

## 2019-01-24 ENCOUNTER — Other Ambulatory Visit: Payer: Self-pay | Admitting: Family Medicine

## 2019-02-02 ENCOUNTER — Other Ambulatory Visit: Payer: Self-pay | Admitting: Family Medicine

## 2019-02-20 ENCOUNTER — Other Ambulatory Visit: Payer: Self-pay | Admitting: Family Medicine

## 2019-02-22 ENCOUNTER — Other Ambulatory Visit: Payer: Self-pay | Admitting: Family Medicine

## 2019-04-04 ENCOUNTER — Ambulatory Visit (INDEPENDENT_AMBULATORY_CARE_PROVIDER_SITE_OTHER): Payer: 59 | Admitting: Family Medicine

## 2019-04-04 ENCOUNTER — Other Ambulatory Visit: Payer: Self-pay

## 2019-04-04 DIAGNOSIS — F439 Reaction to severe stress, unspecified: Secondary | ICD-10-CM

## 2019-04-04 NOTE — Progress Notes (Signed)
   Subjective:    Patient ID: Gregory Thompson, male    DOB: 1966/05/20, 53 y.o.   MRN: [id number]  HPI Pt states he is having blister on feet. Pt is diabetic and states he does not want this to turn into diabetic ulcers. Pt states that on his left foot; he has a hard knot that is red and sore between his little toe and the toe before it. Pt also states above the joint on his 2nd toe, it gets red. No pain but sore.   Pt also states that is having some nerve issues. Pt states his mom has dementia and is "getting on his nerves". Pt states mom is getting worse and will not come back to see doctors and is mad that license were taken away. Pt states he is always ill or ornery and is not getting much sleep. \  Virtual Visit via Video Note  I connected with Gregory Thompson on 04/04/19 at  1:10 PM EDT by a video enabled telemedicine application and verified that I am speaking with the correct person using two identifiers.  Location: Patient: home Provider: office    I discussed the limitations of evaluation and management by telemedicine and the availability of in person appointments. The patient expressed understanding and agreed to proceed.  History of Present Illness:    Observations/Objective:   Assessment and Plan:   Follow Up Instructions:    I discussed the assessment and treatment plan with the patient. The patient was provided an opportunity to ask questions and all were answered. The patient agreed with the plan and demonstrated an understanding of the instructions.   The patient was advised to call back or seek an in-person evaluation if the symptoms worsen or if the condition fails to improve as anticipated.  I provided 15 minutes of non-face-to-face time during this encounter.   Vicente Males, LPN    Review of Systems  Constitutional: Negative for diaphoresis and fatigue.  HENT: Negative for congestion and rhinorrhea.   Respiratory: Negative for cough and shortness  of breath.   Cardiovascular: Negative for chest pain and leg swelling.  Gastrointestinal: Negative for abdominal pain and diarrhea.  Skin: Negative for color change and rash.  Neurological: Negative for dizziness and headaches.  Psychiatric/Behavioral: Negative for behavioral problems and confusion.       Objective:   Physical Exam   Today's visit was via telephone Physical exam was not possible for this visit      Assessment & Plan:  Because the patient is having a foot issue that needs visualization he will come here at the office on Friday so we can examine that area  As for stress related issues I would recommend a low-dose antidepressant he will think about it we will discuss it more on Friday

## 2019-04-07 ENCOUNTER — Encounter: Payer: Self-pay | Admitting: Family Medicine

## 2019-04-07 ENCOUNTER — Other Ambulatory Visit: Payer: Self-pay

## 2019-04-07 ENCOUNTER — Ambulatory Visit (INDEPENDENT_AMBULATORY_CARE_PROVIDER_SITE_OTHER): Payer: 59 | Admitting: Family Medicine

## 2019-04-07 VITALS — BP 134/80 | Temp 97.2°F | Wt 182.8 lb

## 2019-04-07 DIAGNOSIS — E785 Hyperlipidemia, unspecified: Secondary | ICD-10-CM

## 2019-04-07 DIAGNOSIS — I10 Essential (primary) hypertension: Secondary | ICD-10-CM

## 2019-04-07 DIAGNOSIS — E119 Type 2 diabetes mellitus without complications: Secondary | ICD-10-CM | POA: Diagnosis not present

## 2019-04-07 MED ORDER — GEMFIBROZIL 600 MG PO TABS: ORAL_TABLET | ORAL | 1 refills | Status: DC

## 2019-04-07 MED ORDER — LOSARTAN POTASSIUM 50 MG PO TABS: 50.0000 mg | ORAL_TABLET | Freq: Every day | ORAL | 1 refills | Status: DC

## 2019-04-07 NOTE — Progress Notes (Signed)
Subjective:    Patient ID: Gregory BalesStephen D Thompson, male    DOB: 03/25/1966, 53 y.o.   MRN: [id number]  HPI Pt here today for follow up on toe. Pt had phone visit with Dr.Scott on Wednesday regarding knot on toe on left foot. Pt is diabetic and is wanting to make sure he can get it treated before it gets bad. Area gets sore and red at times. Pt does wear steel toe shoes at work.  Pt noticed the area about a month or so ago.   Patient for blood pressure check up.  The patient does have hypertension.  The patient is on medication.  Patient relates compliance with meds. Todays BP reviewed with the patient. Patient denies issues with medication. Patient relates reasonable diet. Patient tries to minimize salt. Patient aware of BP goals.  Patient here for follow-up regarding cholesterol.  The patient does have hyperlipidemia.  Patient does try to maintain a reasonable diet.  Patient does take the medication on a regular basis.  Denies missing a dose.  The patient denies any obvious side effects.  Prior blood work results reviewed with the patient.  The patient is aware of his cholesterol goals and the need to keep it under good control to lessen the risk of disease.  The patient was seen today as part of a comprehensive diabetic check up.the patient does have diabetes.  The patient follows here on a regular basis.  The patient relates medication compliance. No significant side effects to the medications. Denies any low glucose spells. Relates compliance with diet to a reasonable level. Patient does do labwork intermittently and understands the dangers of diabetes.    Review of Systems  Constitutional: Negative for diaphoresis and fatigue.  HENT: Negative for congestion and rhinorrhea.   Respiratory: Negative for cough and shortness of breath.   Cardiovascular: Negative for chest pain and leg swelling.  Gastrointestinal: Negative for abdominal pain and diarrhea.  Skin: Negative for color change and rash.   Neurological: Negative for dizziness and headaches.  Psychiatric/Behavioral: Negative for behavioral problems and confusion.       Objective:   Physical Exam Vitals signs reviewed.  Constitutional:      General: He is not in acute distress. HENT:     Head: Normocephalic and atraumatic.  Eyes:     General:        Right eye: No discharge.        Left eye: No discharge.  Neck:     Trachea: No tracheal deviation.  Cardiovascular:     Rate and Rhythm: Normal rate and regular rhythm.     Heart sounds: Normal heart sounds. No murmur.  Pulmonary:     Effort: Pulmonary effort is normal. No respiratory distress.     Breath sounds: Normal breath sounds. No wheezing.  Lymphadenopathy:     Cervical: No cervical adenopathy.  Skin:    General: Skin is warm.     Findings: No rash.  Neurological:     Mental Status: He is alert.  Psychiatric:        Behavior: Behavior normal.           Assessment & Plan:  The area on the foot appears to be benign he will watch it if it gets worse referral to dermatology  Blood pressure good control continue current measures  Diabetes under good control lab work ordered continue current measures  Hyperlipidemia continue current measures.  Patient has history of severe elevation in triglycerides therefore benefit  of gemfibrozil with pravastatin is reasonable  Follow-up within 6 months

## 2019-04-24 ENCOUNTER — Encounter: Payer: Self-pay | Admitting: Family Medicine

## 2019-05-09 LAB — BASIC METABOLIC PANEL
BUN/Creatinine Ratio: 16 (ref 9–20)
BUN: 16 mg/dL (ref 6–24)
CO2: 24 mmol/L (ref 20–29)
Calcium: 10.1 mg/dL (ref 8.7–10.2)
Chloride: 101 mmol/L (ref 96–106)
Creatinine, Ser: 1 mg/dL (ref 0.76–1.27)
GFR calc Af Amer: 100 mL/min/{1.73_m2} (ref >59)
GFR calc non Af Amer: 87 mL/min/{1.73_m2} (ref >59)
Glucose: 119 mg/dL — ABNORMAL HIGH (ref 65–99)
Potassium: 4.2 mmol/L (ref 3.5–5.2)
Sodium: 139 mmol/L (ref 134–144)

## 2019-05-09 LAB — HEMOGLOBIN A1C
Est. average glucose Bld gHb Est-mCnc: 140 mg/dL
Hgb A1c MFr Bld: 6.5 % — ABNORMAL HIGH (ref 4.8–5.6)

## 2019-05-09 LAB — PSA: Prostate Specific Ag, Serum: 0.5 ng/mL (ref 0.0–4.0)

## 2019-05-09 LAB — HEPATIC FUNCTION PANEL
ALT: 42 IU/L (ref 0–44)
AST: 33 IU/L (ref 0–40)
Albumin: 4.7 g/dL (ref 3.8–4.9)
Alkaline Phosphatase: 48 IU/L (ref 39–117)
Bilirubin Total: 0.5 mg/dL (ref 0.0–1.2)
Bilirubin, Direct: 0.17 mg/dL (ref 0.00–0.40)
Total Protein: 7.8 g/dL (ref 6.0–8.5)

## 2019-05-09 LAB — LIPID PANEL
Chol/HDL Ratio: 5.2 ratio — ABNORMAL HIGH (ref 0.0–5.0)
Cholesterol, Total: 146 mg/dL (ref 100–199)
HDL: 28 mg/dL — ABNORMAL LOW (ref >39)
LDL Chol Calc (NIH): 62 mg/dL (ref 0–99)
Triglycerides: 359 mg/dL — ABNORMAL HIGH (ref 0–149)
VLDL Cholesterol Cal: 56 mg/dL — ABNORMAL HIGH (ref 5–40)

## 2019-05-09 LAB — MICROALBUMIN / CREATININE URINE RATIO
Creatinine, Urine: 92.5 mg/dL
Microalb/Creat Ratio: 22 mg/g creat (ref 0–29)
Microalbumin, Urine: 20.3 ug/mL

## 2019-05-25 ENCOUNTER — Encounter: Payer: Self-pay | Admitting: Family Medicine

## 2019-05-26 ENCOUNTER — Encounter: Payer: Self-pay | Admitting: *Deleted

## 2019-05-26 ENCOUNTER — Other Ambulatory Visit: Payer: Self-pay | Admitting: *Deleted

## 2019-05-26 MED ORDER — DICYCLOMINE HCL 20 MG PO TABS: ORAL_TABLET | ORAL | 0 refills | Status: DC

## 2019-05-26 NOTE — Telephone Encounter (Signed)
Called pt and let him know that bentyl was sent to pharm and follow up if not getting better or if worse.

## 2019-06-15 ENCOUNTER — Telehealth: Payer: Self-pay | Admitting: Family Medicine

## 2019-06-15 NOTE — Telephone Encounter (Signed)
Patient notified and stated he will check with his schedule at work and call us back to set up an appointment.

## 2019-06-15 NOTE — Telephone Encounter (Signed)
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 Also they can go ahead and set up office visit for next week and to give a urine specimen (If they decide to hold on this please document accordingly)

## 2019-06-15 NOTE — Telephone Encounter (Signed)
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 He was told to notify his primary care doctor. Patient states spouse will bring by copy of paper work tomorrow for you to review. [phone]

## 2019-06-19 ENCOUNTER — Ambulatory Visit (INDEPENDENT_AMBULATORY_CARE_PROVIDER_SITE_OTHER): Payer: 59 | Admitting: Family Medicine

## 2019-06-19 ENCOUNTER — Other Ambulatory Visit: Payer: Self-pay

## 2019-06-19 ENCOUNTER — Encounter: Payer: Self-pay | Admitting: Family Medicine

## 2019-06-19 VITALS — BP 134/84 | Temp 95.0°F | Wt 190.4 lb

## 2019-06-19 DIAGNOSIS — R319 Hematuria, unspecified: Secondary | ICD-10-CM

## 2019-06-19 LAB — POCT URINALYSIS DIPSTICK
Spec Grav, UA: <=1.005 — AB (ref 1.010–1.025)
pH, UA: 5 (ref 5.0–8.0)

## 2019-06-19 NOTE — Progress Notes (Signed)
   Subjective:    Patient ID: Gregory Thompson, male    DOB: Jan 18, 1966, 53 y.o.   MRN: [id number]  Hematuria This is a new problem. Episode onset: 06/15/2019. Pertinent negatives include no abdominal pain, dysuria, nausea or vomiting. (Lower back pain)  Patient denies any gross hematuria denies flank pain nausea vomiting denies fever chills dysuria Pt had DOT physical on 06/15/2019 and they did a urine test and it had some blood. Pt states that his DOT physical was done in the morning before eating or drinking a whole lot.   Results for orders placed or performed in visit on 06/19/19  POCT Urinalysis Dipstick  Result Value Ref Range   Color, UA     Clarity, UA     Glucose, UA     Bilirubin, UA +    Ketones, UA     Spec Grav, UA <=1.005 (A) 1.010 - 1.025   Blood, UA     pH, UA 5.0 5.0 - 8.0   Protein, UA     Urobilinogen, UA     Nitrite, UA     Leukocytes, UA Trace (A) Negative   Appearance     Odor      Review of Systems  Constitutional: Negative for activity change.  HENT: Negative for congestion and rhinorrhea.   Respiratory: Negative for cough and shortness of breath.   Cardiovascular: Negative for chest pain and leg swelling.  Gastrointestinal: Negative for abdominal pain, diarrhea, nausea and vomiting.  Genitourinary: Negative for dysuria and hematuria.  Neurological: Negative for weakness and headaches.  Psychiatric/Behavioral: Negative for behavioral problems and confusion.       Objective:   Physical Exam Lungs clear respiratory rate normal heart regular no murmurs extremities no edema skin warm dry neurologic grossly normal flanks nontender abdomen soft       Assessment & Plan:  Reported hematuria on dipstick at DOT visit Under the microscope urine looks good we will send for culture Patient will send [place] 2 separate samples over the next few weeks by early morning urine to allow [place] to look for blood No need for any testing currently.  IBS symptoms related to  stress taking care of his mother who has dementia along with full-time work continue current measures

## 2019-06-21 ENCOUNTER — Other Ambulatory Visit: Payer: Self-pay | Admitting: Family Medicine

## 2019-06-21 LAB — URINE CULTURE

## 2019-06-21 LAB — SPECIMEN STATUS REPORT

## 2019-07-26 ENCOUNTER — Encounter: Payer: Self-pay | Admitting: Family Medicine

## 2019-07-26 ENCOUNTER — Other Ambulatory Visit: Payer: Self-pay | Admitting: Family Medicine

## 2019-07-26 MED ORDER — CLONAZEPAM 0.5 MG PO TABS: 0.5000 mg | ORAL_TABLET | Freq: Two times a day (BID) | ORAL | 1 refills | Status: DC | PRN

## 2019-08-06 ENCOUNTER — Encounter: Payer: Self-pay | Admitting: Family Medicine

## 2019-08-08 ENCOUNTER — Encounter: Payer: Self-pay | Admitting: Family Medicine

## 2019-08-09 ENCOUNTER — Other Ambulatory Visit: Payer: Self-pay | Admitting: Family Medicine

## 2019-08-09 MED ORDER — ALPRAZOLAM 0.5 MG PO TABS: ORAL_TABLET | ORAL | 5 refills | Status: DC

## 2019-09-19 ENCOUNTER — Other Ambulatory Visit: Payer: Self-pay | Admitting: Family Medicine

## 2019-09-19 NOTE — Telephone Encounter (Signed)
1 rf Needs f/u ov virtual (or in person if he refuses virtual)

## 2019-09-20 ENCOUNTER — Other Ambulatory Visit: Payer: Self-pay | Admitting: Family Medicine

## 2019-09-20 NOTE — Telephone Encounter (Signed)
Patient schedule appointment for 1/20 for medication followup

## 2019-09-27 ENCOUNTER — Ambulatory Visit (INDEPENDENT_AMBULATORY_CARE_PROVIDER_SITE_OTHER): Payer: 59 | Admitting: Family Medicine

## 2019-09-27 ENCOUNTER — Other Ambulatory Visit: Payer: Self-pay

## 2019-09-27 DIAGNOSIS — I10 Essential (primary) hypertension: Secondary | ICD-10-CM | POA: Diagnosis not present

## 2019-09-27 DIAGNOSIS — E785 Hyperlipidemia, unspecified: Secondary | ICD-10-CM | POA: Diagnosis not present

## 2019-09-27 MED ORDER — PRAVASTATIN SODIUM 20 MG PO TABS: ORAL_TABLET | ORAL | 1 refills | Status: DC

## 2019-09-27 MED ORDER — GLIPIZIDE ER 5 MG PO TB24: ORAL_TABLET | ORAL | 1 refills | Status: DC

## 2019-09-27 MED ORDER — LOSARTAN POTASSIUM 50 MG PO TABS: ORAL_TABLET | ORAL | 1 refills | Status: DC

## 2019-09-27 MED ORDER — ALLOPURINOL 300 MG PO TABS: 300.0000 mg | ORAL_TABLET | Freq: Every day | ORAL | 1 refills | Status: DC

## 2019-09-27 MED ORDER — GEMFIBROZIL 600 MG PO TABS: ORAL_TABLET | ORAL | 1 refills | Status: DC

## 2019-09-27 MED ORDER — METFORMIN HCL ER 500 MG PO TB24: ORAL_TABLET | ORAL | 1 refills | Status: DC

## 2019-09-27 MED ORDER — PANTOPRAZOLE SODIUM 40 MG PO TBEC: 40.0000 mg | DELAYED_RELEASE_TABLET | Freq: Two times a day (BID) | ORAL | 1 refills | Status: DC

## 2019-09-27 NOTE — Progress Notes (Signed)
   Subjective:    Patient ID: Gregory Thompson, male    DOB: [birth year], 54 y.o.   MRN: [id number]  Diabetes He presents for his follow-up diabetic visit. He has type 2 diabetes mellitus. Pertinent negatives for hypoglycemia include no confusion, dizziness or headaches. Pertinent negatives for diabetes include no chest pain and no fatigue. Current diabetic treatments: glipizide, metformin. He is compliant with treatment all of the time. He is following a generally healthy diet. Exercise: no regular exercise. Home blood sugar record trend: does not check sugar often. Eye exam is current.   Virtual Visit via Telephone Note  I connected with Gregory Thompson on 09/27/19 at 11:00 AM EST by telephone and verified that I am speaking with the correct person using two identifiers.  Location: Patient: home Provider: office   I discussed the limitations, risks, security and privacy concerns of performing an evaluation and management service by telephone and the availability of in person appointments. I also discussed with the patient that there may be a patient responsible charge related to this service. The patient expressed understanding and agreed to proceed.   History of Present Illness:    Observations/Objective:   Assessment and Plan:   Follow Up Instructions:    I discussed the assessment and treatment plan with the patient. The patient was provided an opportunity to ask questions and all were answered. The patient agreed with the plan and demonstrated an understanding of the instructions.   The patient was advised to call back or seek an in-person evaluation if the symptoms worsen or if the condition fails to improve as anticipated.  I provided 17 minutes of non-face-to-face time during this encounter.     Review of Systems  Constitutional: Negative for diaphoresis and fatigue.  HENT: Negative for congestion and rhinorrhea.   Respiratory: Negative for cough and shortness of  breath.   Cardiovascular: Negative for chest pain and leg swelling.  Gastrointestinal: Negative for abdominal pain and diarrhea.  Skin: Negative for color change and rash.  Neurological: Negative for dizziness and headaches.  Psychiatric/Behavioral: Negative for behavioral problems and confusion.       Objective:   Physical Exam  Today's visit was via telephone Physical exam was not possible for this visit      Assessment & Plan:  HTN decent control continue current medication watch diet stay active Tobacco abuse patient has stayed away from smoking which is great Reflux and irritable bowel under good control current medications Diabetes under good control with current medication Hyperlipidemia under good control continue current measures Follow-up in the summer for in person visit I encourage the patient do a colonoscopy Patient will think about it Follow-up in the summer lab work by June

## 2019-10-04 ENCOUNTER — Encounter: Payer: Self-pay | Admitting: Family Medicine

## 2019-10-05 ENCOUNTER — Other Ambulatory Visit: Payer: Self-pay | Admitting: Family Medicine

## 2019-10-05 MED ORDER — DICYCLOMINE HCL 20 MG PO TABS: ORAL_TABLET | ORAL | 5 refills | Status: DC

## 2019-10-23 ENCOUNTER — Telehealth: Payer: Self-pay | Admitting: Family Medicine

## 2019-10-23 MED ORDER — PRAVASTATIN SODIUM 20 MG PO TABS: ORAL_TABLET | ORAL | 1 refills | Status: DC

## 2019-10-23 NOTE — Telephone Encounter (Signed)
Patient having a problem at the pharmacy because Dr. Lorin Picket increased his meds from taking 1/2 to taking a whole daily.  But the prescription still say 1/2 so he can't get a refill.  They need it corrected and called in to pharmacy.  pravastatin (PRAVACHOL) 20 MG tablet   [facility] [address]

## 2019-10-23 NOTE — Telephone Encounter (Signed)
Medication sent to pharmacy and pt is aware 

## 2019-10-23 NOTE — Telephone Encounter (Signed)
Pravastatin 20 mg,#90, 1 refill, 1qd

## 2019-10-23 NOTE — Telephone Encounter (Signed)
Please advise. Thank you

## 2019-12-16 ENCOUNTER — Encounter: Payer: Self-pay | Admitting: Family Medicine

## 2019-12-16 DIAGNOSIS — E785 Hyperlipidemia, unspecified: Secondary | ICD-10-CM

## 2019-12-16 DIAGNOSIS — M1000 Idiopathic gout, unspecified site: Secondary | ICD-10-CM

## 2019-12-16 DIAGNOSIS — I10 Essential (primary) hypertension: Secondary | ICD-10-CM

## 2019-12-16 DIAGNOSIS — E119 Type 2 diabetes mellitus without complications: Secondary | ICD-10-CM

## 2019-12-18 ENCOUNTER — Other Ambulatory Visit: Payer: Self-pay | Admitting: Family Medicine

## 2019-12-18 MED ORDER — ALLOPURINOL 300 MG PO TABS: 300.0000 mg | ORAL_TABLET | Freq: Every day | ORAL | 0 refills | Status: DC

## 2019-12-18 NOTE — Telephone Encounter (Signed)
Nurses May have allopurinol for 90 days A1c, lipid, liver, metabolic 7, uric acid please print order sent to the patient he can do these labs in June Patient can do a follow-up visit in June

## 2019-12-18 NOTE — Addendum Note (Signed)
Addended by: Marlowe Shores on: 12/18/2019 10:57 AM   Modules accepted: Orders

## 2020-02-02 ENCOUNTER — Other Ambulatory Visit: Payer: Self-pay | Admitting: *Deleted

## 2020-02-02 ENCOUNTER — Telehealth: Payer: Self-pay | Admitting: Family Medicine

## 2020-02-02 MED ORDER — ALPRAZOLAM 0.5 MG PO TABS: ORAL_TABLET | ORAL | 2 refills | Status: DC

## 2020-02-02 NOTE — Telephone Encounter (Signed)
May refill x3

## 2020-02-02 NOTE — Telephone Encounter (Signed)
Rx faxed

## 2020-02-02 NOTE — Telephone Encounter (Signed)
[facility] [address] Dr. Herold Harms refill on Alprazolam 0.5 mg tablets. Take 1/2-1 tablet po every night at bedtime prn. Pt last seen 09/27/19 for HTN. Please advise. Thank you.

## 2020-02-20 LAB — BASIC METABOLIC PANEL
BUN/Creatinine Ratio: 13 (ref 9–20)
BUN: 14 mg/dL (ref 6–24)
CO2: 21 mmol/L (ref 20–29)
Calcium: 9.6 mg/dL (ref 8.7–10.2)
Chloride: 102 mmol/L (ref 96–106)
Creatinine, Ser: 1.04 mg/dL (ref 0.76–1.27)
GFR calc Af Amer: 95 mL/min/{1.73_m2} (ref >59)
GFR calc non Af Amer: 82 mL/min/{1.73_m2} (ref >59)
Glucose: 130 mg/dL — ABNORMAL HIGH (ref 65–99)
Potassium: 3.9 mmol/L (ref 3.5–5.2)
Sodium: 141 mmol/L (ref 134–144)

## 2020-02-20 LAB — HEPATIC FUNCTION PANEL
ALT: 34 IU/L (ref 0–44)
AST: 31 IU/L (ref 0–40)
Albumin: 4.6 g/dL (ref 3.8–4.9)
Alkaline Phosphatase: 56 IU/L (ref 48–121)
Bilirubin Total: 0.4 mg/dL (ref 0.0–1.2)
Bilirubin, Direct: 0.16 mg/dL (ref 0.00–0.40)
Total Protein: 7.6 g/dL (ref 6.0–8.5)

## 2020-02-20 LAB — LIPID PANEL
Chol/HDL Ratio: 4.1 ratio (ref 0.0–5.0)
Cholesterol, Total: 140 mg/dL (ref 100–199)
HDL: 34 mg/dL — ABNORMAL LOW (ref >39)
LDL Chol Calc (NIH): 67 mg/dL (ref 0–99)
Triglycerides: 242 mg/dL — ABNORMAL HIGH (ref 0–149)
VLDL Cholesterol Cal: 39 mg/dL (ref 5–40)

## 2020-02-20 LAB — URIC ACID: Uric Acid: 3.3 mg/dL — ABNORMAL LOW (ref 3.8–8.4)

## 2020-02-20 LAB — HEMOGLOBIN A1C
Est. average glucose Bld gHb Est-mCnc: 117 mg/dL
Hgb A1c MFr Bld: 5.7 % — ABNORMAL HIGH (ref 4.8–5.6)

## 2020-03-18 ENCOUNTER — Other Ambulatory Visit: Payer: Self-pay | Admitting: Family Medicine

## 2020-03-19 NOTE — Telephone Encounter (Signed)
90-day refill only patient needs office visit

## 2020-03-20 ENCOUNTER — Telehealth: Payer: Self-pay | Admitting: Family Medicine

## 2020-03-20 NOTE — Telephone Encounter (Signed)
Pt needing refill on Allopurinol. Refill request for Allopurinol and Metformin received. Sent in 90 day supply. Pt aware

## 2020-03-20 NOTE — Telephone Encounter (Signed)
Patient calling and checking on refill request that was sent by the pharmacy.  He is out of medications. Patient scheduled an appt for Friday.

## 2020-03-22 ENCOUNTER — Encounter: Payer: Self-pay | Admitting: Family Medicine

## 2020-03-22 ENCOUNTER — Other Ambulatory Visit: Payer: Self-pay

## 2020-03-22 ENCOUNTER — Ambulatory Visit (INDEPENDENT_AMBULATORY_CARE_PROVIDER_SITE_OTHER): Payer: 59 | Admitting: Family Medicine

## 2020-03-22 VITALS — BP 122/78 | Temp 97.2°F | Wt 172.2 lb

## 2020-03-22 DIAGNOSIS — M545 Low back pain, unspecified: Secondary | ICD-10-CM

## 2020-03-22 DIAGNOSIS — F321 Major depressive disorder, single episode, moderate: Secondary | ICD-10-CM

## 2020-03-22 DIAGNOSIS — F439 Reaction to severe stress, unspecified: Secondary | ICD-10-CM

## 2020-03-22 DIAGNOSIS — I10 Essential (primary) hypertension: Secondary | ICD-10-CM

## 2020-03-22 MED ORDER — SERTRALINE HCL 50 MG PO TABS: 50.0000 mg | ORAL_TABLET | Freq: Every day | ORAL | 3 refills | Status: DC

## 2020-03-22 NOTE — Progress Notes (Signed)
   Subjective:    Patient ID: Gregory Thompson, male    DOB: Jan 21, 1966, 54 y.o.   MRN: [id number]  HPI  Patient arrives for a follow up on anxiety. Patient states he is under a tremendous amount of stress taking car of his mom. Patient having take care of his mom who has dementia plus also some other underlying anger issues and psychiatric issues she is under the care of psychiatry but rapidly she is getting to the point where she needs to be in a assisted living but it does not look like family will be able to do that anytime soon Review of Systems     Objective:   Physical Exam  Patient finds himself depressed anxious and nervous denies being suicidal. Patient also with some lower back pain and a pulled muscle in the lower abdomen requesting a few days to allow himself to rest. Subjective discomfort in the low back and also pain in the lower abdomen with sitting up. Stretching exercises were shown.     Assessment & Plan:  Work excuse given for the next week due to stress and depression Patient also has low back pain lower abdominal flank pain from a pulled muscle should gradually get better we will hold off on work-up for now Sertraline 50 mg daily Patient to give [place] an update via MyChart within 2 weeks how he is doing Follow-up in 8 weeks Warning signs were discussed Patient not suicidal Under a lot of stress because his mom has severe dementia with behavioral issues. Labs reviewed

## 2020-03-30 ENCOUNTER — Other Ambulatory Visit: Payer: Self-pay | Admitting: Family Medicine

## 2020-04-05 ENCOUNTER — Encounter: Payer: Self-pay | Admitting: Family Medicine

## 2020-04-05 NOTE — Telephone Encounter (Signed)
Sent to pt.

## 2020-04-05 NOTE — Telephone Encounter (Signed)
Please go ahead and give a work note for today Saturday Sunday with return to work on Monday Front-please send via [employer]

## 2020-04-09 ENCOUNTER — Telehealth: Payer: Self-pay | Admitting: Family Medicine

## 2020-04-09 ENCOUNTER — Encounter: Payer: Self-pay | Admitting: Family Medicine

## 2020-04-09 NOTE — Telephone Encounter (Signed)
Work excuse up front for pickup. 

## 2020-04-09 NOTE — Telephone Encounter (Signed)
Please give patient a work excuse for this week If he has a FMLA we can also fill that out

## 2020-04-09 NOTE — Telephone Encounter (Signed)
Patient is requesting a work note for this week  because of his mother's mental issues going on. He is trying to get her placed in a care home. His mom is doctor Dr. Ladona Ridgel patient. Please Advise

## 2020-04-18 ENCOUNTER — Other Ambulatory Visit: Payer: Self-pay | Admitting: Family Medicine

## 2020-04-26 ENCOUNTER — Encounter: Payer: Self-pay | Admitting: Family Medicine

## 2020-05-02 ENCOUNTER — Encounter: Payer: Self-pay | Admitting: Family Medicine

## 2020-05-02 NOTE — Telephone Encounter (Signed)
Nurses I am not opposed to the patient having some time off If the patient needs a note for this week and next week this can be done-if he desires to have a note for this week and next week please go ahead and give this  I would recommend same-day office visit to be done somewhere within the next 10 days for Gregory Thompson to see me.  Of note if patient does have any worrisome chest pressure tightness or pain he may need to go to urgent care or ER right away if this occurs before his office visit.

## 2020-05-20 ENCOUNTER — Ambulatory Visit (INDEPENDENT_AMBULATORY_CARE_PROVIDER_SITE_OTHER): Payer: 59 | Admitting: Family Medicine

## 2020-05-20 ENCOUNTER — Other Ambulatory Visit: Payer: Self-pay

## 2020-05-20 ENCOUNTER — Encounter: Payer: Self-pay | Admitting: Family Medicine

## 2020-05-20 VITALS — BP 138/86 | Temp 97.1°F | Wt 181.2 lb

## 2020-05-20 DIAGNOSIS — E785 Hyperlipidemia, unspecified: Secondary | ICD-10-CM

## 2020-05-20 DIAGNOSIS — E119 Type 2 diabetes mellitus without complications: Secondary | ICD-10-CM

## 2020-05-20 DIAGNOSIS — I10 Essential (primary) hypertension: Secondary | ICD-10-CM | POA: Diagnosis not present

## 2020-05-20 DIAGNOSIS — Z720 Tobacco use: Secondary | ICD-10-CM | POA: Diagnosis not present

## 2020-05-20 MED ORDER — METFORMIN HCL ER 500 MG PO TB24: ORAL_TABLET | ORAL | 1 refills | Status: DC

## 2020-05-20 MED ORDER — SERTRALINE HCL 50 MG PO TABS: 50.0000 mg | ORAL_TABLET | Freq: Every day | ORAL | 3 refills | Status: DC

## 2020-05-20 MED ORDER — ALPRAZOLAM 0.5 MG PO TABS: ORAL_TABLET | ORAL | 2 refills | Status: DC

## 2020-05-20 MED ORDER — SILDENAFIL CITRATE 20 MG PO TABS: ORAL_TABLET | ORAL | 1 refills | Status: DC

## 2020-05-20 MED ORDER — ALLOPURINOL 300 MG PO TABS: ORAL_TABLET | ORAL | 1 refills | Status: DC

## 2020-05-20 MED ORDER — PRAVASTATIN SODIUM 20 MG PO TABS: ORAL_TABLET | ORAL | 1 refills | Status: DC

## 2020-05-20 MED ORDER — GLIPIZIDE ER 2.5 MG PO TB24: ORAL_TABLET | ORAL | 1 refills | Status: DC

## 2020-05-20 MED ORDER — GEMFIBROZIL 600 MG PO TABS: ORAL_TABLET | ORAL | 1 refills | Status: DC

## 2020-05-20 MED ORDER — LOSARTAN POTASSIUM 50 MG PO TABS: ORAL_TABLET | ORAL | 1 refills | Status: DC

## 2020-05-20 MED ORDER — PANTOPRAZOLE SODIUM 40 MG PO TBEC: 40.0000 mg | DELAYED_RELEASE_TABLET | Freq: Two times a day (BID) | ORAL | 1 refills | Status: DC

## 2020-05-20 NOTE — Progress Notes (Signed)
   Subjective:    Patient ID: Gregory Thompson, male    DOB: 1965-11-09, 54 y.o.   MRN: [id number]  HPI Pt here for follow up. Pt has since switched jobs. Pt states his situation has become more worse than better.  He denies being depressed.  He states anxiety is doing okay.  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  We did discuss how most shingles hits individuals more often in their 7s he has elected to hold off on the Shingrix shot until his mid 56s  Review of Systems  Constitutional: Negative for activity change, fatigue and fever.  HENT: Negative for congestion and rhinorrhea.   Respiratory: Negative for cough and shortness of breath.   Cardiovascular: Negative for chest pain and leg swelling.  Gastrointestinal: Negative for abdominal pain, diarrhea and nausea.  Genitourinary: Negative for dysuria and hematuria.  Neurological: Negative for weakness and headaches.  Psychiatric/Behavioral: Negative for agitation and behavioral problems.       Objective:   Physical Exam Vitals reviewed.  Cardiovascular:     Rate and Rhythm: Normal rate and regular rhythm.     Heart sounds: Normal heart sounds. No murmur heard.   Pulmonary:     Effort: Pulmonary effort is normal.     Breath sounds: Normal breath sounds.  Lymphadenopathy:     Cervical: No cervical adenopathy.  Neurological:     Mental Status: He is alert.  Psychiatric:        Behavior: Behavior normal.    He states his gout's been under good control taking his medicine as directed Uses Xanax but only at nighttime he is trying to cut back Uses either dicyclomine or hyoscyamine on a as needed basis for the IBS he does take his blood pressure medicine on a regular basis and his diabetes medicine.       Assessment & Plan:  1. Essential hypertension Blood pressure good control watch diet stay physically  active  2. Type 2 diabetes mellitus without complication, without long-term current use of insulin (HCC) Diabetes reportedly under good control on recent lab work we will work on exercise activity continue his medication  3. Hyperlipidemia, unspecified hyperlipidemia type Hyperlipidemia important continue his medication Comprehensive lab work in the spring  Patient was encouraged to quit smoking

## 2020-05-21 NOTE — Progress Notes (Signed)
Labs placed in reminder file with note to follow up one month after labs

## 2020-06-13 ENCOUNTER — Other Ambulatory Visit: Payer: Self-pay | Admitting: Family Medicine

## 2020-07-10 ENCOUNTER — Other Ambulatory Visit: Payer: Self-pay | Admitting: Family Medicine

## 2020-09-03 ENCOUNTER — Telehealth: Payer: Self-pay

## 2020-09-03 DIAGNOSIS — E119 Type 2 diabetes mellitus without complications: Secondary | ICD-10-CM

## 2020-09-03 DIAGNOSIS — E785 Hyperlipidemia, unspecified: Secondary | ICD-10-CM

## 2020-09-03 DIAGNOSIS — Z125 Encounter for screening for malignant neoplasm of prostate: Secondary | ICD-10-CM

## 2020-09-03 DIAGNOSIS — I10 Essential (primary) hypertension: Secondary | ICD-10-CM

## 2020-09-03 DIAGNOSIS — Z79899 Other long term (current) drug therapy: Secondary | ICD-10-CM

## 2020-09-03 NOTE — Telephone Encounter (Signed)
Pt needs blood work ordered Dr Lorin Picket had requested blood work. Pt said he could go get it done Thursday.  Pt call back [phone]

## 2020-09-03 NOTE — Telephone Encounter (Signed)
From Reminder file:   Lipid, Hepatic, Met 7, A1C, PSA in February with follow up office visit in March with Dr Scott 

## 2020-09-03 NOTE — Telephone Encounter (Signed)
Lab orders placed. Left message to return call  

## 2020-09-04 ENCOUNTER — Encounter: Payer: Self-pay | Admitting: *Deleted

## 2020-09-11 LAB — LIPID PANEL
Chol/HDL Ratio: 6.5 ratio — ABNORMAL HIGH (ref 0.0–5.0)
Cholesterol, Total: 200 mg/dL — ABNORMAL HIGH (ref 100–199)
HDL: 31 mg/dL — ABNORMAL LOW (ref >39)
LDL Chol Calc (NIH): 74 mg/dL (ref 0–99)
Triglycerides: 602 mg/dL (ref 0–149)
VLDL Cholesterol Cal: 95 mg/dL — ABNORMAL HIGH (ref 5–40)

## 2020-09-11 LAB — HEPATIC FUNCTION PANEL
ALT: 37 IU/L (ref 0–44)
AST: 27 IU/L (ref 0–40)
Albumin: 4.9 g/dL (ref 3.8–4.9)
Alkaline Phosphatase: 61 IU/L (ref 44–121)
Bilirubin Total: 0.4 mg/dL (ref 0.0–1.2)
Bilirubin, Direct: 0.14 mg/dL (ref 0.00–0.40)
Total Protein: 7.8 g/dL (ref 6.0–8.5)

## 2020-09-11 LAB — BASIC METABOLIC PANEL
BUN/Creatinine Ratio: 12 (ref 9–20)
BUN: 14 mg/dL (ref 6–24)
CO2: 22 mmol/L (ref 20–29)
Calcium: 10.2 mg/dL (ref 8.7–10.2)
Chloride: 96 mmol/L (ref 96–106)
Creatinine, Ser: 1.17 mg/dL (ref 0.76–1.27)
GFR calc Af Amer: 82 mL/min/{1.73_m2} (ref >59)
GFR calc non Af Amer: 71 mL/min/{1.73_m2} (ref >59)
Glucose: 171 mg/dL — ABNORMAL HIGH (ref 65–99)
Potassium: 3.9 mmol/L (ref 3.5–5.2)
Sodium: 139 mmol/L (ref 134–144)

## 2020-09-11 LAB — HEMOGLOBIN A1C
Est. average glucose Bld gHb Est-mCnc: 137 mg/dL
Hgb A1c MFr Bld: 6.4 % — ABNORMAL HIGH (ref 4.8–5.6)

## 2020-09-11 LAB — PSA: Prostate Specific Ag, Serum: 0.4 ng/mL (ref 0.0–4.0)

## 2020-09-17 ENCOUNTER — Other Ambulatory Visit: Payer: Self-pay | Admitting: Family Medicine

## 2020-09-20 ENCOUNTER — Other Ambulatory Visit: Payer: Self-pay | Admitting: *Deleted

## 2020-09-20 DIAGNOSIS — E785 Hyperlipidemia, unspecified: Secondary | ICD-10-CM

## 2020-09-20 DIAGNOSIS — I10 Essential (primary) hypertension: Secondary | ICD-10-CM

## 2020-09-20 DIAGNOSIS — Z125 Encounter for screening for malignant neoplasm of prostate: Secondary | ICD-10-CM

## 2020-09-20 DIAGNOSIS — E119 Type 2 diabetes mellitus without complications: Secondary | ICD-10-CM

## 2020-09-20 MED ORDER — METFORMIN HCL ER 500 MG PO TB24: ORAL_TABLET | ORAL | 1 refills | Status: DC

## 2020-11-11 ENCOUNTER — Other Ambulatory Visit: Payer: Self-pay | Admitting: Family Medicine

## 2020-11-22 ENCOUNTER — Encounter: Payer: Self-pay | Admitting: Family Medicine

## 2020-11-22 LAB — HM DIABETES EYE EXAM

## 2020-11-23 ENCOUNTER — Other Ambulatory Visit: Payer: Self-pay | Admitting: Family Medicine

## 2020-12-01 ENCOUNTER — Telehealth: Payer: Self-pay | Admitting: Family Medicine

## 2020-12-01 NOTE — Telephone Encounter (Signed)
Front Patient was going to be doing lab work later this spring with follow-up office visit/wellness visit.  He needs to do this either in May or June please schedule

## 2020-12-03 NOTE — Telephone Encounter (Signed)
Scheduled physical for 6/16

## 2020-12-10 ENCOUNTER — Other Ambulatory Visit: Payer: Self-pay | Admitting: Family Medicine

## 2020-12-11 ENCOUNTER — Other Ambulatory Visit: Payer: Self-pay | Admitting: Family Medicine

## 2020-12-11 MED ORDER — DICYCLOMINE HCL 20 MG PO TABS: ORAL_TABLET | ORAL | 5 refills | Status: DC

## 2020-12-11 NOTE — Telephone Encounter (Signed)
Please send him a MyChart message reminding him to do his labs before his follow-up visit.  They are in the system they were put in in January.  May have 6 months on this refill thank you

## 2020-12-16 ENCOUNTER — Other Ambulatory Visit: Payer: Self-pay | Admitting: Family Medicine

## 2020-12-23 ENCOUNTER — Other Ambulatory Visit: Payer: Self-pay

## 2020-12-23 ENCOUNTER — Encounter: Payer: Self-pay | Admitting: Family Medicine

## 2020-12-23 MED ORDER — PANTOPRAZOLE SODIUM 40 MG PO TBEC: 40.0000 mg | DELAYED_RELEASE_TABLET | Freq: Two times a day (BID) | ORAL | 0 refills | Status: DC

## 2021-01-21 ENCOUNTER — Other Ambulatory Visit: Payer: Self-pay | Admitting: *Deleted

## 2021-01-21 MED ORDER — LOSARTAN POTASSIUM 50 MG PO TABS: ORAL_TABLET | ORAL | 0 refills | Status: DC

## 2021-01-30 LAB — BASIC METABOLIC PANEL
BUN/Creatinine Ratio: 16 (ref 9–20)
BUN: 17 mg/dL (ref 6–24)
CO2: 21 mmol/L (ref 20–29)
Calcium: 9.8 mg/dL (ref 8.7–10.2)
Chloride: 99 mmol/L (ref 96–106)
Creatinine, Ser: 1.08 mg/dL (ref 0.76–1.27)
Glucose: 166 mg/dL — ABNORMAL HIGH (ref 65–99)
Potassium: 4.2 mmol/L (ref 3.5–5.2)
Sodium: 136 mmol/L (ref 134–144)
eGFR: 82 mL/min/{1.73_m2} (ref >59)

## 2021-01-30 LAB — LIPID PANEL
Chol/HDL Ratio: 5 ratio (ref 0.0–5.0)
Cholesterol, Total: 156 mg/dL (ref 100–199)
HDL: 31 mg/dL — ABNORMAL LOW (ref >39)
LDL Chol Calc (NIH): 66 mg/dL (ref 0–99)
Triglycerides: 379 mg/dL — ABNORMAL HIGH (ref 0–149)
VLDL Cholesterol Cal: 59 mg/dL — ABNORMAL HIGH (ref 5–40)

## 2021-01-30 LAB — HEMOGLOBIN A1C
Est. average glucose Bld gHb Est-mCnc: 163 mg/dL
Hgb A1c MFr Bld: 7.3 % — ABNORMAL HIGH (ref 4.8–5.6)

## 2021-01-30 LAB — PSA: Prostate Specific Ag, Serum: 0.5 ng/mL (ref 0.0–4.0)

## 2021-02-13 ENCOUNTER — Other Ambulatory Visit: Payer: Self-pay | Admitting: Family Medicine

## 2021-02-14 NOTE — Telephone Encounter (Signed)
Last seen 05/20/20. Has up coming appt this month

## 2021-02-20 ENCOUNTER — Other Ambulatory Visit: Payer: Self-pay

## 2021-02-20 ENCOUNTER — Ambulatory Visit (INDEPENDENT_AMBULATORY_CARE_PROVIDER_SITE_OTHER): Payer: Commercial Managed Care - HMO | Admitting: Family Medicine

## 2021-02-20 ENCOUNTER — Encounter: Payer: Self-pay | Admitting: Family Medicine

## 2021-02-20 VITALS — BP 128/86 | HR 111 | Temp 98.1°F | Ht 68.0 in | Wt 175.0 lb

## 2021-02-20 DIAGNOSIS — E785 Hyperlipidemia, unspecified: Secondary | ICD-10-CM

## 2021-02-20 DIAGNOSIS — I10 Essential (primary) hypertension: Secondary | ICD-10-CM

## 2021-02-20 DIAGNOSIS — Z1211 Encounter for screening for malignant neoplasm of colon: Secondary | ICD-10-CM

## 2021-02-20 DIAGNOSIS — Z0000 Encounter for general adult medical examination without abnormal findings: Secondary | ICD-10-CM

## 2021-02-20 DIAGNOSIS — M1000 Idiopathic gout, unspecified site: Secondary | ICD-10-CM

## 2021-02-20 DIAGNOSIS — E119 Type 2 diabetes mellitus without complications: Secondary | ICD-10-CM

## 2021-02-20 DIAGNOSIS — G4700 Insomnia, unspecified: Secondary | ICD-10-CM

## 2021-02-20 MED ORDER — ALPRAZOLAM 0.5 MG PO TABS: 0.2500 mg | ORAL_TABLET | Freq: Every evening | ORAL | 4 refills | Status: DC | PRN

## 2021-02-20 MED ORDER — EMPAGLIFLOZIN 10 MG PO TABS: 10.0000 mg | ORAL_TABLET | Freq: Every day | ORAL | 5 refills | Status: DC

## 2021-02-20 MED ORDER — DAPAGLIFLOZIN PROPANEDIOL 5 MG PO TABS: 5.0000 mg | ORAL_TABLET | Freq: Every day | ORAL | 1 refills | Status: DC

## 2021-02-20 MED ORDER — METFORMIN HCL ER 500 MG PO TB24: ORAL_TABLET | ORAL | 1 refills | Status: DC

## 2021-02-20 MED ORDER — GEMFIBROZIL 600 MG PO TABS: ORAL_TABLET | ORAL | 1 refills | Status: DC

## 2021-02-20 NOTE — Progress Notes (Signed)
   Subjective:    Patient ID: Gregory Thompson, male    DOB: Jun 19, 1966, 55 y.o.   MRN: [id number]  HPI  The patient comes in today for a wellness visit.    A review of their health history was completed.  A review of medications was also completed.  Any needed refills; alprazolam  Eating habits: fair  Falls/  MVA accidents in past few months: none  Regular exercise: yard work  [employer] clerk pt sees on regular basis: none  Preventative health issues were discussed.   Additional concerns: none  Review of Systems     Objective:   Physical Exam  General-in no acute distress Eyes-no discharge Lungs-respiratory rate normal, CTA CV-no murmurs,RRR Extremities skin warm dry no edema Neuro grossly normal Behavior normal, alert Patient defers on prostate exam      Assessment & Plan:   Patient prefers to get colonoscopy later this fall  1. Essential hypertension Blood pressure good control continue current measures - Hemoglobin A1c - Basic metabolic panel - Uric acid - Lipid panel - gemfibrozil (LOPID) 600 MG tablet; TAKE 1 TABLET BY MOUTH EVERY OTHER DAY  Dispense: 45 tablet; Refill: 1  2. Type 2 diabetes mellitus without complication, without long-term current use of insulin (HCC) Diabetes needs to be under better control to lessen long-term complications we discussed various options he would like to try Jardiance side effects was discussed proper hydration discussed call us if any problems 10 mg daily stop glipizide Due to diarrhea issues reduce metformin to 1/day - Hemoglobin A1c - Basic metabolic panel - Uric acid - Lipid panel - gemfibrozil (LOPID) 600 MG tablet; TAKE 1 TABLET BY MOUTH EVERY OTHER DAY  Dispense: 45 tablet; Refill: 1 - metFORMIN (GLUCOPHAGE-XR) 500 MG 24 hr tablet; Take 1 tablet by mouth daily  Dispense: 90 tablet; Refill: 1  3. Hyperlipidemia, unspecified hyperlipidemia type Hyperlipidemia continue medication watch diet - Hemoglobin A1c -  Basic metabolic panel - Uric acid - Lipid panel - gemfibrozil (LOPID) 600 MG tablet; TAKE 1 TABLET BY MOUTH EVERY OTHER DAY  Dispense: 45 tablet; Refill: 1  4. Idiopathic gout, unspecified chronicity, unspecified site No gout flareups currently check lab work before next visit in the fall - Hemoglobin A1c - Basic metabolic panel - Uric acid - Lipid panel - gemfibrozil (LOPID) 600 MG tablet; TAKE 1 TABLET BY MOUTH EVERY OTHER DAY  Dispense: 45 tablet; Refill: 1  5. Colon cancer screening Colonoscopy recommended - Ambulatory referral to Gastroenterology  6. Well adult exam Adult wellness-complete.wellness physical was conducted today. Importance of diet and exercise were discussed in detail.  In addition to this a discussion regarding safety was also covered. We also reviewed over immunizations and gave recommendations regarding current immunization needed for age.  In addition to this additional areas were also touched on including: Preventative health exams needed:  Colonoscopy patient prefers colonoscopy late this fall  Patient was advised yearly wellness exam   7. Insomnia, unspecified type Xanax at nighttime he has been on this for years

## 2021-02-21 ENCOUNTER — Encounter: Payer: Self-pay | Admitting: Family Medicine

## 2021-02-21 NOTE — Telephone Encounter (Signed)
Note states to stop glipizide. Should pt continue til he gets new med. He said refill was sent in but I dont see it on med list.

## 2021-02-23 ENCOUNTER — Encounter: Payer: Self-pay | Admitting: Family Medicine

## 2021-02-25 ENCOUNTER — Other Ambulatory Visit: Payer: Self-pay

## 2021-02-25 DIAGNOSIS — E119 Type 2 diabetes mellitus without complications: Secondary | ICD-10-CM

## 2021-02-25 MED ORDER — BLOOD GLUCOSE MONITORING SUPPL MISC: 11 refills | Status: DC

## 2021-03-03 ENCOUNTER — Other Ambulatory Visit: Payer: Self-pay | Admitting: *Deleted

## 2021-03-03 MED ORDER — SERTRALINE HCL 50 MG PO TABS: ORAL_TABLET | ORAL | 0 refills | Status: DC

## 2021-03-04 ENCOUNTER — Telehealth: Payer: Self-pay | Admitting: Family Medicine

## 2021-03-04 ENCOUNTER — Encounter: Payer: Self-pay | Admitting: Family Medicine

## 2021-03-04 NOTE — Telephone Encounter (Signed)
Patient wanting prescription for jardiance 25 mg called into [facility] [address] and new meter with supplies

## 2021-03-04 NOTE — Telephone Encounter (Signed)
Please see telephone message 

## 2021-03-04 NOTE — Telephone Encounter (Signed)
Nurses Patient may have updated meter and prescription for strips Testing is once daily based upon his level of type 2 diabetes without insulin  His glucose reading numbers are elevated. I would also recommend the patient check some days sugar readings first thing in the morning fasting and other days check it closer to suppertime  There is no need to check the sugars more than once per day  He has options regarding his elevated glucose Option #1 we can increase Jardiance to 25 mg each day instead of 10 mg  Option #2 we can increase metformin extended release 500 mg.  Instead of once per day the dosage would be twice per day.  Please see which option he would like to try as his next step.  Also to remember to do blood work and follow-up office visit in October.  Blood work has already been ordered just needs to do it several days before his October visit.  Thanks-Dr. Lorin Picket

## 2021-03-04 NOTE — Telephone Encounter (Signed)
Please advise. Script for meter and supplies on door for signature. Thank you

## 2021-03-05 MED ORDER — EMPAGLIFLOZIN 25 MG PO TABS: ORAL_TABLET | ORAL | 1 refills | Status: DC

## 2021-03-05 NOTE — Telephone Encounter (Signed)
Script faxed to pharmacy, medication sent to pharmacy with note "change in dose" and pt is aware. Pt verbalized understanding

## 2021-03-05 NOTE — Telephone Encounter (Signed)
Signature is completed  May increase Jardiance new dose to 25 mg 1 daily, #90, 1 refill cancel the 10 mg If progressive problems or issues to notify us  Send [place] some readings within 1 month  Do lab work and follow-up office visit in October

## 2021-03-10 ENCOUNTER — Other Ambulatory Visit: Payer: Self-pay | Admitting: Family Medicine

## 2021-03-11 ENCOUNTER — Encounter: Payer: Self-pay | Admitting: Family Medicine

## 2021-03-11 ENCOUNTER — Other Ambulatory Visit: Payer: Self-pay | Admitting: *Deleted

## 2021-03-11 NOTE — Addendum Note (Signed)
Addended by: Marlowe Shores on: 03/11/2021 05:13 PM   Modules accepted: Orders

## 2021-03-11 NOTE — Telephone Encounter (Signed)
90-day with 1 refill on all scripts

## 2021-03-11 NOTE — Telephone Encounter (Signed)
HyCosamin 0.125mg    1 taken 3 times daily as needed, #90, 3 refills  If he prefers 90-day it would be 270 tablets with 1 refill

## 2021-03-12 MED ORDER — HYOSCYAMINE SULFATE 0.125 MG PO TABS: ORAL_TABLET | ORAL | 1 refills | Status: DC

## 2021-03-12 NOTE — Telephone Encounter (Signed)
Pt states he got a 30 supply of the extended release so he is good for 30 days. I'm not sure which one he needs to be one. 0.375 12 hour table was sent in on 7/5 with directions one bid and 0.125mg  was sent in on 7/6 one tid. Read through phone message and talked with pt and he is taking 0.375mg  one tid #270 and wants extended release. Is this ok to change and cancel the two scripts that was sent in on 7/5 and 7/6

## 2021-03-12 NOTE — Addendum Note (Signed)
Addended by: Marlowe Shores on: 03/12/2021 08:21 AM   Modules accepted: Orders

## 2021-03-12 NOTE — Telephone Encounter (Signed)
Pt calling in voiced that  Is needing extend release. 0.375 mg  Pt would like a call back

## 2021-03-13 ENCOUNTER — Encounter: Payer: Self-pay | Admitting: Family Medicine

## 2021-03-18 NOTE — Telephone Encounter (Signed)
Pt states he got a 30 supply of the extended release so he is good for 30 days. I'm not sure which one he needs to be one. 0.375 12 hour table was sent in on 7/5 with directions one bid and 0.125mg  was sent in on 7/6 one tid. Read through phone message and talked with pt and he is taking 0.375mg  one tid #270 and wants extended release. Is this ok to change and cancel the two scripts that was sent in on 7/5 and 7/6.  See pt's message below.after talking with him he sent the message below. he said he is taking extended release one bid not tid. Ok to change to how pt states he is taking?

## 2021-03-20 ENCOUNTER — Other Ambulatory Visit: Payer: Self-pay | Admitting: Family Medicine

## 2021-03-20 NOTE — Telephone Encounter (Signed)
Nurses I reviewed over his medication list Levbid 0.3751 twice daily has already been prescribed, #180, with refills  Short acting 0.125 was also prescribed  I do not think he needs any additional prescriptions If this changes please let me know thank you-Dr. Lorin Picket

## 2021-04-01 ENCOUNTER — Other Ambulatory Visit: Payer: Self-pay | Admitting: Family Medicine

## 2021-04-25 ENCOUNTER — Telehealth: Payer: Self-pay | Admitting: Family Medicine

## 2021-04-25 MED ORDER — LOSARTAN POTASSIUM 50 MG PO TABS: ORAL_TABLET | ORAL | 1 refills | Status: DC

## 2021-04-25 NOTE — Telephone Encounter (Signed)
Prescription sent electronically to pharmacy  Left message to return call 

## 2021-04-25 NOTE — Telephone Encounter (Signed)
Patient is needing refill on losartan 50 mg called into [facility] [address]

## 2021-04-30 NOTE — Telephone Encounter (Signed)
Pharmacy contacted and states pt picked up script on 04/28/21

## 2021-05-15 ENCOUNTER — Ambulatory Visit: Payer: 59 | Admitting: Family Medicine

## 2021-05-15 ENCOUNTER — Encounter: Payer: Self-pay | Admitting: Family Medicine

## 2021-05-15 ENCOUNTER — Telehealth: Payer: Self-pay | Admitting: Family Medicine

## 2021-05-15 ENCOUNTER — Other Ambulatory Visit: Payer: Self-pay

## 2021-05-15 VITALS — BP 114/77 | HR 109 | Temp 97.5°F | Wt 175.2 lb

## 2021-05-15 DIAGNOSIS — M62830 Muscle spasm of back: Secondary | ICD-10-CM | POA: Diagnosis not present

## 2021-05-15 MED ORDER — METHOCARBAMOL 750 MG PO TABS: 750.0000 mg | ORAL_TABLET | Freq: Three times a day (TID) | ORAL | 0 refills | Status: DC

## 2021-05-15 NOTE — Progress Notes (Signed)
Patient ID: Gregory Thompson, male    DOB: 1965/12/05, 55 y.o.   MRN: [id number]   Chief Complaint  Patient presents with   Back Pain   Subjective:    HPI Pt having back pain in lower back; through out the day pain moves along spine and into shoulders and neck. 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 Going on about 3 weeks. Pt has been taking Advil with Tylenol. Advil has caused issues with stomach; pt has IBS.  Starting lower in day, then goes up the back to neck and shoulders.  Driving a dump truck, bouncing a lot and moving furniture. - Advil having h/o ibs- has more lose stools. No blood in stool.  No incontinence, or dysuria.  No numbness or tingling in legs.  H/o sciatica on rt in last.  No saddle anesthesia.  Medical History Gregory Thompson has a past medical history of Complication of anesthesia, Fatty liver, Gout, gallstones, Hypertension, Hypertriglyceridemia, Kidney stone, Liver cirrhosis (HCC) (1997), and Prediabetes.   Outpatient Encounter Medications as of 05/15/2021  Medication Sig   allopurinol (ZYLOPRIM) 300 MG tablet TAKE 1 TABLET(300 MG) BY MOUTH DAILY   ALPRAZolam (XANAX) 0.5 MG tablet Take 0.5-1 tablets (0.25-0.5 mg total) by mouth at bedtime as needed.   Blood Glucose Monitoring Suppl MISC Glucose testing supplies-test once a day   CINNAMON PO Take 2,000 mg by mouth daily. 2,000 mg    empagliflozin (JARDIANCE) 25 MG TABS tablet Take one tablet po daily   gemfibrozil (LOPID) 600 MG tablet TAKE 1 TABLET BY MOUTH EVERY OTHER DAY   hyoscyamine (LEVBID) 0.375 MG 12 hr tablet TAKE 1 TABLET BY MOUTH EVERY 12 HOURS AS NEEDED   hyoscyamine (LEVSIN) 0.125 MG tablet Take one tablet po TID prn   losartan (COZAAR) 50 MG tablet TAKE 1 TABLET(50 MG) BY MOUTH DAILY   metFORMIN (GLUCOPHAGE-XR) 500 MG 24 hr tablet Take 1 tablet by mouth daily   Omega-3 Fatty Acids (OMEGA-3 FISH OIL PO) Take by mouth.   ONE TOUCH ULTRA TEST test strip  TEST ONCE DAILY AS DIRECTED   pantoprazole (PROTONIX) 40 MG tablet TAKE 1 TABLET(40 MG) BY MOUTH TWICE DAILY   pravastatin (PRAVACHOL) 20 MG tablet TAKE 1 TABLET BY MOUTH DAILY   Probiotic Product (PROBIOTIC FORMULA PO) Take 1 capsule by mouth daily. PHILLIPS   sertraline (ZOLOFT) 50 MG tablet TAKE 1 TABLET(50 MG) BY MOUTH DAILY   sildenafil (REVATIO) 20 MG tablet 2 to 5 before relations   methocarbamol (ROBAXIN) 750 MG tablet Take 1 tablet (750 mg total) by mouth 3 (three) times daily. Prn muscle spasms   No facility-administered encounter medications on file as of 05/15/2021.     Review of Systems  Constitutional:  Negative for chills and fever.  HENT:  Negative for congestion, rhinorrhea and sore throat.   Respiratory:  Negative for cough, shortness of breath and wheezing.   Cardiovascular:  Negative for chest pain and leg swelling.  Gastrointestinal:  Negative for abdominal pain, diarrhea, nausea, rectal pain and vomiting.  Genitourinary:  Negative for difficulty urinating, dysuria, flank pain, frequency, hematuria and urgency.  Musculoskeletal:  Positive for back pain and neck pain.  Skin:  Negative for rash.  Neurological:  Negative for dizziness, weakness and headaches.    Vitals BP 114/77   Pulse (!) 109   Temp (!) 97.5 F (36.4 C)   Wt 175 lb 3.2 oz (79.5 kg)  SpO2 97%   BMI 26.64 kg/m   Objective:   Physical Exam Musculoskeletal:        General: Tenderness (bilateral ttp over trazpezius) present. No swelling.     Comments: + no pain over spinous process on lumbar and thoracic.  No rash, erythema or warmth. Normal rom of back.  Slr bilat. Neg-   Skin:    General: Skin is warm and dry.     Findings: No rash.    Assessment and Plan   1. Spasm of muscle of lower back   Heat/ice, exercises, take ibuprofen prn and take robaxin prn. Don't work while on robaxin.  Call or rto if worsening.  Return if symptoms worsen or fail to improve.

## 2021-05-15 NOTE — Telephone Encounter (Signed)
Pls call pt to see if wanting to see counselor or referral to psychiatry for concern of depression, since had a high depression score on his appt with me for "back pain."  Pls have pt follow up with pcp if continuing to have depression or worsening symptoms.   Thanks,   Dr. Ladona Ridgel

## 2021-05-15 NOTE — Telephone Encounter (Signed)
Ok great thank you!   Dr Karie Schwalbe

## 2021-05-15 NOTE — Telephone Encounter (Signed)
Left message to return call 

## 2021-05-15 NOTE — Telephone Encounter (Signed)
Patient states Dr Lorin Picket is aware and treating him for his depression and he has a follow up with Dr Lorin Picket in October for a recheck. Patient states he is not having feelings of harming himself or others and was advised to go straight to ER if depression worsen or he was having feelings of harming self or others. Patient verbalized understanding and declined referral.

## 2021-05-17 ENCOUNTER — Other Ambulatory Visit: Payer: Self-pay | Admitting: Family Medicine

## 2021-05-20 ENCOUNTER — Encounter: Payer: Self-pay | Admitting: Family Medicine

## 2021-05-21 ENCOUNTER — Encounter: Payer: Self-pay | Admitting: Family Medicine

## 2021-05-21 ENCOUNTER — Telehealth: Payer: Self-pay | Admitting: Family Medicine

## 2021-05-21 NOTE — Telephone Encounter (Signed)
Please advise. Thank you

## 2021-05-21 NOTE — Telephone Encounter (Signed)
Work note printed and up front for pickup.

## 2021-05-21 NOTE — Telephone Encounter (Signed)
1.  He may have a work excuse for this week 2.  Muscle relaxers really do not do much for back pain. If the person is having a lot of back spasms it is okay to utilize medicine but it typically causes a lot of drowsiness Most of the time I either avoid these medicines are only used some near bedtime During the day I recommend stretching exercises I also recommend short term use 5 to 7 days of anti-inflammatory such as ibuprofen or Aleve if his stomach can tolerate it If the back is not getting significantly better by next week more than likely will need a follow-up

## 2021-05-21 NOTE — Telephone Encounter (Signed)
Nurses-please see phone message.  Please make sure patient is aware of what is stated in the phone message thank you

## 2021-05-21 NOTE — Telephone Encounter (Signed)
Patient was seen 9/8 and given prescription for  methocarbamol 750 mg for muscle spasms but cant take and work. He states makes him sleepy so he is needing work excuse from 9/15-9/19 to take medication to help his back and return to work on 9/20. He states sent my chart message on 9/13. Please advise

## 2021-05-21 NOTE — Telephone Encounter (Signed)
Pt contacted and verbalized understanding. Please see telephone message

## 2021-05-21 NOTE — Telephone Encounter (Signed)
Pt contacted and verbalized understanding. Pt states back is worsening and getting tight. Ibuprofen is beginning to mess with patient stomach. Pt placed on schedule for Monday 05/26/21 at 3:50 p.

## 2021-05-26 ENCOUNTER — Other Ambulatory Visit: Payer: Self-pay

## 2021-05-26 ENCOUNTER — Encounter: Payer: Self-pay | Admitting: Family Medicine

## 2021-05-26 ENCOUNTER — Ambulatory Visit: Payer: 59 | Admitting: Family Medicine

## 2021-05-26 VITALS — BP 117/83 | HR 95 | Temp 98.1°F | Wt 176.8 lb

## 2021-05-26 DIAGNOSIS — M549 Dorsalgia, unspecified: Secondary | ICD-10-CM

## 2021-05-26 DIAGNOSIS — E119 Type 2 diabetes mellitus without complications: Secondary | ICD-10-CM

## 2021-05-26 MED ORDER — GLIPIZIDE ER 2.5 MG PO TB24: ORAL_TABLET | ORAL | 5 refills | Status: DC

## 2021-05-26 MED ORDER — METFORMIN HCL ER 500 MG PO TB24: ORAL_TABLET | ORAL | 5 refills | Status: DC

## 2021-05-26 NOTE — Progress Notes (Signed)
   Subjective:    Patient ID: Gregory Thompson, male    DOB: 29-Jun-1966, 55 y.o.   MRN: [id number]  HPI Pt having issues with back pain that is radiating to right shoulder and neck. Pt did see Dr.Taylor on 05/15/21. Pt requesting to be taken out of work longer due to guardianship issues and back issues. Pt did not begin Robaxin.  Severe pain that radiates into the right trapezius into the shoulder but not down the arm.  Denies numbness or tingling.  Denies any chest congestion tightness pain coughing vomiting or fever.  Under a lot of stress with his mom being Rest home and trying to try to sell her home Review of Systems     Objective:   Physical Exam  Lungs clear heart regular pulse normal extremities no edema      Assessment & Plan:  1. Type 2 diabetes mellitus without complication, without long-term current use of insulin (HCC) Cannot afford Jardiance.  Recommend low-dose glipizide 2.5 mg XL if any low sugars stop medicine notify us.  Increase metformin take 2 daily follow-up lab work by this fall and office visit - metFORMIN (GLUCOPHAGE-XR) 500 MG 24 hr tablet; Take 2 tablets by mouth daily  Dispense: 60 tablet; Refill: 5  2. Upper back pain Anti-inflammatories besides stretches work excuse for this week if not doing dramatically better after week notify us we may need to do physical therapy no x-rays or MRI indicated  Stress along with depression Taking Zoloft having situational stress because his mother is in assisted living they have to sell the house to find her stay causing significant stress for the patient not suicidal.  Follow-up by late fall sooner if any problems if progressive depression symptoms follow-up right away patient does not feel Zoloft needs to be increased currently

## 2021-05-30 ENCOUNTER — Encounter: Payer: Self-pay | Admitting: Family Medicine

## 2021-06-01 NOTE — Telephone Encounter (Signed)
Nurses-please add Jardiance 25 mg to his med list if not there already.  May have 30 or 90 tablets 63-month supply.  Patient to do lab work this fall as discussed before his follow-up visit.  Patient can give [place] update on both his sugars as well as stress levels in 3 to 4 weeks.  Follow-up office visit in October as planned  If any low sugars notify us we may need to stop glipizide thank you

## 2021-06-02 ENCOUNTER — Other Ambulatory Visit: Payer: Self-pay | Admitting: Family Medicine

## 2021-06-02 MED ORDER — EMPAGLIFLOZIN 25 MG PO TABS: ORAL_TABLET | ORAL | 1 refills | Status: DC

## 2021-06-02 NOTE — Addendum Note (Signed)
Addended by: Marlowe Shores on: 06/02/2021 08:24 AM   Modules accepted: Orders

## 2021-06-08 ENCOUNTER — Other Ambulatory Visit: Payer: Self-pay | Admitting: Family Medicine

## 2021-06-12 ENCOUNTER — Other Ambulatory Visit: Payer: Self-pay | Admitting: Family Medicine

## 2021-06-12 ENCOUNTER — Encounter: Payer: Self-pay | Admitting: Family Medicine

## 2021-06-18 ENCOUNTER — Encounter: Payer: Self-pay | Admitting: Family Medicine

## 2021-06-19 LAB — BASIC METABOLIC PANEL
BUN/Creatinine Ratio: 14 (ref 9–20)
BUN: 15 mg/dL (ref 6–24)
CO2: 21 mmol/L (ref 20–29)
Calcium: 10.2 mg/dL (ref 8.7–10.2)
Chloride: 98 mmol/L (ref 96–106)
Creatinine, Ser: 1.04 mg/dL (ref 0.76–1.27)
Glucose: 160 mg/dL — ABNORMAL HIGH (ref 70–99)
Potassium: 4.6 mmol/L (ref 3.5–5.2)
Sodium: 138 mmol/L (ref 134–144)
eGFR: 86 mL/min/{1.73_m2} (ref >59)

## 2021-06-19 LAB — LIPID PANEL
Chol/HDL Ratio: 7.1 ratio — ABNORMAL HIGH (ref 0.0–5.0)
Cholesterol, Total: 205 mg/dL — ABNORMAL HIGH (ref 100–199)
HDL: 29 mg/dL — ABNORMAL LOW (ref >39)
LDL Chol Calc (NIH): 64 mg/dL (ref 0–99)
Triglycerides: 733 mg/dL (ref 0–149)
VLDL Cholesterol Cal: 112 mg/dL — ABNORMAL HIGH (ref 5–40)

## 2021-06-19 LAB — HEMOGLOBIN A1C
Est. average glucose Bld gHb Est-mCnc: 169 mg/dL
Hgb A1c MFr Bld: 7.5 % — ABNORMAL HIGH (ref 4.8–5.6)

## 2021-06-19 LAB — URIC ACID: Uric Acid: 3.6 mg/dL — ABNORMAL LOW (ref 3.8–8.4)

## 2021-06-23 ENCOUNTER — Other Ambulatory Visit: Payer: Self-pay

## 2021-06-23 ENCOUNTER — Ambulatory Visit: Payer: Self-pay | Admitting: Family Medicine

## 2021-06-23 ENCOUNTER — Encounter: Payer: Self-pay | Admitting: Family Medicine

## 2021-06-23 DIAGNOSIS — I10 Essential (primary) hypertension: Secondary | ICD-10-CM

## 2021-06-23 DIAGNOSIS — E119 Type 2 diabetes mellitus without complications: Secondary | ICD-10-CM | POA: Diagnosis not present

## 2021-06-23 DIAGNOSIS — M1000 Idiopathic gout, unspecified site: Secondary | ICD-10-CM

## 2021-06-23 DIAGNOSIS — E785 Hyperlipidemia, unspecified: Secondary | ICD-10-CM

## 2021-06-23 MED ORDER — SERTRALINE HCL 50 MG PO TABS: ORAL_TABLET | ORAL | 5 refills | Status: DC

## 2021-06-23 MED ORDER — GEMFIBROZIL 600 MG PO TABS: ORAL_TABLET | ORAL | 1 refills | Status: DC

## 2021-06-23 MED ORDER — ALPRAZOLAM 0.5 MG PO TABS: 0.2500 mg | ORAL_TABLET | Freq: Every evening | ORAL | 4 refills | Status: DC | PRN

## 2021-06-23 MED ORDER — OMEGA-3-ACID ETHYL ESTERS 1 G PO CAPS: 2.0000 g | ORAL_CAPSULE | Freq: Two times a day (BID) | ORAL | 5 refills | Status: DC

## 2021-06-23 MED ORDER — PANTOPRAZOLE SODIUM 40 MG PO TBEC: DELAYED_RELEASE_TABLET | ORAL | 1 refills | Status: DC

## 2021-06-23 MED ORDER — METFORMIN HCL ER 500 MG PO TB24: ORAL_TABLET | ORAL | 5 refills | Status: DC

## 2021-06-23 MED ORDER — HYOSCYAMINE SULFATE ER 0.375 MG PO TB12: 0.3750 mg | ORAL_TABLET | Freq: Two times a day (BID) | ORAL | 1 refills | Status: DC | PRN

## 2021-06-23 MED ORDER — PRAVASTATIN SODIUM 20 MG PO TABS: 20.0000 mg | ORAL_TABLET | Freq: Every day | ORAL | 1 refills | Status: DC

## 2021-06-23 NOTE — Progress Notes (Signed)
Subjective:    Patient ID: Gregory Thompson, male    DOB: 01-10-1966, 55 y.o.   MRN: [id number]  Diabetes He presents for his follow-up diabetic visit. He has type 2 diabetes mellitus.  Hypertension Question about hyoscymine He has underlying conditions including hyperlipidemia, diabetes, mild stress related issues due to family issues. Works a stressful job Has irritable bowel syndrome as well Results for orders placed or performed in visit on 02/20/21  Hemoglobin A1c  Result Value Ref Range   Hgb A1c MFr Bld 7.5 (H) 4.8 - 5.6 %   Est. average glucose Bld gHb Est-mCnc 169 mg/dL  Basic metabolic panel  Result Value Ref Range   Glucose 160 (H) 70 - 99 mg/dL   BUN 15 6 - 24 mg/dL   Creatinine, Ser 1.04 0.76 - 1.27 mg/dL   eGFR 86 >59 mL/min/1.73   BUN/Creatinine Ratio 14 9 - 20   Sodium 138 134 - 144 mmol/L   Potassium 4.6 3.5 - 5.2 mmol/L   Chloride 98 96 - 106 mmol/L   CO2 21 20 - 29 mmol/L   Calcium 10.2 8.7 - 10.2 mg/dL  Uric acid  Result Value Ref Range   Uric Acid 3.6 (L) 3.8 - 8.4 mg/dL  Lipid panel  Result Value Ref Range   Cholesterol, Total 205 (H) 100 - 199 mg/dL   Triglycerides 733 (HH) 0 - 149 mg/dL   HDL 29 (L) >39 mg/dL   VLDL Cholesterol Cal 112 (H) 5 - 40 mg/dL   LDL Chol Calc (NIH) 64 0 - 99 mg/dL   Chol/HDL Ratio 7.1 (H) 0.0 - 5.0 ratio   Triglycerides severely elevated.  We did discuss ways to try to get this down including starting prescription omega-3.  In addition to this very important for the patient to fit in regular activity.  He does not smoke but he does chew tobacco.  Encourage is stay away from that.  Review of Systems     Objective:   Physical Exam Lungs clear heart regular pulse normal extremities no edema Blood pressure good      Assessment & Plan:  1. Type 2 diabetes mellitus without complication, without long-term current use of insulin ([place]) Diabetes decent control continue current measures.  Stay away from glipizide.   Jardiance seems to be helping him.  Continue metformin. - metFORMIN (GLUCOPHAGE-XR) 500 MG 24 hr tablet; Take 2 tablets by mouth daily  Dispense: 60 tablet; Refill: 5 - gemfibrozil (LOPID) 600 MG tablet; TAKE 1 TABLET BY MOUTH EVERY OTHER DAY  Dispense: 45 tablet; Refill: 1  2. Essential hypertension Blood pressure good control continue current measures - gemfibrozil (LOPID) 600 MG tablet; TAKE 1 TABLET BY MOUTH EVERY OTHER DAY  Dispense: 45 tablet; Refill: 1  3. Hyperlipidemia, unspecified hyperlipidemia type Triglycerides elevated repeat these again in approximately 6 to 8 weeks low vase a recommended.  In addition to this statin and gemfibrozil utilizing that particular medicine every other day - gemfibrozil (LOPID) 600 MG tablet; TAKE 1 TABLET BY MOUTH EVERY OTHER DAY  Dispense: 45 tablet; Refill: 1 - Lipid panel  4. Idiopathic gout, unspecified chronicity, unspecified site Continue allopurinol - gemfibrozil (LOPID) 600 MG tablet; TAKE 1 TABLET BY MOUTH EVERY OTHER DAY  Dispense: 45 tablet; Refill: 1 Stress level stable Stress and depression stable with current medication no need to adjust currently Repeat lipid profile in approximately 6 to 8 weeks repeat blood work before spring visit

## 2021-06-30 ENCOUNTER — Other Ambulatory Visit: Payer: Self-pay | Admitting: Family Medicine

## 2021-06-30 NOTE — Telephone Encounter (Signed)
MyChart message sent to patient.

## 2021-06-30 NOTE — Telephone Encounter (Signed)
May have 3 refills on the Levsin

## 2021-06-30 NOTE — Telephone Encounter (Signed)
Pt replied via my chart. Pt is not on Bently but is using Levsin and would like refill on Levsin. Please advise. Thank you

## 2021-06-30 NOTE — Telephone Encounter (Signed)
It was my understanding that he was stopping this medicine and instead was using Levsin, please talk with patient regarding this

## 2021-07-01 ENCOUNTER — Encounter: Payer: Self-pay | Admitting: Family Medicine

## 2021-07-01 MED ORDER — HYOSCYAMINE SULFATE 0.125 MG PO TABS: ORAL_TABLET | ORAL | 0 refills | Status: DC

## 2021-07-01 NOTE — Telephone Encounter (Signed)
Nurses-please see previous note 

## 2021-07-02 MED ORDER — HYOSCYAMINE SULFATE ER 0.375 MG PO TB12: 0.3750 mg | ORAL_TABLET | Freq: Two times a day (BID) | ORAL | 1 refills | Status: DC | PRN

## 2021-07-07 ENCOUNTER — Encounter: Payer: Self-pay | Admitting: Family Medicine

## 2021-07-07 NOTE — Telephone Encounter (Signed)
I do not feel that his elbow is associated with the omega-3. If he desires to stop the omega-3 he can but I do not feel that it is causing his elbow to swell Recommend follow-up if any ongoing troubles

## 2021-07-09 ENCOUNTER — Telehealth: Payer: Self-pay | Admitting: Family Medicine

## 2021-07-09 NOTE — Telephone Encounter (Signed)
Patient has been having issues with his right elbow swelling for the last week. Please advise

## 2021-07-10 NOTE — Telephone Encounter (Signed)
Patient has been informed per drs notes and due to limited availability no appts are available this week, recommended UC for evaluation of the elbow. Patient verbalizes understanding.

## 2021-07-10 NOTE — Telephone Encounter (Signed)
Due to schedule limited availability-could have infected elbow You may offer him Friday morning at 1020 if still available Or 1140 on Friday if still available Unable to see today Urgent care if necessary

## 2021-07-10 NOTE — Telephone Encounter (Addendum)
Patient states the whole elbow is swollen, maybe a little red but only sore if hit it in the right spot- Please advise

## 2021-08-07 ENCOUNTER — Telehealth: Payer: Self-pay | Admitting: Family Medicine

## 2021-08-07 ENCOUNTER — Ambulatory Visit (INDEPENDENT_AMBULATORY_CARE_PROVIDER_SITE_OTHER): Payer: Commercial Managed Care - HMO | Admitting: Nurse Practitioner

## 2021-08-07 ENCOUNTER — Encounter: Payer: Self-pay | Admitting: Nurse Practitioner

## 2021-08-07 ENCOUNTER — Encounter: Payer: Self-pay | Admitting: Family Medicine

## 2021-08-07 ENCOUNTER — Other Ambulatory Visit: Payer: Self-pay

## 2021-08-07 VITALS — BP 126/73 | HR 109 | Temp 97.9°F | Ht 68.0 in | Wt 176.0 lb

## 2021-08-07 DIAGNOSIS — R0981 Nasal congestion: Secondary | ICD-10-CM | POA: Diagnosis not present

## 2021-08-07 DIAGNOSIS — R519 Headache, unspecified: Secondary | ICD-10-CM | POA: Diagnosis not present

## 2021-08-07 NOTE — Progress Notes (Signed)
Subjective:    Patient ID: Gregory Thompson, male    DOB: September 03, 1966, 55 y.o.   MRN: [id number]  Headache  Associated symptoms include ear pain and sinus pressure. Pertinent negatives include no coughing, fever or rhinorrhea.  Sinus Problem Associated symptoms include congestion, ear pain, headaches and sinus pressure. Pertinent negatives include no chills, coughing, shortness of breath or sneezing.   Patient present to clinic with a 4 day history of frontal sinus pressure, headaches, and left ear pain. Patient states that he typically gets a sinus infection at least once a year and want to get a head of it. Patient takes tylenol and Phenylephrine which helps his congestion, left ear pain, and headache. Patient states he works at a [employer] and believes that the dust exacerbates his symptoms. Patient also states that he rides in a large truck and the bouncing in the truck exacerbates his headaches. Patient denies fevers, post nasal drip, runny nose, cough, sore throat, and body aches.   Review of Systems  Constitutional:  Negative for chills, fatigue and fever.  HENT:  Positive for congestion, ear pain, sinus pressure and sinus pain. Negative for ear discharge, mouth sores, postnasal drip, rhinorrhea and sneezing.   Respiratory:  Negative for cough, chest tightness and shortness of breath.   Cardiovascular:  Negative for chest pain and palpitations.  Neurological:  Positive for headaches.      Objective:   Physical Exam Constitutional:      General: He is not in acute distress.    Appearance: He is well-developed and normal weight. He is not ill-appearing or toxic-appearing.  HENT:     Right Ear: Tympanic membrane, ear canal and external ear normal. No decreased hearing noted. No drainage, swelling or tenderness. No middle ear effusion. There is no impacted cerumen. No mastoid tenderness. No PE tube. Tympanic membrane is not injected, scarred, perforated, erythematous, retracted or  bulging.     Left Ear: Ear canal and external ear normal. No decreased hearing noted. No drainage, swelling or tenderness.  No middle ear effusion. There is no impacted cerumen. No mastoid tenderness. No PE tube. Tympanic membrane is injected and erythematous. Tympanic membrane is not scarred, perforated, retracted or bulging.     Nose:     Right Sinus: Frontal sinus tenderness present. No maxillary sinus tenderness.     Left Sinus: Frontal sinus tenderness present. No maxillary sinus tenderness.     Mouth/Throat:     Mouth: Mucous membranes are moist.  Eyes:     Extraocular Movements: Extraocular movements intact.     Pupils: Pupils are equal, round, and reactive to light.  Cardiovascular:     Rate and Rhythm: Normal rate and regular rhythm.     Heart sounds: No murmur heard. Pulmonary:     Effort: Pulmonary effort is normal. No respiratory distress.     Breath sounds: Normal breath sounds. No wheezing.  Musculoskeletal:        General: Normal range of motion.     Cervical back: Normal range of motion. No rigidity.  Skin:    General: Skin is warm.  Neurological:     General: No focal deficit present.     Mental Status: He is alert and oriented to person, place, and time.  Psychiatric:        Mood and Affect: Mood normal.        Behavior: Behavior normal.          Assessment & Plan:   1. Sinus  congestion - Likely viral etiology - Encouraged to use netty pot or other nasal wash to clear sinus - Continue to use tylenol for pain relief - Caution with medications with phenylephrine to ensure that your BP is not affected - If not better in 5-6 days RTC, will consider abx use.   2. Acute nonintractable headache, unspecified headache type - Headache possibly r/t sinus congestion or work - May continue to use tylenol PRN - May use Ibuprofen as tolerated (hx of IBS) - RTC if not better in 5-6 days.

## 2021-08-07 NOTE — Telephone Encounter (Signed)
Nurse please close error he wanted appointment

## 2021-08-07 NOTE — Patient Instructions (Signed)
Return to clinic if not feeling better with 5-6 days.  Use other the counter netty pot or nasal wash for symptoms.

## 2021-08-07 NOTE — Telephone Encounter (Signed)
Pt has appt for 4 pm today

## 2021-08-17 ENCOUNTER — Other Ambulatory Visit: Payer: Self-pay | Admitting: Family Medicine

## 2021-08-17 DIAGNOSIS — E119 Type 2 diabetes mellitus without complications: Secondary | ICD-10-CM

## 2021-08-17 DIAGNOSIS — I10 Essential (primary) hypertension: Secondary | ICD-10-CM

## 2021-08-17 DIAGNOSIS — E785 Hyperlipidemia, unspecified: Secondary | ICD-10-CM

## 2021-08-17 DIAGNOSIS — M1000 Idiopathic gout, unspecified site: Secondary | ICD-10-CM

## 2021-09-24 DIAGNOSIS — K219 Gastro-esophageal reflux disease without esophagitis: Secondary | ICD-10-CM | POA: Insufficient documentation

## 2021-09-24 DIAGNOSIS — F419 Anxiety disorder, unspecified: Secondary | ICD-10-CM | POA: Insufficient documentation

## 2021-09-24 DIAGNOSIS — I10 Essential (primary) hypertension: Secondary | ICD-10-CM | POA: Insufficient documentation

## 2021-09-24 DIAGNOSIS — E1165 Type 2 diabetes mellitus with hyperglycemia: Secondary | ICD-10-CM | POA: Insufficient documentation

## 2021-09-29 ENCOUNTER — Encounter (INDEPENDENT_AMBULATORY_CARE_PROVIDER_SITE_OTHER): Payer: Self-pay | Admitting: *Deleted

## 2021-10-21 ENCOUNTER — Other Ambulatory Visit: Payer: Self-pay | Admitting: Family Medicine

## 2021-11-06 ENCOUNTER — Other Ambulatory Visit: Payer: Self-pay | Admitting: Family Medicine

## 2021-12-08 ENCOUNTER — Ambulatory Visit (INDEPENDENT_AMBULATORY_CARE_PROVIDER_SITE_OTHER): Payer: 59 | Admitting: Gastroenterology

## 2021-12-10 DIAGNOSIS — F17200 Nicotine dependence, unspecified, uncomplicated: Secondary | ICD-10-CM | POA: Insufficient documentation

## 2021-12-10 DIAGNOSIS — F32A Depression, unspecified: Secondary | ICD-10-CM | POA: Insufficient documentation

## 2022-01-07 DIAGNOSIS — R519 Headache, unspecified: Secondary | ICD-10-CM | POA: Insufficient documentation

## 2022-01-07 DIAGNOSIS — R112 Nausea with vomiting, unspecified: Secondary | ICD-10-CM | POA: Insufficient documentation

## 2022-01-07 DIAGNOSIS — A084 Viral intestinal infection, unspecified: Secondary | ICD-10-CM | POA: Insufficient documentation

## 2022-01-26 ENCOUNTER — Ambulatory Visit (INDEPENDENT_AMBULATORY_CARE_PROVIDER_SITE_OTHER): Payer: 59 | Admitting: Gastroenterology

## 2022-03-31 DIAGNOSIS — M5417 Radiculopathy, lumbosacral region: Secondary | ICD-10-CM | POA: Insufficient documentation

## 2022-04-01 ENCOUNTER — Other Ambulatory Visit (HOSPITAL_COMMUNITY): Payer: Self-pay | Admitting: Nurse Practitioner

## 2022-04-01 DIAGNOSIS — M5416 Radiculopathy, lumbar region: Secondary | ICD-10-CM

## 2022-04-03 ENCOUNTER — Ambulatory Visit (HOSPITAL_COMMUNITY)
Admission: RE | Admit: 2022-04-03 | Discharge: 2022-04-03 | Disposition: A | Discharge status: Home / Self Care | Payer: 59 | Source: Ambulatory Visit | Attending: Nurse Practitioner | Admitting: Nurse Practitioner

## 2022-04-03 DIAGNOSIS — M5416 Radiculopathy, lumbar region: Secondary | ICD-10-CM | POA: Insufficient documentation

## 2022-04-28 ENCOUNTER — Other Ambulatory Visit: Payer: Self-pay | Admitting: Neurosurgery

## 2022-04-28 DIAGNOSIS — M5416 Radiculopathy, lumbar region: Secondary | ICD-10-CM

## 2022-05-15 ENCOUNTER — Ambulatory Visit
Admission: RE | Admit: 2022-05-15 | Discharge: 2022-05-15 | Disposition: A | Discharge status: Home / Self Care | Payer: 59 | Source: Ambulatory Visit | Attending: Neurosurgery | Admitting: Neurosurgery

## 2022-05-15 DIAGNOSIS — M5416 Radiculopathy, lumbar region: Secondary | ICD-10-CM

## 2022-08-13 LAB — LAB REPORT - SCANNED
A1c: 5.9
Albumin, Urine POC: 27.8
Creatinine, Urine.: 86
EGFR: 84
Microalb Creat Ratio: 32

## 2022-08-18 DIAGNOSIS — R809 Proteinuria, unspecified: Secondary | ICD-10-CM | POA: Insufficient documentation

## 2023-01-05 ENCOUNTER — Ambulatory Visit (INDEPENDENT_AMBULATORY_CARE_PROVIDER_SITE_OTHER): Payer: 59 | Admitting: Family Medicine

## 2023-01-05 ENCOUNTER — Encounter: Payer: Self-pay | Admitting: Family Medicine

## 2023-01-05 VITALS — BP 133/86 | HR 96 | Ht 68.0 in | Wt 181.0 lb

## 2023-01-05 DIAGNOSIS — R7301 Impaired fasting glucose: Secondary | ICD-10-CM

## 2023-01-05 DIAGNOSIS — I10 Essential (primary) hypertension: Secondary | ICD-10-CM | POA: Diagnosis not present

## 2023-01-05 DIAGNOSIS — Z7984 Long term (current) use of oral hypoglycemic drugs: Secondary | ICD-10-CM

## 2023-01-05 DIAGNOSIS — K219 Gastro-esophageal reflux disease without esophagitis: Secondary | ICD-10-CM

## 2023-01-05 DIAGNOSIS — E0789 Other specified disorders of thyroid: Secondary | ICD-10-CM

## 2023-01-05 DIAGNOSIS — E119 Type 2 diabetes mellitus without complications: Secondary | ICD-10-CM

## 2023-01-05 DIAGNOSIS — E7849 Other hyperlipidemia: Secondary | ICD-10-CM

## 2023-01-05 DIAGNOSIS — E559 Vitamin D deficiency, unspecified: Secondary | ICD-10-CM | POA: Diagnosis not present

## 2023-01-05 NOTE — Patient Instructions (Addendum)
  I appreciate the opportunity to provide care to you today!    Follow up:  3 months  Labs: please stop by the lab during the week  to get your blood drawn (CBC, CMP, TSH, Lipid profile, HgA1c, Vit D)  Please schedule annual eye exam   Please continue to a heart-healthy diet and increase your physical activities. Try to exercise for at least five days a week.      It was a pleasure to see you and I look forward to continuing to work together on your health and well-being. Please do not hesitate to call the office if you need care or have questions about your care.   Have a wonderful day and week. With Gratitude, Gilmore Laroche MSN, FNP-BC

## 2023-01-05 NOTE — Assessment & Plan Note (Signed)
Stable on Protonix 40 mg daily. 

## 2023-01-05 NOTE — Assessment & Plan Note (Addendum)
Controlled He takes losartan 50 mg daily Asymptomatic today Low-sodium diet with increased physical activity encouraged BP Readings from Last 3 Encounters:  01/05/23 133/86  08/07/21 126/73  06/23/21 117/75

## 2023-01-05 NOTE — Progress Notes (Signed)
Established Patient Office Visit  Subjective:  Patient ID: Gregory Thompson, male    DOB: 05/11/1966  Age: 57 y.o. MRN: [id number]  CC:  Chief Complaint  Patient presents with   Establish Care    Pt establishing care looking for PCP, previously seeing by [facility].     HPI Gregory Thompson is a 57 y.o. male with past medical history of hypertension, hyperlipidemia, tobacco use, GERD and type 2 diabetes presents for establishing care. For the details of today's visit, please refer to the assessment and plan.    Past Medical History:  Diagnosis Date   Complication of anesthesia    nausea   Fatty liver    Gout    Hx of gallstones    Hypertension    Hypertriglyceridemia    Kidney stone    Liver cirrhosis (HCC) 1997   per patient: imaging does not indicate this as of 2019   Prediabetes     Past Surgical History:  Procedure Laterality Date   BILIARY STENT PLACEMENT  02/11/2018   Procedure: BILIARY STENT PLACEMENT;  Surgeon: Sherrilyn Rist, MD;  Location: [facility] ENDOSCOPY;  Service: Gastroenterology;;   CHOLECYSTECTOMY N/A 02/09/2018   Procedure: LAPAROSCOPIC CHOLECYSTECTOMY WITH [name];  Surgeon: Kinsinger, De Blanch, MD;  Location: [facility] OR;  Service: General;  Laterality: N/A;   COLONOSCOPY      remote past by Dr. Karilyn Cota 20 years ago   ENDOSCOPIC RETROGRADE CHOLANGIOPANCREATOGRAPHY (ERCP) WITH PROPOFOL N/A 02/08/2018   Procedure: ENDOSCOPIC RETROGRADE CHOLANGIOPANCREATOGRAPHY (ERCP) WITH PROPOFOL;  Surgeon: Sherrilyn Rist, MD;  Location: [facility] ENDOSCOPY;  Service: Gastroenterology;  Laterality: N/A;   ENDOSCOPIC RETROGRADE CHOLANGIOPANCREATOGRAPHY (ERCP) WITH PROPOFOL N/A 02/11/2018   Procedure: ENDOSCOPIC RETROGRADE CHOLANGIOPANCREATOGRAPHY (ERCP) WITH PROPOFOL;  Surgeon: Sherrilyn Rist, MD;  Location: [facility] ENDOSCOPY;  Service: Gastroenterology;  Laterality: N/A;   ESOPHAGOGASTRODUODENOSCOPY     EGD 20 years ago   LIVER BIOPSY     REMOVAL OF STONES  02/11/2018   Procedure:  REMOVAL OF STONES;  Surgeon: Sherrilyn Rist, MD;  Location: [facility] ENDOSCOPY;  Service: Gastroenterology;;   [name]  02/08/2018   Procedure: SPHINCTEROTOMY;  Surgeon: Sherrilyn Rist, MD;  Location: [facility] ENDOSCOPY;  Service: Gastroenterology;;   [name]  02/11/2018   Procedure: SPHINCTEROTOMY;  Surgeon: Sherrilyn Rist, MD;  Location: [facility] ENDOSCOPY;  Service: Gastroenterology;;   VASECTOMY      Family History  Problem Relation Age of Onset   Hyperlipidemia Father    Diabetes Paternal Grandfather    Colon cancer Neg Hx    Colon polyps Neg Hx    Liver disease Neg Hx     Social History   Socioeconomic History   Marital status: Married    Spouse name: Not on file   Number of children: Not on file   Years of education: Not on file   Highest education level: Not on file  Occupational History   Not on file  Tobacco Use   Smoking status: Former    Types: Cigarettes   Smokeless tobacco: Current    Types: Chew  Vaping Use   Vaping Use: Never used  Substance and Sexual Activity   Alcohol use: No   Drug use: No   Sexual activity: Yes  Other Topics Concern   Not on file  Social History Narrative   Not on file   Social Determinants of Health   Financial Resource Strain: Not on file  Food Insecurity: Not on file  Transportation Needs: Not on file  Physical Activity: Not on file  Stress: Not on file  Social Connections: Not on file  Intimate Partner Violence: Not on file    Outpatient Medications Prior to Visit  Medication Sig Dispense Refill   allopurinol (ZYLOPRIM) 300 MG tablet TAKE 1 TABLET(300 MG) BY MOUTH DAILY 90 tablet 1   Blood Glucose Monitoring Suppl MISC Glucose testing supplies-test once a day 50 each 11   CINNAMON PO Take 2,000 mg by mouth daily. 2,000 mg      empagliflozin (JARDIANCE) 25 MG TABS tablet Take one tablet po daily 90 tablet 1   gemfibrozil (LOPID) 600 MG tablet TAKE 1 TABLET BY MOUTH EVERY OTHER DAY 45 tablet 0   hyoscyamine  (LEVBID) 0.375 MG 12 hr tablet Take 1 tablet (0.375 mg total) by mouth every 12 (twelve) hours as needed. 180 tablet 1   losartan (COZAAR) 50 MG tablet TAKE 1 TABLET(50 MG) BY MOUTH DAILY 90 tablet 1   metFORMIN (GLUCOPHAGE-XR) 500 MG 24 hr tablet Take 2 tablets by mouth daily (Patient taking differently: Take 500 mg by mouth. Take 3 tablets by mouth daily) 60 tablet 5   Omega-3 Fatty Acids (OMEGA-3 FISH OIL PO) Take by mouth.     ONE TOUCH ULTRA TEST test strip TEST ONCE DAILY AS DIRECTED 50 each 4   pantoprazole (PROTONIX) 40 MG tablet TAKE 1 TABLET(40 MG) BY MOUTH TWICE DAILY 180 tablet 1   pravastatin (PRAVACHOL) 20 MG tablet Take 1 tablet (20 mg total) by mouth daily. 90 tablet 1   Probiotic Product (PROBIOTIC FORMULA PO) Take 1 capsule by mouth daily. PHILLIPS     sertraline (ZOLOFT) 50 MG tablet 1 qd 30 tablet 5   sildenafil (REVATIO) 20 MG tablet 2 to 5 before relations 50 tablet 1   ALPRAZolam (XANAX) 0.5 MG tablet Take 0.5-1 tablets (0.25-0.5 mg total) by mouth at bedtime as needed. 30 tablet 4   hyoscyamine (LEVSIN) 0.125 MG tablet Take one tablet po TID prn 270 tablet 0   omega-3 acid ethyl esters (LOVAZA) 1 g capsule Take 2 capsules (2 g total) by mouth 2 (two) times daily. 120 capsule 5   No facility-administered medications prior to visit.    Allergies  Allergen Reactions   Eluxadoline Other (See Comments)    Low blood sugar Low blood sugar   Azithromycin     Abdominal pain   Cefzil [Cefprozil]     Abdominal pain   Lisinopril     Cough has a side effect    ROS Review of Systems  Constitutional:  Negative for fatigue and fever.  Eyes:  Negative for visual disturbance.  Respiratory:  Negative for chest tightness and shortness of breath.   Cardiovascular:  Negative for chest pain and palpitations.  Neurological:  Negative for dizziness and headaches.      Objective:    Physical Exam HENT:     Head: Normocephalic.     Right Ear: External ear normal.     Left  Ear: External ear normal.     Nose: No congestion or rhinorrhea.     Mouth/Throat:     Mouth: Mucous membranes are moist.  Cardiovascular:     Rate and Rhythm: Regular rhythm.     Heart sounds: No murmur heard. Pulmonary:     Effort: No respiratory distress.     Breath sounds: Normal breath sounds.  Neurological:     Mental Status: He is alert.     BP 133/86  Pulse 96   Ht 5\' 8"  (1.727 m)   Wt 181 lb (82.1 kg)   SpO2 90%   BMI 27.52 kg/m  Wt Readings from Last 3 Encounters:  01/05/23 181 lb (82.1 kg)  08/07/21 176 lb (79.8 kg)  06/23/21 173 lb (78.5 kg)    No results found for: "TSH" Lab Results  Component Value Date   WBC 7.9 07/07/2018   HGB 14.5 07/07/2018   HCT 43.4 07/07/2018   MCV 86 07/07/2018   PLT 230 07/07/2018   Lab Results  Component Value Date   NA 138 06/18/2021   K 4.6 06/18/2021   CO2 21 06/18/2021   GLUCOSE 160 (H) 06/18/2021   BUN 15 06/18/2021   CREATININE 1.04 06/18/2021   BILITOT 0.4 09/10/2020   ALKPHOS 61 09/10/2020   AST 27 09/10/2020   ALT 37 09/10/2020   PROT 7.8 09/10/2020   ALBUMIN 4.9 09/10/2020   CALCIUM 10.2 06/18/2021   ANIONGAP 6 02/12/2018   EGFR 86 06/18/2021   Lab Results  Component Value Date   CHOL 205 (H) 06/18/2021   Lab Results  Component Value Date   HDL 29 (L) 06/18/2021   Lab Results  Component Value Date   LDLCALC 64 06/18/2021   Lab Results  Component Value Date   TRIG 733 (HH) 06/18/2021   Lab Results  Component Value Date   CHOLHDL 7.1 (H) 06/18/2021   Lab Results  Component Value Date   HGBA1C 7.5 (H) 06/18/2021      Assessment & Plan:  Type 2 diabetes mellitus without complication, without long-term current use of insulin (HCC) Assessment & Plan: Reports taking Trulicity 1.5 mg weekly, however the medication was discontinued He is currently taking Jardiance 25 mg, metformin 500 mg daily and glipizide Denies polyuria, polyphagia,polydispsia Pending hemoglobin A1c  Orders: -      Microalbumin / creatinine urine ratio -     BMP8+EGFR  Primary hypertension Assessment & Plan: Controlled He takes losartan 50 mg daily Asymptomatic today Low-sodium diet with increased physical activity encouraged BP Readings from Last 3 Encounters:  01/05/23 133/86  08/07/21 126/73  06/23/21 117/75      Gastroesophageal reflux disease without esophagitis Assessment & Plan: Stable on Protonix 40 mg daily    Vitamin D deficiency -     VITAMIN D 25 Hydroxy (Vit-D Deficiency, Fractures)  Impaired fasting blood sugar -     Hemoglobin A1c  Other hyperlipidemia -     CBC with Differential/Platelet -     CMP14+EGFR -     Lipid panel  Other specified disorders of thyroid -     TSH + free T4    Follow-up: Return in about 3 months (around 04/06/2023).   Gilmore Laroche, FNP

## 2023-01-05 NOTE — Assessment & Plan Note (Signed)
Reports taking Trulicity 1.5 mg weekly, however the medication was discontinued He is currently taking Jardiance 25 mg, metformin 500 mg daily and glipizide Denies polyuria, polyphagia,polydispsia Pending hemoglobin A1c

## 2023-01-14 ENCOUNTER — Other Ambulatory Visit: Payer: Self-pay | Admitting: Neurosurgery

## 2023-01-20 ENCOUNTER — Other Ambulatory Visit: Payer: Self-pay

## 2023-01-25 NOTE — Progress Notes (Signed)
Patient name: Gregory Thompson MRN: [id number] DOB: Jul 28, 1966 Sex: male  REASON FOR CONSULT: Anterior spine exposure for L5-S1 ALIF  HPI: Gregory Thompson is a 57 y.o. male, with history of hypertension, hypertriglyceridemia, liver cirrhosis that presents for evaluation of anterior spine exposure for L5-S1 ALIF.  Past Medical History:  Diagnosis Date   Complication of anesthesia    nausea   Fatty liver    Gout    Hx of gallstones    Hypertension    Hypertriglyceridemia    Kidney stone    Liver cirrhosis (HCC) 1997   per patient: imaging does not indicate this as of 2019   Prediabetes     Past Surgical History:  Procedure Laterality Date   BILIARY STENT PLACEMENT  02/11/2018   Procedure: BILIARY STENT PLACEMENT;  Surgeon: Sherrilyn Rist, MD;  Location: [facility] ENDOSCOPY;  Service: Gastroenterology;;   CHOLECYSTECTOMY N/A 02/09/2018   Procedure: LAPAROSCOPIC CHOLECYSTECTOMY WITH [name];  Surgeon: Kinsinger, De Blanch, MD;  Location: [facility] OR;  Service: General;  Laterality: N/A;   COLONOSCOPY      remote past by Dr. Karilyn Cota 20 years ago   ENDOSCOPIC RETROGRADE CHOLANGIOPANCREATOGRAPHY (ERCP) WITH PROPOFOL N/A 02/08/2018   Procedure: ENDOSCOPIC RETROGRADE CHOLANGIOPANCREATOGRAPHY (ERCP) WITH PROPOFOL;  Surgeon: Sherrilyn Rist, MD;  Location: [facility] ENDOSCOPY;  Service: Gastroenterology;  Laterality: N/A;   ENDOSCOPIC RETROGRADE CHOLANGIOPANCREATOGRAPHY (ERCP) WITH PROPOFOL N/A 02/11/2018   Procedure: ENDOSCOPIC RETROGRADE CHOLANGIOPANCREATOGRAPHY (ERCP) WITH PROPOFOL;  Surgeon: Sherrilyn Rist, MD;  Location: [facility] ENDOSCOPY;  Service: Gastroenterology;  Laterality: N/A;   ESOPHAGOGASTRODUODENOSCOPY     EGD 20 years ago   LIVER BIOPSY     REMOVAL OF STONES  02/11/2018   Procedure: REMOVAL OF STONES;  Surgeon: Sherrilyn Rist, MD;  Location: [facility] ENDOSCOPY;  Service: Gastroenterology;;   [name]  02/08/2018   Procedure: SPHINCTEROTOMY;  Surgeon: Sherrilyn Rist, MD;   Location: [facility] ENDOSCOPY;  Service: Gastroenterology;;   [name]  02/11/2018   Procedure: SPHINCTEROTOMY;  Surgeon: Sherrilyn Rist, MD;  Location: [facility] ENDOSCOPY;  Service: Gastroenterology;;   VASECTOMY      Family History  Problem Relation Age of Onset   Hyperlipidemia Father    Diabetes Paternal Grandfather    Colon cancer Neg Hx    Colon polyps Neg Hx    Liver disease Neg Hx     SOCIAL HISTORY: Social History   Socioeconomic History   Marital status: Married    Spouse name: Not on file   Number of children: Not on file   Years of education: Not on file   Highest education level: Not on file  Occupational History   Not on file  Tobacco Use   Smoking status: Former    Types: Cigarettes   Smokeless tobacco: Current    Types: Chew  Vaping Use   Vaping Use: Never used  Substance and Sexual Activity   Alcohol use: No   Drug use: No   Sexual activity: Yes  Other Topics Concern   Not on file  Social History Narrative   Not on file   Social Determinants of Health   Financial Resource Strain: Not on file  Food Insecurity: Not on file  Transportation Needs: Not on file  Physical Activity: Not on file  Stress: Not on file  Social Connections: Not on file  Intimate Partner Violence: Not on file    Allergies  Allergen Reactions   Eluxadoline Other (See Comments)  Low blood sugar    Azithromycin     Abdominal pain   Cefzil [Cefprozil]     Abdominal pain   Lisinopril Cough    Current Outpatient Medications  Medication Sig Dispense Refill   acetaminophen (TYLENOL) 500 MG tablet Take 1,000 mg by mouth every 6 (six) hours as needed for moderate pain.     allopurinol (ZYLOPRIM) 300 MG tablet TAKE 1 TABLET(300 MG) BY MOUTH DAILY 90 tablet 1   APPLE CIDER VINEGAR PO Take 1 capsule by mouth 2 (two) times daily.     Ascorbic Acid (VITAMIN C) 1000 MG tablet Take 1,000 mg by mouth 2 (two) times daily.     Blood Glucose Monitoring Suppl MISC Glucose testing  supplies-test once a day 50 each 11   CINNAMON PO Take 1,000 mg by mouth 2 (two) times daily.     empagliflozin (JARDIANCE) 25 MG TABS tablet Take one tablet po daily 90 tablet 1   gemfibrozil (LOPID) 600 MG tablet TAKE 1 TABLET BY MOUTH EVERY OTHER DAY (Patient taking differently: Take 600 mg by mouth daily.) 45 tablet 0   glipiZIDE (GLUCOTROL XL) 2.5 MG 24 hr tablet Take 2.5 mg by mouth daily.     hyoscyamine (LEVBID) 0.375 MG 12 hr tablet Take 1 tablet (0.375 mg total) by mouth every 12 (twelve) hours as needed. (Patient taking differently: Take 0.375 mg by mouth 2 (two) times daily.) 180 tablet 1   losartan (COZAAR) 50 MG tablet TAKE 1 TABLET(50 MG) BY MOUTH DAILY 90 tablet 1   Melatonin 10 MG CAPS Take 10 mg by mouth at bedtime as needed (sleep).     metFORMIN (GLUCOPHAGE-XR) 500 MG 24 hr tablet Take 2 tablets by mouth daily (Patient taking differently: Take 500-1,000 mg by mouth See admin instructions. Take 1000 mg in the morning and 500 mg in the evening) 60 tablet 5   Omega-3 Fatty Acids (OMEGA-3 FISH OIL PO) Take 2,000 mg by mouth daily.     ONE TOUCH ULTRA TEST test strip TEST ONCE DAILY AS DIRECTED 50 each 4   pantoprazole (PROTONIX) 40 MG tablet TAKE 1 TABLET(40 MG) BY MOUTH TWICE DAILY (Patient taking differently: Take 40 mg by mouth daily.) 180 tablet 1   Phenylephrine-APAP-guaiFENesin (TYLENOL SINUS SEVERE PO) Take 2 tablets by mouth daily as needed (congestion).     pravastatin (PRAVACHOL) 40 MG tablet Take 40 mg by mouth daily.     Probiotic Product (PROBIOTIC PO) Take 1 capsule by mouth daily.     sertraline (ZOLOFT) 50 MG tablet 1 qd 30 tablet 5   SIMPLY SALINE NA Place 1 spray into the nose daily as needed (congestion).     No current facility-administered medications for this visit.    REVIEW OF SYSTEMS:  [X]  denotes positive finding, [ ]  denotes negative finding Cardiac  Comments:  Chest pain or chest pressure: ***   Shortness of breath upon exertion:    Short of  breath when lying flat:    Irregular heart rhythm:        Vascular    Pain in calf, thigh, or hip brought on by ambulation:    Pain in feet at night that wakes you up from your sleep:     Blood clot in your veins:    Leg swelling:         Pulmonary    Oxygen at home:    Productive cough:     Wheezing:         Neurologic  Sudden weakness in arms or legs:     Sudden numbness in arms or legs:     Sudden onset of difficulty speaking or slurred speech:    Temporary loss of vision in one eye:     Problems with dizziness:         Gastrointestinal    Blood in stool:     Vomited blood:         Genitourinary    Burning when urinating:     Blood in urine:        Psychiatric    Major depression:         Hematologic    Bleeding problems:    Problems with blood clotting too easily:        Skin    Rashes or ulcers:        Constitutional    Fever or chills:      PHYSICAL EXAM: There were no vitals filed for this visit.  GENERAL: The patient is a well-nourished male, in no acute distress. The vital signs are documented above. CARDIAC: There is a regular rate and rhythm.  VASCULAR: *** PULMONARY: There is good air exchange bilaterally without wheezing or rales. ABDOMEN: Soft and non-tender with normal pitched bowel sounds.  MUSCULOSKELETAL: There are no major deformities or cyanosis. NEUROLOGIC: No focal weakness or paresthesias are detected. SKIN: There are no ulcers or rashes noted. PSYCHIATRIC: The patient has a normal affect.  DATA:   MRI Lumbar Spine Reviewed 05/18/22    Assessment/Plan:  57 year old male with chronic lower back pain that presents for evaluation of anterior spine exposure for L5-S1 ALIF.  I reviewed his MRI and discussed he would be a good candidate for anterior approach.  I discussed transverse incision over the left rectus muscle mobilizing the left rectus muscle to enter into the retroperitoneum and mobilize peritoneum and left ureter across  midline.  Discussed mobilizing iliac artery and vein to get to the disc space from the front.  Discussed risk of injury to the above structures.  Risk of right retrograde ejaculation as well as hernia.  All questions answered.  Look forward to assisting Dr. Jordan Likes on 02/05/23.   Cephus Shelling, MD [facility] Office: [phone]

## 2023-01-26 ENCOUNTER — Other Ambulatory Visit: Payer: Self-pay

## 2023-01-26 ENCOUNTER — Ambulatory Visit: Payer: 59 | Admitting: Vascular Surgery

## 2023-01-26 ENCOUNTER — Encounter (HOSPITAL_COMMUNITY)
Admission: RE | Admit: 2023-01-26 | Discharge: 2023-01-26 | Disposition: A | Discharge status: Home / Self Care | Payer: 59 | Source: Ambulatory Visit | Attending: Neurosurgery | Admitting: Neurosurgery

## 2023-01-26 ENCOUNTER — Encounter (HOSPITAL_COMMUNITY): Payer: Self-pay

## 2023-01-26 VITALS — BP 114/76 | HR 97 | Temp 98.0°F | Resp 16 | Ht 68.0 in | Wt 176.0 lb

## 2023-01-26 DIAGNOSIS — I10 Essential (primary) hypertension: Secondary | ICD-10-CM | POA: Diagnosis not present

## 2023-01-26 DIAGNOSIS — K760 Fatty (change of) liver, not elsewhere classified: Secondary | ICD-10-CM | POA: Diagnosis not present

## 2023-01-26 DIAGNOSIS — Z87891 Personal history of nicotine dependence: Secondary | ICD-10-CM | POA: Diagnosis not present

## 2023-01-26 DIAGNOSIS — Z9049 Acquired absence of other specified parts of digestive tract: Secondary | ICD-10-CM | POA: Insufficient documentation

## 2023-01-26 DIAGNOSIS — Z01818 Encounter for other preprocedural examination: Secondary | ICD-10-CM

## 2023-01-26 DIAGNOSIS — M109 Gout, unspecified: Secondary | ICD-10-CM | POA: Diagnosis not present

## 2023-01-26 DIAGNOSIS — M4310 Spondylolisthesis, site unspecified: Secondary | ICD-10-CM | POA: Diagnosis not present

## 2023-01-26 DIAGNOSIS — Z981 Arthrodesis status: Secondary | ICD-10-CM | POA: Insufficient documentation

## 2023-01-26 DIAGNOSIS — E781 Pure hyperglyceridemia: Secondary | ICD-10-CM | POA: Diagnosis not present

## 2023-01-26 DIAGNOSIS — E119 Type 2 diabetes mellitus without complications: Secondary | ICD-10-CM | POA: Diagnosis not present

## 2023-01-26 DIAGNOSIS — M545 Low back pain, unspecified: Secondary | ICD-10-CM | POA: Diagnosis not present

## 2023-01-26 DIAGNOSIS — G8929 Other chronic pain: Secondary | ICD-10-CM | POA: Diagnosis not present

## 2023-01-26 DIAGNOSIS — I493 Ventricular premature depolarization: Secondary | ICD-10-CM | POA: Diagnosis not present

## 2023-01-26 DIAGNOSIS — K219 Gastro-esophageal reflux disease without esophagitis: Secondary | ICD-10-CM | POA: Insufficient documentation

## 2023-01-26 HISTORY — DX: Type 2 diabetes mellitus without complications: E11.9

## 2023-01-26 LAB — CBC
HCT: 45 % (ref 39.0–52.0)
Hemoglobin: 15 g/dL (ref 13.0–17.0)
MCH: 29.6 pg (ref 26.0–34.0)
MCHC: 33.3 g/dL (ref 30.0–36.0)
MCV: 88.9 fL (ref 80.0–100.0)
Platelets: 245 10*3/uL (ref 150–400)
RBC: 5.06 MIL/uL (ref 4.22–5.81)
RDW: 14.6 % (ref 11.5–15.5)
WBC: 6.4 10*3/uL (ref 4.0–10.5)
nRBC: 0 % (ref 0.0–0.2)

## 2023-01-26 LAB — BASIC METABOLIC PANEL
Anion gap: 12 (ref 5–15)
BUN: 17 mg/dL (ref 6–20)
CO2: 23 mmol/L (ref 22–32)
Calcium: 9.6 mg/dL (ref 8.9–10.3)
Chloride: 105 mmol/L (ref 98–111)
Creatinine, Ser: 1.23 mg/dL (ref 0.61–1.24)
GFR, Estimated: >60 mL/min (ref >60)
Glucose, Bld: 151 mg/dL — ABNORMAL HIGH (ref 70–99)
Potassium: 4 mmol/L (ref 3.5–5.1)
Sodium: 140 mmol/L (ref 135–145)

## 2023-01-26 LAB — TYPE AND SCREEN
ABO/RH(D): A POS
Antibody Screen: NEGATIVE

## 2023-01-26 LAB — HEMOGLOBIN A1C
Hgb A1c MFr Bld: 6.1 % — ABNORMAL HIGH (ref 4.8–5.6)
Mean Plasma Glucose: 128.37 mg/dL

## 2023-01-26 LAB — SURGICAL PCR SCREEN
MRSA, PCR: NEGATIVE
Staphylococcus aureus: POSITIVE — AB

## 2023-01-26 LAB — GLUCOSE, CAPILLARY: Glucose-Capillary: 162 mg/dL — ABNORMAL HIGH (ref 70–99)

## 2023-01-26 NOTE — Progress Notes (Addendum)
Surgical Instructions    Your procedure is scheduled on Friday May 31st.  Report to [facility] Main Entrance "A" at 5:30 A.M., then check in with the Admitting office.  Call this number if you have problems the morning of surgery:  [phone]   If you have any questions prior to your surgery date call [phone]: Open Monday-Friday 8am-4pm If you experience any cold or flu symptoms such as cough, fever, chills, shortness of breath, etc. between now and your scheduled surgery, please notify us at the above number     Remember:  Do not eat or drink after midnight the night before your surgery   Take these medicines the morning of surgery with A SIP OF WATER: allopurinol (ZYLOPRIM) 300 MG tablet  gemfibrozil (LOPID) 600 MG tablet  hyoscyamine (LEVBID) 0.375 MG 12 hr tablet  pantoprazole (PROTONIX) 40 MG tablet  pravastatin (PRAVACHOL) 40 MG tablet  sertraline (ZOLOFT) 50 MG tablet   IF NEEDED  acetaminophen (TYLENOL) 500 MG tablet      As of today, STOP taking any Aspirin (unless otherwise instructed by your surgeon) Aleve, Naproxen, Ibuprofen, Motrin, Advil, Goody's, BC's, all herbal medications, fish oil, and all vitamins.  WHAT DO I DO ABOUT MY DIABETES MEDICATION?  Hold your Jardiance 3 days prior to the procedure  Do not take oral diabetes medicines (Jardiance/Glipizide/Metformin) the morning of surgery.    HOW TO MANAGE YOUR DIABETES BEFORE AND AFTER SURGERY  Why is it important to control my blood sugar before and after surgery? Improving blood sugar levels before and after surgery helps healing and can limit problems. A way of improving blood sugar control is eating a healthy diet by:  Eating less sugar and carbohydrates  Increasing activity/exercise  Talking with your doctor about reaching your blood sugar goals High blood sugars (greater than 180 mg/dL) can raise your risk of infections and slow your recovery, so you will need to focus on controlling your  diabetes during the weeks before surgery. Make sure that the doctor who takes care of your diabetes knows about your planned surgery including the date and location.  How do I manage my blood sugar before surgery? Check your blood sugar at least 4 times a day, starting 2 days before surgery, to make sure that the level is not too high or low.  Check your blood sugar the morning of your surgery when you wake up and every 2 hours until you get to the Short Stay unit.  If your blood sugar is less than 70 mg/dL, you will need to treat for low blood sugar: Do not take insulin. Treat a low blood sugar (less than 70 mg/dL) with  cup of clear juice (cranberry or apple), 4 glucose tablets, OR glucose gel. Recheck blood sugar in 15 minutes after treatment (to make sure it is greater than 70 mg/dL). If your blood sugar is not greater than 70 mg/dL on recheck, call [phone] for further instructions. Report your blood sugar to the short stay nurse when you get to Short Stay.  If you are admitted to the hospital after surgery: Your blood sugar will be checked by the staff and you will probably be given insulin after surgery (instead of oral diabetes medicines) to make sure you have good blood sugar levels. The goal for blood sugar control after surgery is 80-180 mg/dL.           Do not wear jewelry  Do not wear lotions, powders, cologne or deodorant. Do not shave  48 hours prior to surgery.  Men may shave face and neck. Do not bring valuables to the hospital. Do not wear nail polish  [facility] is not responsible for any belongings or valuables.    Do NOT Smoke (Tobacco/Vaping)  24 hours prior to your procedure  If you use a CPAP at night, you may bring your mask for your overnight stay.   Contacts, glasses, hearing aids, dentures or partials may not be worn into surgery, please bring cases for these belongings   For patients admitted to the hospital, discharge time will be determined by your  treatment team.   Patients discharged the day of surgery will not be allowed to drive home, and someone needs to stay with them for 24 hours.   SURGICAL WAITING ROOM VISITATION Patients having surgery or a procedure may have no more than 2 support people in the waiting area - these visitors may rotate.   Children under the age of 14 must have an adult with them who is not the patient. If the patient needs to stay at the hospital during part of their recovery, the visitor guidelines for inpatient rooms apply. Pre-op nurse will coordinate an appropriate time for 1 support person to accompany patient in pre-op.  This support person may not rotate.   Please refer to [email or web address] for the visitor guidelines for Inpatients (after your surgery is over and you are in a regular room).    Special instructions:    Oral Hygiene is also important to reduce your risk of infection.  Remember - BRUSH YOUR TEETH THE MORNING OF SURGERY WITH YOUR REGULAR TOOTHPASTE   [facility]- Preparing For Surgery  Before surgery, you can play an important role. Because skin is not sterile, your skin needs to be as free of germs as possible. You can reduce the number of germs on your skin by washing with CHG (chlorahexidine gluconate) Soap before surgery.  CHG is an antiseptic cleaner which kills germs and bonds with the skin to continue killing germs even after washing.     Please do not use if you have an allergy to CHG or antibacterial soaps. If your skin becomes reddened/irritated stop using the CHG.  Do not shave (including legs and underarms) for at least 48 hours prior to first CHG shower. It is OK to shave your face.  Please follow these instructions carefully.     Shower the NIGHT BEFORE SURGERY and the MORNING OF SURGERY with CHG Soap.   If you chose to wash your hair, wash your hair first as usual with your normal shampoo. After you shampoo, rinse  your hair and body thoroughly to remove the shampoo.  Then [employer] and genitals (private parts) with your normal soap and rinse thoroughly to remove soap.  After that Use CHG Soap as you would any other liquid soap. You can apply CHG directly to the skin and wash gently with a scrungie or a clean washcloth.   Apply the CHG Soap to your body ONLY FROM THE NECK DOWN.  Do not use on open wounds or open sores. Avoid contact with your eyes, ears, mouth and genitals (private parts). Wash Face and genitals (private parts)  with your normal soap.   Wash thoroughly, paying special attention to the area where your surgery will be performed.  Thoroughly rinse your body with warm water from the neck down.  DO NOT shower/wash with your normal soap after using and rinsing off the CHG Soap.  Dennie Bible  yourself dry with a CLEAN TOWEL.  Wear CLEAN PAJAMAS to bed the night before surgery  Place CLEAN SHEETS on your bed the night before your surgery  DO NOT SLEEP WITH PETS.   Day of Surgery:  Take a shower with CHG soap. Wear Clean/Comfortable clothing the morning of surgery Do not apply any deodorants/lotions.   Remember to brush your teeth WITH YOUR REGULAR TOOTHPASTE.    If you received a COVID test during your pre-op visit, it is requested that you wear a mask when out in public, stay away from anyone that may not be feeling well, and notify your surgeon if you develop symptoms. If you have been in contact with anyone that has tested positive in the last 10 days, please notify your surgeon.    Please read over the following fact sheets that you were given.

## 2023-01-26 NOTE — Progress Notes (Addendum)
PCP - Gilmore Laroche Cardiologist - Denies  PPM/ICD - Denies  Chest x-ray - N/I EKG - 01/26/23 Stress Test - Denies ECHO - DEnies Cardiac Cath - Denies  Sleep Study - Denies  DM - Type II CBG at PAT appt 162 Fasting Blood Sugar - unaware Checks Blood Sugar __monthly  Last dose of GLP1 agonist-  No GLP1 instructions: N/A  Blood Thinner Instructions: N/A Aspirin Instructions: N/A   COVID TEST- N/A   Anesthesia review: Yes abnormal EKG  Patient denies shortness of breath, fever, cough and chest pain at PAT appointment   All instructions explained to the patient, with a verbal understanding of the material. Patient agrees to go over the instructions while at home for a better understanding. The opportunity to ask questions was provided.

## 2023-01-27 ENCOUNTER — Encounter (HOSPITAL_COMMUNITY): Payer: Self-pay

## 2023-01-27 ENCOUNTER — Other Ambulatory Visit: Payer: Self-pay

## 2023-01-27 DIAGNOSIS — M5416 Radiculopathy, lumbar region: Secondary | ICD-10-CM | POA: Insufficient documentation

## 2023-01-27 DIAGNOSIS — M4310 Spondylolisthesis, site unspecified: Secondary | ICD-10-CM | POA: Insufficient documentation

## 2023-01-27 DIAGNOSIS — M48061 Spinal stenosis, lumbar region without neurogenic claudication: Secondary | ICD-10-CM | POA: Insufficient documentation

## 2023-01-27 NOTE — Progress Notes (Addendum)
Anesthesia Chart Review:  Case: [id number] Date/Time: 02/05/23 0715   Procedures:      ALIF - L5-S1     ABDOMINAL EXPOSURE   Anesthesia type: General   Pre-op diagnosis: Spondylolisthesis   [facility] ROOM 21 / [facility] OR   Surgeons: Julio Sicks, MD; Cephus Shelling, MD       DISCUSSION: Patient is a 57 year old male scheduled for the above procedure.  History includes former smoker (current smokeless tobacco), post-operative nausea (Scopolamine helpful 2019), HTN, DM2, hypertriglyceridemia, fatty liver, gout, cholecystectomy (02/09/18)  He became established with new PCP Gilmore Laroche, FNP on 01/05/23. Previously he saw Dr. Dwana Melena. HTN and GERD controlled. A1c ordered to assess DM control. A1c 6.1% on 01/26/23. He in on Jardiance 25 mg daily, metformin 1000 mg Q AM and 500 mg Q PM, and glipizide XL 2.5 mg daily. He is no longer on Trulicity because it was not available. We discussed that if he gets access to Trulicity prior to surgery that he should not resume it until after surgery.   I called and spoke with him given EKG findings--although some of which are do to limb lead reversal. He denied chest pain, SOB, edema, palpitations, orthopnea, syncope, pre-syncope. He is currently out of work due to severe left leg cramping related to his back issues. 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 He remains off work given his left leg pain which limits his ability to work for prolonged hours. He says that he still is able to get what he needs to get done but has to modify timing some due to leg pain. He was recently able to mow his yard which includes riding and Conservator, museum/gallery. He has to rest after about 30 minutes if using the pushing mower due to his leg and not due to SOB or chest pain. Prior to leg pain he was able to go up at least 1-2 flights of stairs without issue. He denied  prior cardiac testing. His father died of an MI ~ age 50 and he had a grandfather who had a MI around age 22.   He said that he was diagnosed with fatty liver > 20 years ago and was referred to a Dr. Andrey Campanile with [facility] to evaluate for cirrhosis. To his understanding, liver biopsy did not show cirrhosis. He says he has had intermittent mild elevations in LFTs, but no significant elevations or issues with thrombocytopenia or ascites. No alcohol use is documented. (LFTs normal on 05/08/19, 02/19/20, 09/10/20.)  His PAT EKG appears consistent with arm limb lead reversal, so several "new" findings. I think many of the findings may be explained by the lead reversal. He said he was willing to repeat EKG either at [facility] or can come back to [facility] PAT. He prefers to try PCP office first for convenience. I communicated with Gilmore Laroche, FNP who is agreeable if patient can call to scheduled.    Addendum 02/02/23: EKG repeated today shows NSR at a rate of 90 with PVCs, otherwise normal.  He requests scopolamine patch but will discuss plan with anesthesiologist on the day of surgery since he/she may prefer other options. He says he did well with scopolamine in 2019.    VS: 01/26/2023: WT 79.8 KG, BP 114/76, HR 97   PROVIDERS: Gilmore Laroche, FNP is PCP    LABS:  Labs reviewed: Acceptable for surgery. (all labs ordered are listed, but only abnormal results are displayed)  Labs Reviewed  SURGICAL PCR SCREEN - Abnormal; Notable for the following components:      Result Value   Staphylococcus aureus POSITIVE (*)    All other components within normal limits  GLUCOSE, CAPILLARY - Abnormal; Notable for the following components:   Glucose-Capillary 162 (*)    All other components within normal limits  HEMOGLOBIN A1C - Abnormal; Notable for the following components:   Hgb A1c MFr Bld 6.1 (*)    All other components within normal limits  BASIC METABOLIC PANEL - Abnormal; Notable for the following  components:   Glucose, Bld 151 (*)    All other components within normal limits  CBC  TYPE AND SCREEN    IMAGES: MRI L-spine 05/15/22: IMPRESSION: 1. Disc degeneration most advanced at L5-S1 with a right foraminal protrusion which may contact the exiting right L5 nerve root. 2. Overall mild degenerative changes elsewhere in the lumbar spine as above significant spinal canal stenosis and no greater than mild neural foraminal stenosis. 3. No acute finding.   EKG: EKG 01/26/23:  SEE DISCUSSION. Unusual P axis, possible ectopic atrial rhythm with occasional Premature ventricular complexes Right superior axis deviation Inferior infarct , age undetermined Abnormal ECG When compared with ECG of 06-Feb-2018 13:58, all above findings ar enew Confirmed by Truett Mainland ([id number]) on 01/26/2023 6:49:04 PM  EKG 02/06/18: Sinus rhythm Borderline short PR interval   CV: N/A   Past Medical History:  Diagnosis Date   Complication of anesthesia    nausea; Scopalamine helpful 2019   Diabetes mellitus without complication (HCC)    Type II   Fatty liver    referred to [facility] Dr. Andrey Campanile ~ 1997, no cirrhosis then by biopsy   Gout    Hx of gallstones    Hypertension    Hypertriglyceridemia    Kidney stone     Past Surgical History:  Procedure Laterality Date   BILIARY STENT PLACEMENT  02/11/2018   Procedure: BILIARY STENT PLACEMENT;  Surgeon: Sherrilyn Rist, MD;  Location: [facility] ENDOSCOPY;  Service: Gastroenterology;;   CHOLECYSTECTOMY N/A 02/09/2018   Procedure: LAPAROSCOPIC CHOLECYSTECTOMY WITH [name];  Surgeon: Kinsinger, De Blanch, MD;  Location: [facility] OR;  Service: General;  Laterality: N/A;   COLONOSCOPY      remote past by Dr. Karilyn Cota 20 years ago   ENDOSCOPIC RETROGRADE CHOLANGIOPANCREATOGRAPHY (ERCP) WITH PROPOFOL N/A 02/08/2018   Procedure: ENDOSCOPIC RETROGRADE CHOLANGIOPANCREATOGRAPHY (ERCP) WITH PROPOFOL;  Surgeon: Sherrilyn Rist, MD;  Location: [facility] ENDOSCOPY;  Service:  Gastroenterology;  Laterality: N/A;   ENDOSCOPIC RETROGRADE CHOLANGIOPANCREATOGRAPHY (ERCP) WITH PROPOFOL N/A 02/11/2018   Procedure: ENDOSCOPIC RETROGRADE CHOLANGIOPANCREATOGRAPHY (ERCP) WITH PROPOFOL;  Surgeon: Sherrilyn Rist, MD;  Location: [facility] ENDOSCOPY;  Service: Gastroenterology;  Laterality: N/A;   ESOPHAGOGASTRODUODENOSCOPY     EGD 20 years ago   LIVER BIOPSY     REMOVAL OF STONES  02/11/2018   Procedure: REMOVAL OF STONES;  Surgeon: Sherrilyn Rist, MD;  Location: [facility] ENDOSCOPY;  Service: Gastroenterology;;   [name]  02/08/2018   Procedure: SPHINCTEROTOMY;  Surgeon: Sherrilyn Rist, MD;  Location: [facility] ENDOSCOPY;  Service: Gastroenterology;;   [name]  02/11/2018   Procedure: SPHINCTEROTOMY;  Surgeon: Sherrilyn Rist, MD;  Location: [facility] ENDOSCOPY;  Service: Gastroenterology;;   VASECTOMY      MEDICATIONS:  acetaminophen (TYLENOL) 500 MG tablet   allopurinol (ZYLOPRIM) 300 MG tablet  APPLE CIDER VINEGAR PO   Ascorbic Acid (VITAMIN C) 1000 MG tablet   Blood Glucose Monitoring Suppl MISC   CINNAMON PO   empagliflozin (JARDIANCE) 25 MG TABS tablet   gemfibrozil (LOPID) 600 MG tablet   glipiZIDE (GLUCOTROL XL) 2.5 MG 24 hr tablet   hyoscyamine (LEVBID) 0.375 MG 12 hr tablet   losartan (COZAAR) 50 MG tablet   Melatonin 10 MG CAPS   metFORMIN (GLUCOPHAGE-XR) 500 MG 24 hr tablet   Omega-3 Fatty Acids (OMEGA-3 FISH OIL PO)   ONE TOUCH ULTRA TEST test strip   pantoprazole (PROTONIX) 40 MG tablet   Phenylephrine-APAP-guaiFENesin (TYLENOL SINUS SEVERE PO)   pravastatin (PRAVACHOL) 40 MG tablet   Probiotic Product (PROBIOTIC PO)   sertraline (ZOLOFT) 50 MG tablet   SIMPLY SALINE NA   No current facility-administered medications for this encounter.   He was instructed to hold Jardiance 3 days prior to surgery.   Shonna Chock, PA-C Surgical Short Stay/Anesthesiology [facility] Phone [phone] [facility] Phone [phone] 01/27/2023 5:19 PM

## 2023-01-28 ENCOUNTER — Encounter: Payer: Self-pay | Admitting: Family Medicine

## 2023-02-02 ENCOUNTER — Encounter (HOSPITAL_COMMUNITY)
Admission: RE | Admit: 2023-02-02 | Discharge: 2023-02-02 | Disposition: A | Discharge status: Home / Self Care | Payer: 59 | Source: Ambulatory Visit

## 2023-02-02 DIAGNOSIS — Z01810 Encounter for preprocedural cardiovascular examination: Secondary | ICD-10-CM | POA: Insufficient documentation

## 2023-02-02 DIAGNOSIS — I10 Essential (primary) hypertension: Secondary | ICD-10-CM | POA: Insufficient documentation

## 2023-02-05 ENCOUNTER — Inpatient Hospital Stay (HOSPITAL_COMMUNITY): Payer: 59 | Admitting: Vascular Surgery

## 2023-02-05 ENCOUNTER — Inpatient Hospital Stay (HOSPITAL_COMMUNITY): Payer: 59

## 2023-02-05 ENCOUNTER — Encounter (HOSPITAL_COMMUNITY): Payer: Self-pay | Admitting: Neurosurgery

## 2023-02-05 ENCOUNTER — Encounter (HOSPITAL_COMMUNITY)
Admission: RE | Disposition: A | Discharge status: Home / Self Care | Payer: Self-pay | Source: Home / Self Care | Attending: Neurosurgery

## 2023-02-05 ENCOUNTER — Other Ambulatory Visit: Payer: Self-pay

## 2023-02-05 ENCOUNTER — Inpatient Hospital Stay (HOSPITAL_COMMUNITY)
Admission: RE | Admit: 2023-02-05 | Discharge: 2023-02-06 | DRG: 460 | Disposition: A | Discharge status: Home / Self Care | Payer: 59 | Attending: Neurosurgery | Admitting: Neurosurgery

## 2023-02-05 DIAGNOSIS — M5417 Radiculopathy, lumbosacral region: Secondary | ICD-10-CM | POA: Diagnosis present

## 2023-02-05 DIAGNOSIS — M4317 Spondylolisthesis, lumbosacral region: Secondary | ICD-10-CM | POA: Diagnosis present

## 2023-02-05 DIAGNOSIS — Z87891 Personal history of nicotine dependence: Secondary | ICD-10-CM | POA: Diagnosis not present

## 2023-02-05 DIAGNOSIS — E119 Type 2 diabetes mellitus without complications: Secondary | ICD-10-CM | POA: Diagnosis present

## 2023-02-05 DIAGNOSIS — M4310 Spondylolisthesis, site unspecified: Secondary | ICD-10-CM | POA: Diagnosis present

## 2023-02-05 DIAGNOSIS — M109 Gout, unspecified: Secondary | ICD-10-CM | POA: Diagnosis present

## 2023-02-05 DIAGNOSIS — Z9049 Acquired absence of other specified parts of digestive tract: Secondary | ICD-10-CM

## 2023-02-05 DIAGNOSIS — E781 Pure hyperglyceridemia: Secondary | ICD-10-CM | POA: Diagnosis present

## 2023-02-05 DIAGNOSIS — Z833 Family history of diabetes mellitus: Secondary | ICD-10-CM | POA: Diagnosis not present

## 2023-02-05 DIAGNOSIS — Z79899 Other long term (current) drug therapy: Secondary | ICD-10-CM | POA: Diagnosis not present

## 2023-02-05 DIAGNOSIS — M5137 Other intervertebral disc degeneration, lumbosacral region: Secondary | ICD-10-CM | POA: Diagnosis present

## 2023-02-05 DIAGNOSIS — Z7984 Long term (current) use of oral hypoglycemic drugs: Secondary | ICD-10-CM | POA: Diagnosis not present

## 2023-02-05 DIAGNOSIS — Z01810 Encounter for preprocedural cardiovascular examination: Secondary | ICD-10-CM

## 2023-02-05 DIAGNOSIS — E1165 Type 2 diabetes mellitus with hyperglycemia: Secondary | ICD-10-CM

## 2023-02-05 DIAGNOSIS — Z9852 Vasectomy status: Secondary | ICD-10-CM

## 2023-02-05 DIAGNOSIS — I10 Essential (primary) hypertension: Secondary | ICD-10-CM | POA: Diagnosis present

## 2023-02-05 DIAGNOSIS — K7460 Unspecified cirrhosis of liver: Secondary | ICD-10-CM | POA: Diagnosis present

## 2023-02-05 DIAGNOSIS — Z87442 Personal history of urinary calculi: Secondary | ICD-10-CM | POA: Diagnosis not present

## 2023-02-05 DIAGNOSIS — M5117 Intervertebral disc disorders with radiculopathy, lumbosacral region: Secondary | ICD-10-CM | POA: Diagnosis not present

## 2023-02-05 DIAGNOSIS — Z881 Allergy status to other antibiotic agents status: Secondary | ICD-10-CM

## 2023-02-05 DIAGNOSIS — Z888 Allergy status to other drugs, medicaments and biological substances status: Secondary | ICD-10-CM

## 2023-02-05 DIAGNOSIS — K760 Fatty (change of) liver, not elsewhere classified: Secondary | ICD-10-CM | POA: Diagnosis present

## 2023-02-05 DIAGNOSIS — M4807 Spinal stenosis, lumbosacral region: Secondary | ICD-10-CM | POA: Diagnosis present

## 2023-02-05 DIAGNOSIS — G8929 Other chronic pain: Secondary | ICD-10-CM | POA: Diagnosis present

## 2023-02-05 DIAGNOSIS — R9431 Abnormal electrocardiogram [ECG] [EKG]: Secondary | ICD-10-CM

## 2023-02-05 HISTORY — PX: ABDOMINAL EXPOSURE: SHX5708

## 2023-02-05 HISTORY — PX: ANTERIOR LUMBAR FUSION: SHX1170

## 2023-02-05 LAB — GLUCOSE, CAPILLARY
Glucose-Capillary: 106 mg/dL — ABNORMAL HIGH (ref 70–99)
Glucose-Capillary: 138 mg/dL — ABNORMAL HIGH (ref 70–99)
Glucose-Capillary: 192 mg/dL — ABNORMAL HIGH (ref 70–99)

## 2023-02-05 LAB — ABO/RH: ABO/RH(D): A POS

## 2023-02-05 SURGERY — ANTERIOR LUMBAR FUSION 1 LEVEL
Anesthesia: General

## 2023-02-05 MED ORDER — FENTANYL CITRATE (PF) 250 MCG/5ML IJ SOLN
INTRAMUSCULAR | Status: DC | PRN
  Administered 2023-02-05: 150 ug via INTRAVENOUS
  Administered 2023-02-05 (×2): 50 ug via INTRAVENOUS

## 2023-02-05 MED ORDER — MIDAZOLAM HCL 2 MG/2ML IJ SOLN
INTRAMUSCULAR | Status: AC
  Filled 2023-02-05: qty 2

## 2023-02-05 MED ORDER — THROMBIN 20000 UNITS EX SOLR: CUTANEOUS | Status: DC | PRN

## 2023-02-05 MED ORDER — HYDROMORPHONE HCL 1 MG/ML IJ SOLN
INTRAMUSCULAR | Status: DC | PRN
  Administered 2023-02-05: .5 mg via INTRAVENOUS

## 2023-02-05 MED ORDER — CHLORHEXIDINE GLUCONATE CLOTH 2 % EX PADS: 6.0000 | MEDICATED_PAD | Freq: Once | CUTANEOUS | Status: DC

## 2023-02-05 MED ORDER — DEXAMETHASONE SODIUM PHOSPHATE 10 MG/ML IJ SOLN
INTRAMUSCULAR | Status: DC | PRN
  Administered 2023-02-05: 10 mg via INTRAVENOUS

## 2023-02-05 MED ORDER — ACETAMINOPHEN 10 MG/ML IV SOLN
INTRAVENOUS | Status: DC | PRN
  Administered 2023-02-05: 1000 mg via INTRAVENOUS

## 2023-02-05 MED ORDER — CEFAZOLIN SODIUM-DEXTROSE 1-4 GM/50ML-% IV SOLN
1.0000 g | Freq: Three times a day (TID) | INTRAVENOUS | Status: AC
  Administered 2023-02-05 (×2): 1 g via INTRAVENOUS
  Filled 2023-02-05 (×2): qty 50

## 2023-02-05 MED ORDER — ACETAMINOPHEN 650 MG RE SUPP: 650.0000 mg | RECTAL | Status: DC | PRN

## 2023-02-05 MED ORDER — BUPIVACAINE HCL (PF) 0.25 % IJ SOLN
INTRAMUSCULAR | Status: DC | PRN
  Administered 2023-02-05: 20 mL

## 2023-02-05 MED ORDER — HYDROMORPHONE HCL 1 MG/ML IJ SOLN
INTRAMUSCULAR | Status: AC
  Filled 2023-02-05: qty 0.5

## 2023-02-05 MED ORDER — LACTATED RINGERS IV SOLN: INTRAVENOUS | Status: DC | PRN

## 2023-02-05 MED ORDER — METFORMIN HCL ER 500 MG PO TB24
500.0000 mg | ORAL_TABLET | Freq: Every day | ORAL | Status: DC
  Administered 2023-02-05: 500 mg via ORAL
  Filled 2023-02-05: qty 1

## 2023-02-05 MED ORDER — MELATONIN 5 MG PO TABS: 10.0000 mg | ORAL_TABLET | Freq: Every evening | ORAL | Status: DC | PRN

## 2023-02-05 MED ORDER — PRAVASTATIN SODIUM 40 MG PO TABS
40.0000 mg | ORAL_TABLET | Freq: Every day | ORAL | Status: DC
  Administered 2023-02-05: 40 mg via ORAL
  Filled 2023-02-05: qty 1

## 2023-02-05 MED ORDER — OXYCODONE HCL 5 MG PO TABS
ORAL_TABLET | ORAL | Status: AC
  Filled 2023-02-05: qty 2

## 2023-02-05 MED ORDER — POLYETHYLENE GLYCOL 3350 17 G PO PACK: 17.0000 g | PACK | Freq: Every day | ORAL | Status: DC | PRN

## 2023-02-05 MED ORDER — RISAQUAD PO CAPS: 1.0000 | ORAL_CAPSULE | Freq: Every day | ORAL | Status: DC

## 2023-02-05 MED ORDER — 0.9 % SODIUM CHLORIDE (POUR BTL) OPTIME
TOPICAL | Status: DC | PRN
  Administered 2023-02-05 (×2): 1000 mL

## 2023-02-05 MED ORDER — VANCOMYCIN HCL 1000 MG IV SOLR
INTRAVENOUS | Status: DC | PRN
  Administered 2023-02-05: 1000 mg via INTRAVENOUS

## 2023-02-05 MED ORDER — PROPOFOL 500 MG/50ML IV EMUL
INTRAVENOUS | Status: DC | PRN
  Administered 2023-02-05: 25 ug/kg/min via INTRAVENOUS

## 2023-02-05 MED ORDER — ORAL CARE MOUTH RINSE: 15.0000 mL | Freq: Once | OROMUCOSAL | Status: AC

## 2023-02-05 MED ORDER — PROPOFOL 10 MG/ML IV BOLUS
INTRAVENOUS | Status: AC
  Filled 2023-02-05: qty 20

## 2023-02-05 MED ORDER — ROCURONIUM BROMIDE 10 MG/ML (PF) SYRINGE
PREFILLED_SYRINGE | INTRAVENOUS | Status: AC
  Filled 2023-02-05: qty 10

## 2023-02-05 MED ORDER — PHENYLEPHRINE HCL-NACL 20-0.9 MG/250ML-% IV SOLN
INTRAVENOUS | Status: DC | PRN
  Administered 2023-02-05: 25 ug/min via INTRAVENOUS

## 2023-02-05 MED ORDER — SUGAMMADEX SODIUM 200 MG/2ML IV SOLN
INTRAVENOUS | Status: DC | PRN
  Administered 2023-02-05: 300 mg via INTRAVENOUS

## 2023-02-05 MED ORDER — VITAMIN C 500 MG PO TABS
1000.0000 mg | ORAL_TABLET | Freq: Two times a day (BID) | ORAL | Status: DC
  Administered 2023-02-05: 1000 mg via ORAL
  Filled 2023-02-05: qty 2

## 2023-02-05 MED ORDER — METFORMIN HCL ER 500 MG PO TB24
1000.0000 mg | ORAL_TABLET | Freq: Every day | ORAL | Status: DC
  Administered 2023-02-06: 1000 mg via ORAL
  Filled 2023-02-05: qty 2

## 2023-02-05 MED ORDER — PROPOFOL 1000 MG/100ML IV EMUL
INTRAVENOUS | Status: AC
  Filled 2023-02-05: qty 200

## 2023-02-05 MED ORDER — VANCOMYCIN HCL IN DEXTROSE 1-5 GM/200ML-% IV SOLN
1000.0000 mg | INTRAVENOUS | Status: DC
  Filled 2023-02-05: qty 200

## 2023-02-05 MED ORDER — DEXAMETHASONE SODIUM PHOSPHATE 10 MG/ML IJ SOLN
INTRAMUSCULAR | Status: AC
  Filled 2023-02-05: qty 1

## 2023-02-05 MED ORDER — FENTANYL CITRATE (PF) 100 MCG/2ML IJ SOLN
INTRAMUSCULAR | Status: AC
  Filled 2023-02-05: qty 2

## 2023-02-05 MED ORDER — OXYCODONE HCL 5 MG PO TABS: 5.0000 mg | ORAL_TABLET | Freq: Once | ORAL | Status: DC | PRN

## 2023-02-05 MED ORDER — KETAMINE HCL 50 MG/5ML IJ SOSY
PREFILLED_SYRINGE | INTRAMUSCULAR | Status: AC
  Filled 2023-02-05: qty 5

## 2023-02-05 MED ORDER — PHENYLEPHRINE 80 MCG/ML (10ML) SYRINGE FOR IV PUSH (FOR BLOOD PRESSURE SUPPORT)
PREFILLED_SYRINGE | INTRAVENOUS | Status: AC
  Filled 2023-02-05: qty 10

## 2023-02-05 MED ORDER — MENTHOL 3 MG MT LOZG: 1.0000 | LOZENGE | OROMUCOSAL | Status: DC | PRN

## 2023-02-05 MED ORDER — SODIUM CHLORIDE 0.9% FLUSH: 3.0000 mL | INTRAVENOUS | Status: DC | PRN

## 2023-02-05 MED ORDER — FENTANYL CITRATE (PF) 100 MCG/2ML IJ SOLN
25.0000 ug | INTRAMUSCULAR | Status: DC | PRN
  Administered 2023-02-05: 50 ug via INTRAVENOUS

## 2023-02-05 MED ORDER — ACETAMINOPHEN 10 MG/ML IV SOLN: 1000.0000 mg | Freq: Once | INTRAVENOUS | Status: DC | PRN

## 2023-02-05 MED ORDER — LIDOCAINE 2% (20 MG/ML) 5 ML SYRINGE
INTRAMUSCULAR | Status: DC | PRN
  Administered 2023-02-05: 60 mg via INTRAVENOUS

## 2023-02-05 MED ORDER — THROMBIN 20000 UNITS EX SOLR
CUTANEOUS | Status: AC
  Filled 2023-02-05: qty 20000

## 2023-02-05 MED ORDER — SERTRALINE HCL 50 MG PO TABS: 50.0000 mg | ORAL_TABLET | Freq: Every day | ORAL | Status: DC

## 2023-02-05 MED ORDER — DEXMEDETOMIDINE HCL IN NACL 80 MCG/20ML IV SOLN
INTRAVENOUS | Status: AC
  Filled 2023-02-05: qty 20

## 2023-02-05 MED ORDER — EPHEDRINE 5 MG/ML INJ
INTRAVENOUS | Status: AC
  Filled 2023-02-05: qty 5

## 2023-02-05 MED ORDER — GLIPIZIDE ER 2.5 MG PO TB24
2.5000 mg | ORAL_TABLET | Freq: Every day | ORAL | Status: DC
  Filled 2023-02-05: qty 1

## 2023-02-05 MED ORDER — FLEET ENEMA 7-19 GM/118ML RE ENEM: 1.0000 | ENEMA | Freq: Once | RECTAL | Status: DC | PRN

## 2023-02-05 MED ORDER — HYOSCYAMINE SULFATE ER 0.375 MG PO TB12
0.3750 mg | ORAL_TABLET | Freq: Two times a day (BID) | ORAL | Status: DC
  Administered 2023-02-05: 0.375 mg via ORAL
  Filled 2023-02-05 (×3): qty 1

## 2023-02-05 MED ORDER — SODIUM CHLORIDE 0.9% FLUSH
3.0000 mL | Freq: Two times a day (BID) | INTRAVENOUS | Status: DC
  Administered 2023-02-05 (×2): 3 mL via INTRAVENOUS

## 2023-02-05 MED ORDER — ACETAMINOPHEN 500 MG PO TABS: 1000.0000 mg | ORAL_TABLET | Freq: Once | ORAL | Status: DC | PRN

## 2023-02-05 MED ORDER — KETAMINE HCL 10 MG/ML IJ SOLN
INTRAMUSCULAR | Status: DC | PRN
  Administered 2023-02-05: 10 mg via INTRAVENOUS
  Administered 2023-02-05: 20 mg via INTRAVENOUS
  Administered 2023-02-05 (×2): 10 mg via INTRAVENOUS

## 2023-02-05 MED ORDER — ONDANSETRON HCL 4 MG PO TABS: 4.0000 mg | ORAL_TABLET | Freq: Four times a day (QID) | ORAL | Status: DC | PRN

## 2023-02-05 MED ORDER — ROCURONIUM BROMIDE 10 MG/ML (PF) SYRINGE
PREFILLED_SYRINGE | INTRAVENOUS | Status: DC | PRN
  Administered 2023-02-05: 70 mg via INTRAVENOUS
  Administered 2023-02-05: 30 mg via INTRAVENOUS

## 2023-02-05 MED ORDER — ONDANSETRON HCL 4 MG/2ML IJ SOLN
4.0000 mg | Freq: Four times a day (QID) | INTRAMUSCULAR | Status: DC | PRN
  Administered 2023-02-05: 4 mg via INTRAVENOUS
  Filled 2023-02-05: qty 2

## 2023-02-05 MED ORDER — ONDANSETRON HCL 4 MG/2ML IJ SOLN
INTRAMUSCULAR | Status: AC
  Filled 2023-02-05: qty 2

## 2023-02-05 MED ORDER — ACETAMINOPHEN 325 MG PO TABS: 650.0000 mg | ORAL_TABLET | ORAL | Status: DC | PRN

## 2023-02-05 MED ORDER — PHENOL 1.4 % MT LIQD: 1.0000 | OROMUCOSAL | Status: DC | PRN

## 2023-02-05 MED ORDER — LOSARTAN POTASSIUM 50 MG PO TABS
50.0000 mg | ORAL_TABLET | Freq: Every day | ORAL | Status: DC
  Administered 2023-02-05: 50 mg via ORAL
  Filled 2023-02-05: qty 1

## 2023-02-05 MED ORDER — HYDROMORPHONE HCL 1 MG/ML IJ SOLN
1.0000 mg | INTRAMUSCULAR | Status: DC | PRN
  Administered 2023-02-05: 1 mg via INTRAVENOUS
  Filled 2023-02-05: qty 1

## 2023-02-05 MED ORDER — INSULIN ASPART 100 UNIT/ML IJ SOLN: 0.0000 [IU] | INTRAMUSCULAR | Status: DC | PRN

## 2023-02-05 MED ORDER — EMPAGLIFLOZIN 25 MG PO TABS
25.0000 mg | ORAL_TABLET | Freq: Every day | ORAL | Status: DC
  Administered 2023-02-05 – 2023-02-06 (×2): 25 mg via ORAL
  Filled 2023-02-05 (×2): qty 1

## 2023-02-05 MED ORDER — LIDOCAINE 2% (20 MG/ML) 5 ML SYRINGE
INTRAMUSCULAR | Status: AC
  Filled 2023-02-05: qty 5

## 2023-02-05 MED ORDER — PROBIOTIC 250 MG PO CAPS: ORAL_CAPSULE | Freq: Every day | ORAL | Status: DC

## 2023-02-05 MED ORDER — OXYCODONE HCL 5 MG/5ML PO SOLN: 5.0000 mg | Freq: Once | ORAL | Status: DC | PRN

## 2023-02-05 MED ORDER — ALLOPURINOL 300 MG PO TABS
300.0000 mg | ORAL_TABLET | Freq: Every day | ORAL | Status: DC
  Administered 2023-02-05: 300 mg via ORAL
  Filled 2023-02-05: qty 1

## 2023-02-05 MED ORDER — BISACODYL 10 MG RE SUPP: 10.0000 mg | Freq: Every day | RECTAL | Status: DC | PRN

## 2023-02-05 MED ORDER — FENTANYL CITRATE (PF) 250 MCG/5ML IJ SOLN
INTRAMUSCULAR | Status: AC
  Filled 2023-02-05: qty 5

## 2023-02-05 MED ORDER — BUPIVACAINE HCL (PF) 0.25 % IJ SOLN
INTRAMUSCULAR | Status: AC
  Filled 2023-02-05: qty 30

## 2023-02-05 MED ORDER — DEXMEDETOMIDINE HCL IN NACL 200 MCG/50ML IV SOLN
INTRAVENOUS | Status: DC | PRN
  Administered 2023-02-05: 8 ug via INTRAVENOUS

## 2023-02-05 MED ORDER — ACETAMINOPHEN 160 MG/5ML PO SOLN: 1000.0000 mg | Freq: Once | ORAL | Status: DC | PRN

## 2023-02-05 MED ORDER — GEMFIBROZIL 600 MG PO TABS
600.0000 mg | ORAL_TABLET | Freq: Every day | ORAL | Status: DC
  Administered 2023-02-05: 600 mg via ORAL
  Filled 2023-02-05 (×2): qty 1

## 2023-02-05 MED ORDER — PANTOPRAZOLE SODIUM 40 MG PO TBEC: 40.0000 mg | DELAYED_RELEASE_TABLET | Freq: Every day | ORAL | Status: DC

## 2023-02-05 MED ORDER — DIAZEPAM 5 MG PO TABS
5.0000 mg | ORAL_TABLET | Freq: Four times a day (QID) | ORAL | Status: DC | PRN
  Administered 2023-02-05: 5 mg via ORAL
  Filled 2023-02-05: qty 1

## 2023-02-05 MED ORDER — LACTATED RINGERS IV SOLN: INTRAVENOUS | Status: DC

## 2023-02-05 MED ORDER — MIDAZOLAM HCL 2 MG/2ML IJ SOLN
INTRAMUSCULAR | Status: DC | PRN
  Administered 2023-02-05: 2 mg via INTRAVENOUS

## 2023-02-05 MED ORDER — PROPOFOL 10 MG/ML IV BOLUS
INTRAVENOUS | Status: DC | PRN
  Administered 2023-02-05: 140 mg via INTRAVENOUS

## 2023-02-05 MED ORDER — CHLORHEXIDINE GLUCONATE 0.12 % MT SOLN
15.0000 mL | Freq: Once | OROMUCOSAL | Status: AC
  Administered 2023-02-05: 15 mL via OROMUCOSAL
  Filled 2023-02-05: qty 15

## 2023-02-05 MED ORDER — ACETAMINOPHEN 10 MG/ML IV SOLN
INTRAVENOUS | Status: AC
  Filled 2023-02-05: qty 100

## 2023-02-05 MED ORDER — SODIUM CHLORIDE 0.9 % IV SOLN
250.0000 mL | INTRAVENOUS | Status: DC
  Administered 2023-02-05: 250 mL via INTRAVENOUS

## 2023-02-05 MED ORDER — CEFAZOLIN SODIUM-DEXTROSE 1-4 GM/50ML-% IV SOLN
INTRAVENOUS | Status: AC
  Filled 2023-02-05: qty 50

## 2023-02-05 MED ORDER — OXYCODONE HCL 5 MG PO TABS
10.0000 mg | ORAL_TABLET | ORAL | Status: DC | PRN
  Administered 2023-02-05 (×2): 10 mg via ORAL
  Filled 2023-02-05: qty 2

## 2023-02-05 MED ORDER — HYDROCODONE-ACETAMINOPHEN 10-325 MG PO TABS: 1.0000 | ORAL_TABLET | ORAL | Status: DC | PRN

## 2023-02-05 SURGICAL SUPPLY — 79 items
ADH SKN CLS APL DERMABOND .7 (GAUZE/BANDAGES/DRESSINGS) ×1
ANCH SPNL 27 LMBR MIS (Anchor) ×3 IMPLANT
ANCHOR LUMBAR 27 (Anchor) IMPLANT
APL SKNCLS STERI-STRIP NONHPOA (GAUZE/BANDAGES/DRESSINGS)
APPLIER CLIP 11 MED OPEN (CLIP) ×2
APR CLP MED 11 20 MLT OPN (CLIP) ×2
BAG COUNTER SPONGE SURGICOUNT (BAG) ×2 IMPLANT
BAG DECANTER FOR FLEXI CONT (MISCELLANEOUS) ×1 IMPLANT
BAG SPNG CNTER NS LX DISP (BAG) ×2
BENZOIN TINCTURE PRP APPL 2/3 (GAUZE/BANDAGES/DRESSINGS) IMPLANT
BUR MATCHSTICK NEURO 3.0 LAGG (BURR) IMPLANT
CANISTER SUCT 3000ML PPV (MISCELLANEOUS) ×1 IMPLANT
CLIP APPLIE 11 MED OPEN (CLIP) ×2 IMPLANT
CLIP LIGATING EXTRA MED SLVR (CLIP) IMPLANT
DERMABOND ADVANCED .7 DNX12 (GAUZE/BANDAGES/DRESSINGS) IMPLANT
DRAPE C-ARM 42X72 X-RAY (DRAPES) ×3 IMPLANT
DRAPE LAPAROTOMY 100X72X124 (DRAPES) ×1 IMPLANT
DRSG OPSITE POSTOP 4X8 (GAUZE/BANDAGES/DRESSINGS) IMPLANT
ELECT BLADE 4.0 EZ CLEAN MEGAD (MISCELLANEOUS) ×1
ELECT BLADE 6.5 EXT (BLADE) ×1 IMPLANT
ELECT REM PT RETURN 9FT ADLT (ELECTROSURGICAL) ×1
ELECTRODE BLDE 4.0 EZ CLN MEGD (MISCELLANEOUS) ×1 IMPLANT
ELECTRODE REM PT RTRN 9FT ADLT (ELECTROSURGICAL) ×1 IMPLANT
GAUZE 4X4 16PLY ~~LOC~~+RFID DBL (SPONGE) IMPLANT
GAUZE SPONGE 4X4 12PLY STRL (GAUZE/BANDAGES/DRESSINGS) ×1 IMPLANT
GLOVE BIO SURGEON STRL SZ7.5 (GLOVE) ×1 IMPLANT
GLOVE BIOGEL PI IND STRL 8 (GLOVE) ×1 IMPLANT
GLOVE ECLIPSE 9.0 STRL (GLOVE) ×1 IMPLANT
GLOVE EXAM NITRILE XL STR (GLOVE) IMPLANT
GOWN STRL REUS W/ TWL LRG LVL3 (GOWN DISPOSABLE) ×1 IMPLANT
GOWN STRL REUS W/ TWL XL LVL3 (GOWN DISPOSABLE) ×2 IMPLANT
GOWN STRL REUS W/TWL 2XL LVL3 (GOWN DISPOSABLE) IMPLANT
GOWN STRL REUS W/TWL LRG LVL3 (GOWN DISPOSABLE) ×1
GOWN STRL REUS W/TWL XL LVL3 (GOWN DISPOSABLE) ×4
GRAFT TRINITY ELITE LGE HUMAN (Tissue) IMPLANT
GUIDE TRIPLE BARREL ANCH 15 (ORTHOPEDIC DISPOSABLE SUPPLIES) IMPLANT
INSERT FOGARTY 61MM (MISCELLANEOUS) IMPLANT
INSERT FOGARTY SM (MISCELLANEOUS) IMPLANT
KIT BASIN OR (CUSTOM PROCEDURE TRAY) ×1 IMPLANT
KIT INFUSE X SMALL 1.4CC (Orthopedic Implant) IMPLANT
NDL SPNL 18GX3.5 QUINCKE PK (NEEDLE) ×1 IMPLANT
NEEDLE HYPO 22GX1.5 SAFETY (NEEDLE) ×1 IMPLANT
NEEDLE SPNL 18GX3.5 QUINCKE PK (NEEDLE) ×1 IMPLANT
NS IRRIG 1000ML POUR BTL (IV SOLUTION) ×1 IMPLANT
PACK LAMINECTOMY NEURO (CUSTOM PROCEDURE TRAY) ×1 IMPLANT
PAD ARMBOARD 7.5X6 YLW CONV (MISCELLANEOUS) ×2 IMPLANT
ROD SPINAL THRD DISP (MISCELLANEOUS) IMPLANT
SOL ELECTROSURG ANTI STICK (MISCELLANEOUS)
SOLUTION ELECTROSURG ANTI STCK (MISCELLANEOUS) ×1 IMPLANT
SPACER HEDRON IA 29X39 15 15D (Spacer) IMPLANT
SPONGE INTESTINAL PEANUT (DISPOSABLE) ×3 IMPLANT
SPONGE SURGIFOAM ABS GEL 100 (HEMOSTASIS) ×2 IMPLANT
SPONGE T-LAP 18X18 ~~LOC~~+RFID (SPONGE) ×1 IMPLANT
STAPLER VISISTAT 35W (STAPLE) ×1 IMPLANT
STRIP CLOSURE SKIN 1/2X4 (GAUZE/BANDAGES/DRESSINGS) IMPLANT
SUT PDS AB 1 CTX 36 (SUTURE) ×1 IMPLANT
SUT PROLENE 4 0 RB 1 (SUTURE)
SUT PROLENE 4-0 RB1 .5 CRCL 36 (SUTURE) IMPLANT
SUT PROLENE 5 0 CC1 (SUTURE) IMPLANT
SUT PROLENE 6 0 C 1 30 (SUTURE) IMPLANT
SUT PROLENE 6 0 CC (SUTURE) IMPLANT
SUT SILK 0 TIES 10X30 (SUTURE) IMPLANT
SUT SILK 2 0 TIES 10X30 (SUTURE) ×2 IMPLANT
SUT SILK 2 0SH CR/8 30 (SUTURE) IMPLANT
SUT SILK 3 0 SH CR/8 (SUTURE) IMPLANT
SUT SILK 3 0 TIES 17X18 (SUTURE)
SUT SILK 3 0SH CR/8 30 (SUTURE) IMPLANT
SUT SILK 3-0 18XBRD TIE BLK (SUTURE) IMPLANT
SUT VIC AB 0 CT1 18XCR BRD8 (SUTURE) ×1 IMPLANT
SUT VIC AB 0 CT1 27 (SUTURE) ×2
SUT VIC AB 0 CT1 27XBRD ANBCTR (SUTURE) ×2 IMPLANT
SUT VIC AB 0 CT1 27XBRD ANTBC (SUTURE) IMPLANT
SUT VIC AB 0 CT1 8-18 (SUTURE) ×1
SUT VIC AB 2-0 CT1 18 (SUTURE) ×1 IMPLANT
SUT VIC AB 3-0 SH 8-18 (SUTURE) ×1 IMPLANT
TOWEL GREEN STERILE (TOWEL DISPOSABLE) ×2 IMPLANT
TOWEL GREEN STERILE FF (TOWEL DISPOSABLE) ×1 IMPLANT
TRAY FOLEY MTR SLVR 16FR STAT (SET/KITS/TRAYS/PACK) ×1 IMPLANT
WATER STERILE IRR 1000ML POUR (IV SOLUTION) ×1 IMPLANT

## 2023-02-05 NOTE — H&P (Signed)
Gregory Thompson is an 57 y.o. male.   Chief Complaint: Pain HPI: 57 year old male with chronic and progressively worsening lumbar pain with intermittent lower extremity symptoms left greater than right presents for L5-S1 anterior lumbar to body fusion in hopes improving his symptoms.  Workup demonstrates evidence of marked to space collapse with degenerative retrolisthesis and foraminal stenosis at L5-S1.  Past Medical History:  Diagnosis Date   Complication of anesthesia    nausea; Scopalamine helpful 2019   Diabetes mellitus without complication (HCC)    Type II   Fatty liver    referred to [facility] Dr. Andrey Campanile ~ 1997, no cirrhosis then by biopsy   Gout    Hx of gallstones    Hypertension    Hypertriglyceridemia    Kidney stone     Past Surgical History:  Procedure Laterality Date   BILIARY STENT PLACEMENT  02/11/2018   Procedure: BILIARY STENT PLACEMENT;  Surgeon: Sherrilyn Rist, MD;  Location: [facility] ENDOSCOPY;  Service: Gastroenterology;;   CHOLECYSTECTOMY N/A 02/09/2018   Procedure: LAPAROSCOPIC CHOLECYSTECTOMY WITH [name];  Surgeon: Kinsinger, De Blanch, MD;  Location[facility] OR;  Service: General;  Laterality: N/A;   COLONOSCOPY      remote past by Dr. Karilyn Cota 20 years ago   ENDOSCOPIC RETROGRADE CHOLANGIOPANCREATOGRAPHY (ERCP) WITH PROPOFOL N/A 02/08/2018   Procedure: ENDOSCOPIC RETROGRADE CHOLANGIOPANCREATOGRAPHY (ERCP) WITH PROPOFOL;  Surgeon: Sherrilyn Rist, MD;  Location: [facility] ENDOSCOPY;  Service: Gastroenterology;  Laterality: N/A;   ENDOSCOPIC RETROGRADE CHOLANGIOPANCREATOGRAPHY (ERCP) WITH PROPOFOL N/A 02/11/2018   Procedure: ENDOSCOPIC RETROGRADE CHOLANGIOPANCREATOGRAPHY (ERCP) WITH PROPOFOL;  Surgeon: Sherrilyn Rist, MD;  Location: [facility] ENDOSCOPY;  Service: Gastroenterology;  Laterality: N/A;   ESOPHAGOGASTRODUODENOSCOPY     EGD 20 years ago   LIVER BIOPSY     REMOVAL OF STONES  02/11/2018   Procedure: REMOVAL OF STONES;  Surgeon: Sherrilyn Rist, MD;  Location: [facility]  ENDOSCOPY;  Service: Gastroenterology;;   [name]  02/08/2018   Procedure: SPHINCTEROTOMY;  Surgeon: Sherrilyn Rist, MD;  Location: [facility] ENDOSCOPY;  Service: Gastroenterology;;   [name]  02/11/2018   Procedure: SPHINCTEROTOMY;  Surgeon: Sherrilyn Rist, MD;  Location: [facility] ENDOSCOPY;  Service: Gastroenterology;;   VASECTOMY      Family History  Problem Relation Age of Onset   Hyperlipidemia Father    Diabetes Paternal Grandfather    Colon cancer Neg Hx    Colon polyps Neg Hx    Liver disease Neg Hx    Social History:  reports that he has quit smoking. His smoking use included cigarettes. His smokeless tobacco use includes chew. He reports that he does not drink alcohol and does not use drugs.  Allergies:  Allergies  Allergen Reactions   Eluxadoline Other (See Comments)    Low blood sugar    Azithromycin     Abdominal pain   Cefzil [Cefprozil]     Abdominal pain   Lisinopril Cough   Penicillins     Medications Prior to Admission  Medication Sig Dispense Refill   acetaminophen (TYLENOL) 500 MG tablet Take 1,000 mg by mouth every 6 (six) hours as needed for moderate pain.     allopurinol (ZYLOPRIM) 300 MG tablet TAKE 1 TABLET(300 MG) BY MOUTH DAILY 90 tablet 1   APPLE CIDER VINEGAR PO Take 1 capsule by mouth 2 (two) times daily.     Ascorbic Acid (VITAMIN C) 1000 MG tablet Take 1,000 mg by mouth 2 (two) times daily.  CINNAMON PO Take 1,000 mg by mouth 2 (two) times daily.     empagliflozin (JARDIANCE) 25 MG TABS tablet Take one tablet po daily 90 tablet 1   gemfibrozil (LOPID) 600 MG tablet TAKE 1 TABLET BY MOUTH EVERY OTHER DAY (Patient taking differently: Take 600 mg by mouth daily.) 45 tablet 0   glipiZIDE (GLUCOTROL XL) 2.5 MG 24 hr tablet Take 2.5 mg by mouth daily.     hyoscyamine (LEVBID) 0.375 MG 12 hr tablet Take 1 tablet (0.375 mg total) by mouth every 12 (twelve) hours as needed. (Patient taking differently: Take 0.375 mg by mouth 2 (two) times  daily.) 180 tablet 1   losartan (COZAAR) 50 MG tablet TAKE 1 TABLET(50 MG) BY MOUTH DAILY 90 tablet 1   Melatonin 10 MG CAPS Take 10 mg by mouth at bedtime as needed (sleep).     metFORMIN (GLUCOPHAGE-XR) 500 MG 24 hr tablet Take 2 tablets by mouth daily (Patient taking differently: Take 500-1,000 mg by mouth See admin instructions. Take 1000 mg in the morning and 500 mg in the evening) 60 tablet 5   Omega-3 Fatty Acids (OMEGA-3 FISH OIL PO) Take 2,000 mg by mouth daily.     pantoprazole (PROTONIX) 40 MG tablet TAKE 1 TABLET(40 MG) BY MOUTH TWICE DAILY (Patient taking differently: Take 40 mg by mouth daily.) 180 tablet 1   Phenylephrine-APAP-guaiFENesin (TYLENOL SINUS SEVERE PO) Take 2 tablets by mouth daily as needed (congestion).     pravastatin (PRAVACHOL) 40 MG tablet Take 40 mg by mouth daily.     Probiotic Product (PROBIOTIC PO) Take 1 capsule by mouth daily.     sertraline (ZOLOFT) 50 MG tablet 1 qd 30 tablet 5   SIMPLY SALINE NA Place 1 spray into the nose daily as needed (congestion).     Blood Glucose Monitoring Suppl MISC Glucose testing supplies-test once a day 50 each 11   Dulaglutide (TRULICITY) 1.5 MG/0.5ML SOPN      HYDROcodone-acetaminophen (NORCO/VICODIN) 5-325 MG tablet Take by mouth.     ONE TOUCH ULTRA TEST test strip TEST ONCE DAILY AS DIRECTED 50 each 4    Results for orders placed or performed during the hospital encounter of 02/05/23 (from the past 48 hour(s))  Glucose, capillary     Status: Abnormal   Collection Time: 02/05/23  6:11 AM  Result Value Ref Range   Glucose-Capillary 106 (H) 70 - 99 mg/dL    Comment: Glucose reference range applies only to samples taken after fasting for at least 8 hours.  ABO/Rh     Status: None (Preliminary result)   Collection Time: 02/05/23  7:00 AM  Result Value Ref Range   ABO/RH(D) PENDING    No results found.  Pertinent items noted in HPI and remainder of comprehensive ROS otherwise negative.  Blood pressure 125/88, pulse  (!) 101, temperature 98.6 F (37 C), temperature source Other (Comment), resp. rate 18, height 5\' 8"  (1.727 m), weight 79.8 kg, SpO2 95 %.  Patient is awake and alert.  He is oriented and appropriate.  Speech is fluent.  Judgment insight are intact.  Cranial nerve function normal bilaterally motor examination reveals intact motor strength bilaterally sensory examination with some decrease sensation pinprick light touch in his left L5 dermatome.  Deep tendon reflexes normal active symptoms Achilles reflexes are absent.  Gait antalgic posture is normal.  Examination head ears eyes nose throat is unremarkable chest and abdomen are benign.  Extremities are free of major deformity. Assessment/Plan L5-S1 degenerative retrolisthesis  with foraminal stenosis and chronic back pain with radiculopathy.  Plan L5-S1 anterior lumbar MRI fusion utilizing interbody cage, morselized allograft, bone morphogenic protein, and anterior fixation.  Risks and benefits been explained.  Patient wishes to proceed.  Kathaleen Maser Jamien Casanova 02/05/2023, 7:48 AM

## 2023-02-05 NOTE — H&P (Signed)
History and Physical Interval Note:  02/05/2023 7:19 AM  Gregory Thompson  has presented today for surgery, with the diagnosis of Spondylolisthesis.  The various methods of treatment have been discussed with the patient and family. After consideration of risks, benefits and other options for treatment, the patient has consented to  Procedure(s): ALIF - L5-S1 (N/A) ABDOMINAL EXPOSURE (N/A) as a surgical intervention.  The patient's history has been reviewed, patient examined, no change in status, stable for surgery.  I have reviewed the patient's chart and labs.  Questions were answered to the patient's satisfaction.    Abdominal exposure for L5-S1 ALIF  Gregory Thompson     Patient name: Gregory Thompson       MRN: [id number]        DOB: 10-05-1965          Sex: male   REASON FOR CONSULT: Anterior spine exposure for L5-S1 ALIF   HPI: Gregory Thompson is a 57 y.o. male, with history of hypertension, hypertriglyceridemia, liver cirrhosis that presents for evaluation of anterior spine exposure for L5-S1 ALIF.  Patient describes years of chronic lower back pain.  This has gotten worse over the last year with left leg radiculopathy.  He has failed conservative management.  Dr. Jordan Likes was neurosurgery has evaluated him and recommended anterior spine exposure with ALIF at L5-S1.  Past abdominal surgical history includes laparoscopic cholecystectomy.  He states he is not being treated for cirrhosis but was told he had this in the past.       Past Medical History:  Diagnosis Date   Complication of anesthesia      nausea   Fatty liver     Gout     Hx of gallstones     Hypertension     Hypertriglyceridemia     Kidney stone     Liver cirrhosis (HCC) 1997    per patient: imaging does not indicate this as of 2019   Prediabetes             Past Surgical History:  Procedure Laterality Date   BILIARY STENT PLACEMENT   02/11/2018    Procedure: BILIARY STENT PLACEMENT;  Surgeon: Sherrilyn Rist, MD;  Location: [facility] ENDOSCOPY;  Service: Gastroenterology;;   CHOLECYSTECTOMY N/A 02/09/2018    Procedure: LAPAROSCOPIC CHOLECYSTECTOMY WITH [name];  Surgeon: Kinsinger, De Blanch, MD;  Location: [facility] OR;  Service: General;  Laterality: N/A;   COLONOSCOPY         remote past by Dr. Karilyn Cota 20 years ago   ENDOSCOPIC RETROGRADE CHOLANGIOPANCREATOGRAPHY (ERCP) WITH PROPOFOL N/A 02/08/2018    Procedure: ENDOSCOPIC RETROGRADE CHOLANGIOPANCREATOGRAPHY (ERCP) WITH PROPOFOL;  Surgeon: Sherrilyn Rist, MD;  Location: [facility] ENDOSCOPY;  Service: Gastroenterology;  Laterality: N/A;   ENDOSCOPIC RETROGRADE CHOLANGIOPANCREATOGRAPHY (ERCP) WITH PROPOFOL N/A 02/11/2018    Procedure: ENDOSCOPIC RETROGRADE CHOLANGIOPANCREATOGRAPHY (ERCP) WITH PROPOFOL;  Surgeon: Sherrilyn Rist, MD;  Location: [facility] ENDOSCOPY;  Service: Gastroenterology;  Laterality: N/A;   ESOPHAGOGASTRODUODENOSCOPY        EGD 20 years ago   LIVER BIOPSY       REMOVAL OF STONES   02/11/2018    Procedure: REMOVAL OF STONES;  Surgeon: Sherrilyn Rist, MD;  Location: [facility] ENDOSCOPY;  Service: Gastroenterology;;   [name]   02/08/2018    Procedure: SPHINCTEROTOMY;  Surgeon: Sherrilyn Rist, MD;  Location: [facility]COPY;  Service: Gastroenterology;;   [name]   02/11/2018    Procedure: SPHINCTEROTOMY;  Surgeon: [name],  Andreas Blower, MD;  Location: [facility] ENDOSCOPY;  Service: Gastroenterology;;   VASECTOMY               Family History  Problem Relation Age of Onset   Hyperlipidemia Father     Diabetes Paternal Grandfather     Colon cancer Neg Hx     Colon polyps Neg Hx     Liver disease Neg Hx        SOCIAL HISTORY: Social History         Socioeconomic History   Marital status: Married      Spouse name: Not on file   Number of children: Not on file   Years of education: Not on file   Highest education level: Not on file  Occupational History   Not on file  Tobacco Use   Smoking status: Former      Types: Cigarettes    Smokeless tobacco: Current      Types: Chew  Vaping Use   Vaping Use: Never used  Substance and Sexual Activity   Alcohol use: No   Drug use: No   Sexual activity: Yes  Other Topics Concern   Not on file  Social History Narrative   Not on file    Social Determinants of Health    Financial Resource Strain: Not on file  Food Insecurity: Not on file  Transportation Needs: Not on file  Physical Activity: Not on file  Stress: Not on file  Social Connections: Not on file  Intimate Partner Violence: Not on file           Allergies  Allergen Reactions   Eluxadoline Other (See Comments)      Low blood sugar     Azithromycin        Abdominal pain   Cefzil [Cefprozil]        Abdominal pain   Lisinopril Cough            Current Outpatient Medications  Medication Sig Dispense Refill   acetaminophen (TYLENOL) 500 MG tablet Take 1,000 mg by mouth every 6 (six) hours as needed for moderate pain.       allopurinol (ZYLOPRIM) 300 MG tablet TAKE 1 TABLET(300 MG) BY MOUTH DAILY 90 tablet 1   APPLE CIDER VINEGAR PO Take 1 capsule by mouth 2 (two) times daily.       Ascorbic Acid (VITAMIN C) 1000 MG tablet Take 1,000 mg by mouth 2 (two) times daily.       Blood Glucose Monitoring Suppl MISC Glucose testing supplies-test once a day 50 each 11   CINNAMON PO Take 1,000 mg by mouth 2 (two) times daily.       empagliflozin (JARDIANCE) 25 MG TABS tablet Take one tablet po daily 90 tablet 1   gemfibrozil (LOPID) 600 MG tablet TAKE 1 TABLET BY MOUTH EVERY OTHER DAY (Patient taking differently: Take 600 mg by mouth daily.) 45 tablet 0   glipiZIDE (GLUCOTROL XL) 2.5 MG 24 hr tablet Take 2.5 mg by mouth daily.       hyoscyamine (LEVBID) 0.375 MG 12 hr tablet Take 1 tablet (0.375 mg total) by mouth every 12 (twelve) hours as needed. (Patient taking differently: Take 0.375 mg by mouth 2 (two) times daily.) 180 tablet 1   losartan (COZAAR) 50 MG tablet TAKE 1 TABLET(50 MG) BY MOUTH DAILY 90 tablet 1    Melatonin 10 MG CAPS Take 10 mg by mouth at bedtime as needed (sleep).  metFORMIN (GLUCOPHAGE-XR) 500 MG 24 hr tablet Take 2 tablets by mouth daily (Patient taking differently: Take 500-1,000 mg by mouth See admin instructions. Take 1000 mg in the morning and 500 mg in the evening) 60 tablet 5   Omega-3 Fatty Acids (OMEGA-3 FISH OIL PO) Take 2,000 mg by mouth daily.       ONE TOUCH ULTRA TEST test strip TEST ONCE DAILY AS DIRECTED 50 each 4   pantoprazole (PROTONIX) 40 MG tablet TAKE 1 TABLET(40 MG) BY MOUTH TWICE DAILY (Patient taking differently: Take 40 mg by mouth daily.) 180 tablet 1   Phenylephrine-APAP-guaiFENesin (TYLENOL SINUS SEVERE PO) Take 2 tablets by mouth daily as needed (congestion).       pravastatin (PRAVACHOL) 40 MG tablet Take 40 mg by mouth daily.       Probiotic Product (PROBIOTIC PO) Take 1 capsule by mouth daily.       sertraline (ZOLOFT) 50 MG tablet 1 qd 30 tablet 5   SIMPLY SALINE NA Place 1 spray into the nose daily as needed (congestion).        No current facility-administered medications for this visit.      REVIEW OF SYSTEMS:  [X]  denotes positive finding, [ ]  denotes negative finding Cardiac   Comments:  Chest pain or chest pressure:      Shortness of breath upon exertion:      Short of breath when lying flat:      Irregular heart rhythm:             Vascular      Pain in calf, thigh, or hip brought on by ambulation:      Pain in feet at night that wakes you up from your sleep:       Blood clot in your veins:      Leg swelling:              Pulmonary      Oxygen at home:      Productive cough:       Wheezing:              Neurologic      Sudden weakness in arms or legs:       Sudden numbness in arms or legs:       Sudden onset of difficulty speaking or slurred speech:      Temporary loss of vision in one eye:       Problems with dizziness:              Gastrointestinal      Blood in stool:       Vomited blood:               Genitourinary      Burning when urinating:       Blood in urine:             Psychiatric      Major depression:              Hematologic      Bleeding problems:      Problems with blood clotting too easily:             Skin      Rashes or ulcers:             Constitutional      Fever or chills:          PHYSICAL EXAM: There were no vitals filed for this visit.  GENERAL: The patient is a well-nourished male, in no acute distress. The vital signs are documented above. CARDIAC: There is a regular rate and rhythm.  VASCULAR:  Bilateral femoral pulses palpable Bilateral DP PT pulses palpable PULMONARY: No respiratory distress. ABDOMEN: Soft and non-tender.  Laparocopic incisions from laparoscopic cholecystectomy. MUSCULOSKELETAL: There are no major deformities or cyanosis. NEUROLOGIC: No focal weakness or paresthesias are detected. SKIN: There are no ulcers or rashes noted. PSYCHIATRIC: The patient has a normal affect.   DATA:    MRI Lumbar Spine Reviewed 05/18/22      Assessment/Plan:   57 year old male with chronic lower back pain that presents for pre-op evaluation of anterior spine exposure for L5-S1 ALIF.  I reviewed his MRI and discussed he would be a good candidate for anterior approach.  I discussed transverse incision over the left rectus muscle mobilizing the left rectus muscle to enter into the retroperitoneum and mobilize peritoneum and left ureter across midline.  Discussed mobilizing iliac artery and vein to get to the disc space exposed from the front.  Discussed risk of injury to the above structures.  Risk of retrograde ejaculation as well as hernia.  All questions answered.  Look forward to assisting Dr. Jordan Likes on 02/05/23.     Gregory Shelling, MD [facility] Office: [phone]

## 2023-02-05 NOTE — Op Note (Signed)
Date: Feb 05, 2023  Preoperative diagnosis: Chronic lower back pain with degenerative disc disease  Postoperative diagnosis: Same  Procedure: Anterior spine exposure via anterior retroperitoneal approach for L5-S1 ALIF  Surgeon: Dr. Cephus Shelling, MD  Co-surgeon: Dr. Julio Sicks, MD  Indications: 57 year old male with chronic lower back pain with evidence of L5-S1 degenerative disc disease and foraminal stenosis.  Vascular surgery was asked to assist with anterior spine exposure for L5-S1 ALIF.  Patient presents after risks benefits discussed.  Findings: The L5-S1 disc space was marked over the left rectus muscle with a fluoroscopic C arm in the lateral position.  Transverse incision was made here and we opened the anterior rectus sheath and raised flaps and then mobilized the left rectus muscle to the midline.  Entered lateral to the muscle mobilized peritoneum and left ureter to the midline.  Middle sacral vessels were divided between clips.  Mobilized the left iliac vein lateral to the disc space.  Fixed NuVasive retractor was placed.  Confirmed we were at the correct level on lateral fluoroscopy with a spinal needle in the disc space.  Anesthesia: General  Details: Patient was taken to the operating room after informed consent was obtained.  Placed on the operative table in supine position.  General endotracheal anesthesia was induced.  Fluoroscopic C-arm was then used in the lateral position to mark the L5-S1 disc space.  The abdominal wall was then prepped and draped in standard sterile fashion.  Antibiotics were given and timeout performed.  Made a transverse incision here at our preoperative mark and dissected down through the subcutaneous tissue with Bovie cautery.  The anterior rectus sheath was opened transversely.  Raised flaps underneath the anterior rectus sheath with hemostats.  The left rectus muscle was mobilized to the midline and I entered lateral to the muscle mobilized  peritoneum and left ureter to the midline.  Middle sacral vessels were divided between clips.  I mobilized on both sides of the disc space with blunt dissection including mobilizing the left iliac vein lateral with Dr. Dutch Quint pulling the peritoneum and left ureter to the midline with hand-held Wiley retractors.  Once we had good working room here I placed a fixed NuVasive retractor on the field.  A 140 reverse lip was placed on each side of the disc space with 120 reverse lip cranial and a 140 reverse lip caudal.  We had good anterior exposure of the disc space.  A needle was placed in the disc space and we confirmed we were at the correct level on lateral fluoroscopy.  Case was turned over to Dr. Jordan Likes.  Please see his dictation for the remainder of the case.  Complication: None  Condition: Stable  Cephus Shelling, MD [facility] [place] Office: [phone]   Cephus Shelling

## 2023-02-05 NOTE — Anesthesia Preprocedure Evaluation (Addendum)
Anesthesia Evaluation  Patient identified by MRN, date of birth, ID band Patient awake    Reviewed: Allergy & Precautions, NPO status , Patient's Chart, lab work & pertinent test results  History of Anesthesia Complications (+) PONV and history of anesthetic complications  Airway Mallampati: I  TM Distance: >3 FB Neck ROM: Full    Dental  (+) Dental Advisory Given, Teeth Intact, Missing,    Pulmonary neg shortness of breath, neg sleep apnea, neg COPD, neg recent URI, former smoker   breath sounds clear to auscultation       Cardiovascular hypertension, (-) angina (-) Past MI and (-) CHF  Rhythm:Regular     Neuro/Psych  Headaches PSYCHIATRIC DISORDERS Anxiety Depression     Neuromuscular disease    GI/Hepatic ,GERD  ,,Lab Results      Component                Value               Date                      ALT                      37                  09/10/2020                AST                      27                  09/10/2020                ALKPHOS                  61                  09/10/2020                BILITOT                  0.4                 09/10/2020              Endo/Other  diabetes, Type 2  Lab Results      Component                Value               Date                      HGBA1C                   6.1 (H)             01/26/2023             Renal/GU Renal diseaseLab Results      Component                Value               Date                      CREATININE               1.23  01/26/2023            Lab Results      Component                Value               Date                      K                        4.0                 01/26/2023                Musculoskeletal negative musculoskeletal ROS (+)    Abdominal   Peds  Hematology negative hematology ROS (+) Lab Results      Component                Value               Date                      WBC                       6.4                 01/26/2023                HGB                      15.0                01/26/2023                HCT                      45.0                01/26/2023                MCV                      88.9                01/26/2023                PLT                      245                 01/26/2023              Anesthesia Other Findings   Reproductive/Obstetrics                             Anesthesia Physical Anesthesia Plan  ASA: 2  Anesthesia Plan: General   Post-op Pain Management: Ofirmev IV (intra-op)* and Ketamine IV*   Induction: Intravenous  PONV Risk Score and Plan: 4 or greater and Ondansetron, Dexamethasone, Propofol infusion and Midazolam  Airway Management Planned: Oral ETT  Additional Equipment: None  Intra-op Plan:   Post-operative Plan: Extubation in OR  Informed Consent: I have reviewed the patients History and Physical, chart, labs and discussed the procedure including the risks, benefits and alternatives for the proposed  anesthesia with the patient or authorized representative who has indicated his/her understanding and acceptance.     Dental advisory given  Plan Discussed with: CRNA  Anesthesia Plan Comments:        Anesthesia Quick Evaluation

## 2023-02-05 NOTE — Op Note (Signed)
Date of procedure: 02/05/2023  Date of dictation: Same  Service: Neurosurgery  Preoperative diagnosis: L5-S1 retrolisthesis with foraminal stenosis and chronic radiculopathy  Postoperative diagnosis: Same  Procedure Name: L5-S1 anterior lumbar interbody fusion utilizing interbody cage, morselized allograft, bone morphogenic protein, and anterior fixation.  Surgeon:Hayla Hinger A.Diem Dicocco, M.D.  Asst. Surgeon: Doran Durand, NP  Vascular Surgeon Chestine Spore, MD  Anesthesia: General  Indication: 57 year old male with chronic and progressively worsening back pain with radicular symptoms failing conservative manage.  Workup demonstrates evidence of severe disc generation with degenerative disc base collapse and degenerative retrolisthesis at L5-S1 with resultant moderate central stenosis and severe bilateral neural foraminal stenosis.  Patient has failed conservative management presents now for anterior lumbar body fusion in hopes of improving his symptoms.  Operative note: After induction of anesthesia, patient positioned supine with arms outstretched.  Patient's lower abdominal region prepped and draped sterilely.  Incision made by Dr. Chestine Spore of the vascular surgery service overlying the L5-S1 disc space on the left side.  Medially.  This is carried down sharply through the subcutaneous fat to the anterior rectus fascia.  Anterior rectus fascia was elevated and divided.  Rectus muscles were spared and dissected free.  Working laterally in the retroperitoneal space dissection was then made by Dr. Chestine Spore carefully protecting the ureter and identifying the iliac vessels.  Exposure of the sacral promontory was made.  The sacral vessels were dissected free and controlled with clips before dividing.  Self-retaining retractor system from NuVasive was placed.  The L5-S1 disc space was confirmed by both AP and lateral fluoroscopy.  Midline was established.  I then performed discectomy by incising the disc space at L5-S1.  Disc was  removed with various instruments down to level the posterior annulus.  Using a variety of curettes and Kerrison rongeurs remaining aspects of annulus and posterior osteophyte was resected.  Posterior logical ligament was identified but not fully divided.  Decompression then proceeded laterally and into each neural foramina.  At this point a very thorough decompression been achieved.  Endplates were prepared.  Disc base was then progressively distracted.  A 15 mm x 15 degree implant was sized and found to be most appropriate.  A globus 15 mm x 15 degree large implant was selected.  This was then packed with Trinity allograft and a extra small bone morphogenic protein soaked sponge.  This was then impacted into place.  Anterior fixation through the integral locking mechanism and stays were then secured by impacting the stays into the body of L5 and S1.  1 and L5 into and S1.  Final images revealed good position of the cage and the hardware at the proper operative level with normal alignment of spine.  The locking mechanism was engaged over the stays.  Wound was irrigated.  Gelfoam was placed in the posterior sacral region for a little bit of venous oozing but no frank hemorrhaging.  Wound is then closed in layers with running Prolene at the anterior rectus fascia and layers of Vicryl then closing the skin.  No apparent complications.  Patient tolerated the procedure well and he returns to the recovery room postop.

## 2023-02-05 NOTE — Progress Notes (Signed)
Postop check.  Doing well.  Minimal back pain.  Some incisional pain.  No radicular symptoms.  Belly soft.  Pulses intact.  Motor and sensory function intact in both lower extremities.  Doing well status post L5-S1 anterior lumbar fusion.  Mobilize tonight.  Hopefully home in the morning.

## 2023-02-05 NOTE — Anesthesia Procedure Notes (Signed)
Procedure Name: Intubation Date/Time: 02/05/2023 8:14 AM  Performed by: Orlin Hilding, CRNAPre-anesthesia Checklist: Patient identified, Emergency Drugs available, Suction available, Patient being monitored and Timeout performed Patient Re-evaluated:Patient Re-evaluated prior to induction Oxygen Delivery Method: Circle system utilized Preoxygenation: Pre-oxygenation with 100% oxygen Induction Type: IV induction Ventilation: Mask ventilation without difficulty and Oral airway inserted - appropriate to patient size Laryngoscope Size: Mac and 4 Grade View: Grade I Tube type: Oral Tube size: 7.5 mm Number of attempts: 1 Placement Confirmation: ETT inserted through vocal cords under direct vision, positive ETCO2 and breath sounds checked- equal and bilateral Secured at: 23 cm Tube secured with: Tape Dental Injury: Teeth and Oropharynx as per pre-operative assessment

## 2023-02-05 NOTE — Brief Op Note (Signed)
02/05/2023  9:26 AM  PATIENT:  Gregory Thompson  57 y.o. male  PRE-OPERATIVE DIAGNOSIS:  Spondylolisthesis  POST-OPERATIVE DIAGNOSIS:  Spondylolisthesis  PROCEDURE:  Procedure(s): Anterior Lumbar Interbody Fusion - Lumbar Five-Sacral One (N/A) ABDOMINAL EXPOSURE (N/A)  SURGEON:  Surgeon(s) and Role: Panel 1:    Julio Sicks, MD - Primary Panel 2:    Cephus Shelling, MD - Primary  PHYSICIAN ASSISTANT:   ASSISTANTS: BVergman,NP   ANESTHESIA:   general  EBL:  30cc   BLOOD ADMINISTERED:none  DRAINS: none   LOCAL MEDICATIONS USED:  MARCAINE     SPECIMEN:  No Specimen  DISPOSITION OF SPECIMEN:  N/A  COUNTS:  YES  TOURNIQUET:  * No tourniquets in log *  DICTATION: .Dragon Dictation  PLAN OF CARE: Admit to inpatient   PATIENT DISPOSITION:  PACU - hemodynamically stable.   Delay start of Pharmacological VTE agent (>24hrs) due to surgical blood loss or risk of bleeding: yes

## 2023-02-05 NOTE — Transfer of Care (Signed)
Immediate Anesthesia Transfer of Care Note  Patient: Gregory Thompson  Procedure(s) Performed: Anterior Lumbar Interbody Fusion - Lumbar Five-Sacral One ABDOMINAL EXPOSURE  Patient Location: PACU  Anesthesia Type:General  Level of Consciousness: drowsy  Airway & Oxygen Therapy: Patient Spontanous Breathing and Patient connected to face mask oxygen  Post-op Assessment: Report given to RN and Post -op Vital signs reviewed and stable  Post vital signs: Reviewed and stable  Last Vitals:  Vitals Value Taken Time  BP 124/87 02/05/23 1007  Temp    Pulse 107 02/05/23 1010  Resp 15 02/05/23 1010  SpO2 95 % 02/05/23 1010  Vitals shown include unvalidated device data.  Last Pain:  Vitals:   02/05/23 0635  TempSrc:   PainSc: 0-No pain         Complications: No notable events documented.

## 2023-02-06 LAB — GLUCOSE, CAPILLARY: Glucose-Capillary: 108 mg/dL — ABNORMAL HIGH (ref 70–99)

## 2023-02-06 MED ORDER — HYDROCODONE-ACETAMINOPHEN 5-325 MG PO TABS: 1.0000 | ORAL_TABLET | Freq: Four times a day (QID) | ORAL | 0 refills | Status: AC | PRN

## 2023-02-06 MED ORDER — CYCLOBENZAPRINE HCL 10 MG PO TABS: 10.0000 mg | ORAL_TABLET | Freq: Three times a day (TID) | ORAL | 0 refills | Status: DC | PRN

## 2023-02-06 MED ORDER — HYDROCODONE-ACETAMINOPHEN 5-325 MG PO TABS
1.0000 | ORAL_TABLET | ORAL | Status: DC | PRN
  Administered 2023-02-06: 1 via ORAL
  Filled 2023-02-06 (×2): qty 1

## 2023-02-06 NOTE — Evaluation (Signed)
Occupational Therapy Evaluation Patient Details Name: Gregory Thompson MRN: [id number] DOB: Jan 20, 1966 Today's Date: 02/06/2023   History of Present Illness 57 yo male s/p 02/05/23 ALIF L5-s1 interbody cage  PMH DM2, fatty liver, gout, gallstones, hTN, kidney stones,   Clinical Impression   Patient evaluated by Occupational Therapy with no further acute OT needs identified. All education has been completed and the patient has no further questions. See below for any follow-up Occupational Therapy or equipment needs. OT to sign off. Thank you for referral.        Recommendations for follow up therapy are one component of a multi-disciplinary discharge planning process, led by the attending physician.  Recommendations may be updated based on patient status, additional functional criteria and insurance authorization.   Assistance Recommended at Discharge None  Patient can return home with the following Assist for transportation    Functional Status Assessment  Patient has had a recent decline in their functional status and demonstrates the ability to make significant improvements in function in a reasonable and predictable amount of time.  Equipment Recommendations  Other (comment) (RW)    Recommendations for Other Services       Precautions / Restrictions Precautions Precautions: Back Precaution Booklet Issued: Yes (comment) Precaution Comments: back handout present and reviewed for adls. Required Braces or Orthoses: Spinal Brace Spinal Brace: Lumbar corset Restrictions Weight Bearing Restrictions: No      Mobility Bed Mobility               General bed mobility comments: oob on arrival    Transfers Overall transfer level: Needs assistance Equipment used: Rolling walker (2 wheels) Transfers: Sit to/from Stand Sit to Stand: Modified independent (Device/Increase time)           General transfer comment: cues for hand placement      Balance Overall balance  assessment: Mild deficits observed, not formally tested                                         ADL either performed or assessed with clinical judgement   ADL Overall ADL's : Modified independent                                       General ADL Comments: dressing for home during session don doff brace indep during session  Back handout provided and reviewed adls in detail. Pt educated on: clothing between brace, never sleep in brace, set an alarm at night for medication, avoid sitting for long periods of time, correct bed positioning for sleeping, correct sequence for bed mobility, avoiding lifting more than 5 pounds and never wash directly over incision. All education is complete and patient indicates understanding.    Vision Baseline Vision/History: 1 Wears glasses Ability to See in Adequate Light: 0 Adequate Patient Visual Report: No change from baseline       Perception     Praxis      Pertinent Vitals/Pain Pain Assessment Pain Assessment: 0-10 Pain Score: 4  Pain Location: lower abdomen Pain Descriptors / Indicators: Sore Pain Intervention(s): Premedicated before session, Monitored during session, Repositioned     Hand Dominance Right   Extremity/Trunk Assessment Upper Extremity Assessment Upper Extremity Assessment: Overall WFL for tasks assessed   Lower Extremity Assessment Lower Extremity Assessment: Defer to PT  evaluation   Cervical / Trunk Assessment Cervical / Trunk Assessment: Back Surgery   Communication Communication Communication: No difficulties   Cognition Arousal/Alertness: Awake/alert Behavior During Therapy: WFL for tasks assessed/performed Overall Cognitive Status: Within Functional Limits for tasks assessed                                       General Comments  incision dry and intact on arrival    Exercises     Shoulder Instructions      Home Living Family/patient expects to be  discharged to:: Private residence Living Arrangements: Spouse/significant other Available Help at Discharge: Family Type of Home: Mobile home Home Access: Stairs to enter Entrance Stairs-Number of Steps: 3 Entrance Stairs-Rails: Left Home Layout: One level     Bathroom Shower/Tub: Curtain   Firefighter: Standard     Home Equipment: Cane - single point   Additional Comments: unemployeed. 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, has 6 dogs and wife can care for them      Prior Functioning/Environment Prior Level of Function : Independent/Modified Independent (Activity limited by back and leg pain)                        OT Problem List:        OT Treatment/Interventions:      OT Goals(Current goals can be found in the care plan section) Acute Rehab OT Goals Patient Stated Goal: to go home today OT Goal Formulation: With patient/family  OT Frequency:      Co-evaluation              AM-PAC OT "6 Clicks" Daily Activity     Outcome Measure Help from another person eating meals?: None Help from another person taking care of personal grooming?: None Help from another person toileting, which includes using toliet, bedpan, or urinal?: None Help from another person bathing (including washing, rinsing, drying)?: None Help from another person to put on and taking off regular upper body clothing?: None Help from another person to put on and taking off regular lower body clothing?: None 6 Click Score: 24   End of Session Equipment Utilized During Treatment: Rolling walker (2 wheels);Back brace Nurse Communication: Mobility status;Precautions  Activity Tolerance: Patient tolerated treatment well Patient left: Other (comment) (standing at EOB with MD arriving to round)  OT Visit Diagnosis: Unsteadiness on feet (R26.81)                Time: 1610-9604 OT Time Calculation (min): 23 min Charges:  OT General Charges $OT Visit: 1 Visit OT Evaluation $OT Eval  Moderate Complexity: 1 Mod   Brynn, OTR/L  Acute Rehabilitation Services Office: [phone] .   Mateo Flow 02/06/2023, 10:03 AM

## 2023-02-06 NOTE — Progress Notes (Signed)
[facility]  Subjective  -no complaints.  No n/v.   Objective 115/76 100 98.6 F (37 C) (Oral) 20 100%  Intake/Output Summary (Last 24 hours) at 02/06/2023 0914 Last data filed at 02/06/2023 0600 Gross per 24 hour  Intake 1905.38 ml  Output 550 ml  Net 1355.38 ml    Appropriate postop incisional tenderness of the abdomen Left DP palpable  Laboratory Lab Results: No results for input(s): "WBC", "HGB", "HCT", "PLT" in the last 72 hours. BMET No results for input(s): "NA", "K", "CL", "CO2", "GLUCOSE", "BUN", "CREATININE", "CALCIUM" in the last 72 hours.  COAG Lab Results  Component Value Date   INR 1.05 02/06/2018   No results found for: "PTT"  Assessment/Planning:  Postop day 1 status post anterior spine exposure for L5-S1 ALIF.  Looks good this morning.  Appropriate postop abdominal incisional tenderness.  Incision looks good.  Palpable DP pulse in the left foot.  Okay for discharge from our standpoint.  Cephus Shelling 02/06/2023 9:14 AM --

## 2023-02-06 NOTE — Discharge Instructions (Signed)
Wound Care Keep incision covered and dry for three days.   Do not put any creams, lotions, or ointments on incision. Leave steri-strips on back.  They will fall off by themselves. Activity Walk each and every day, increasing distance each day. No lifting greater than 8 lbs.  Avoid excessive back bending. No driving for 2 weeks; may ride as a passenger locally. If provided with back brace, wear when out of bed.  It is not necessary to wear brace in bed. Diet Resume your normal diet.   Call Your Doctor If Any of These Occur Redness, drainage, or swelling at the wound.  Temperature greater than 101 degrees. Severe pain not relieved by pain medication. Incision starts to come apart. Follow Up Appt Call  [phone])  for problems.  If you have any hardware placed in your spine, you will need an x-ray before your appointment.

## 2023-02-06 NOTE — Progress Notes (Signed)
  NEUROSURGERY PROGRESS NOTE   No issues overnight. Mild incisional pain, no back or leg pain  EXAM:  BP 115/76 (BP Location: Left Arm)   Pulse 100   Temp 98.6 F (37 C) (Oral)   Resp 20   Ht 5\' 8"  (1.727 m)   Wt 79.8 kg   SpO2 100%   BMI 26.76 kg/m   Awake, alert, oriented  Speech fluent, appropriate  CN grossly intact  5/5 BUE/BLE  Wound c/d/I  IMPRESSION:  57 y.o. male POD#1 L5-S1 ALIF, doing well  PLAN: - d/c home today   Lisbeth Renshaw, MD [facility]

## 2023-02-06 NOTE — Anesthesia Postprocedure Evaluation (Signed)
Anesthesia Post Note  Patient: Gregory Thompson  Procedure(s) Performed: Anterior Lumbar Interbody Fusion - Lumbar Five-Sacral One ABDOMINAL EXPOSURE     Patient location during evaluation: PACU Anesthesia Type: General Level of consciousness: awake and alert Pain management: pain level controlled Vital Signs Assessment: post-procedure vital signs reviewed and stable Respiratory status: spontaneous breathing, nonlabored ventilation, respiratory function stable and patient connected to nasal cannula oxygen Cardiovascular status: stable Postop Assessment: no apparent nausea or vomiting Anesthetic complications: no   No notable events documented.  Last Vitals:  Vitals:   02/06/23 0452 02/06/23 0715  BP: 117/74 115/76  Pulse: 88 100  Resp: 16 20  Temp: 36.9 C 37 C  SpO2: 98% 100%    Last Pain:  Vitals:   02/06/23 0715  TempSrc: Oral  PainSc:                  Lakeena Downie

## 2023-02-06 NOTE — Progress Notes (Signed)
Patient alert and oriented, mae's well, voiding adequate amount of urine, swallowing without difficulty, no c/o pain at time of discharge. Patient discharged home with family. Script and discharged instructions given to patient. Patient and spouse stated understanding of instructions given. Patient has an appointment with Dr. Jordan Likes.

## 2023-02-06 NOTE — Evaluation (Signed)
Physical Therapy Evaluation Patient Details Name: Gregory Thompson MRN: [id number] DOB: 1966/02/05 Today's Date: 02/06/2023  History of Present Illness  57 yo male s/p 02/05/23 ALIF L5-s1 interbody cage  PMH DM2, fatty liver, gout, gallstones, hTN, kidney stones,  Clinical Impression  Pt s/p above surgery.  Mobilizing well. Pt feels ready for DC home today.   I have encouraged the patient to gradually increase activity daily to tolerance.   I have answered all patient's questions regarding PT and mobility.             Recommendations for follow up therapy are one component of a multi-disciplinary discharge planning process, led by the attending physician.  Recommendations may be updated based on patient status, additional functional criteria and insurance authorization.  Follow Up Recommendations       Assistance Recommended at Discharge PRN  Patient can return home with the following       Equipment Recommendations Rolling walker (2 wheels)  Recommendations for Other Services       Functional Status Assessment Patient has had a recent decline in their functional status and demonstrates the ability to make significant improvements in function in a reasonable and predictable amount of time.     Precautions / Restrictions Precautions Precautions: Back Precaution Booklet Issued: Yes (comment) Precaution Comments: educated in no B,L,T Required Braces or Orthoses: Spinal Brace Spinal Brace: Lumbar corset Restrictions Weight Bearing Restrictions: No      Mobility  Bed Mobility Overal bed mobility:  (NT, sitting EOB when PT arrived)             General bed mobility comments: pt verbalized log rolling    Transfers Overall transfer level: Needs assistance Equipment used: Rolling walker (2 wheels) Transfers: Sit to/from Stand Sit to Stand: Supervision           General transfer comment: cues for safest technique    Ambulation/Gait Ambulation/Gait assistance:  Supervision Gait Distance (Feet): 150 Feet Assistive device: Rolling walker (2 wheels) Gait Pattern/deviations: Step-through pattern       General Gait Details: no loss of balance  Stairs Stairs: Yes Stairs assistance: Supervision Stair Management: One rail Left, Step to pattern, Forwards Number of Stairs: 3 General stair comments: instructional cues to step up with stronger leg first and down with weaker leg first  Wheelchair Mobility    Modified Rankin (Stroke Patients Only)       Balance                                             Pertinent Vitals/Pain Pain Assessment Pain Assessment: 0-10 Pain Score: 4  Pain Location: lower abdomen Pain Descriptors / Indicators: Sore Pain Intervention(s): Limited activity within patient's tolerance, Monitored during session    Home Living Family/patient expects to be discharged to:: Private residence Living Arrangements: Spouse/significant other Available Help at Discharge: Family Type of Home: Mobile home Home Access: Stairs to enter Entrance Stairs-Rails: Left Entrance Stairs-Number of Steps: 3   Home Layout: One level Home Equipment: Cane - single point      Prior Function Prior Level of Function : Independent/Modified Independent (Activity limited by back and leg pain)                     Hand Dominance        Extremity/Trunk Assessment   Upper Extremity Assessment Upper Extremity  Assessment: Overall WFL for tasks assessed    Lower Extremity Assessment Lower Extremity Assessment: Generalized weakness    Cervical / Trunk Assessment Cervical / Trunk Assessment: Back Surgery  Communication   Communication: No difficulties  Cognition Arousal/Alertness: Awake/alert Behavior During Therapy: WFL for tasks assessed/performed Overall Cognitive Status: Within Functional Limits for tasks assessed                                          General Comments General  comments (skin integrity, edema, etc.): wife present at end of session    Exercises     Assessment/Plan    PT Assessment Patient does not need any further PT services  PT Problem List         PT Treatment Interventions      PT Goals (Current goals can be found in the Care Plan section)  Acute Rehab PT Goals Patient Stated Goal: to go home today    Frequency       Co-evaluation               AM-PAC PT "6 Clicks" Mobility  Outcome Measure Help needed turning from your back to your side while in a flat bed without using bedrails?: None Help needed moving from lying on your back to sitting on the side of a flat bed without using bedrails?: None Help needed moving to and from a bed to a chair (including a wheelchair)?: None Help needed standing up from a chair using your arms (e.g., wheelchair or bedside chair)?: None Help needed to walk in hospital room?: A Little Help needed climbing 3-5 steps with a railing? : A Little 6 Click Score: 22    End of Session Equipment Utilized During Treatment: Gait belt;Back brace Activity Tolerance: Patient tolerated treatment well Patient left: in bed;with family/visitor present (sitting EOB) Nurse Communication: Other (comment) (Need for RW) PT Visit Diagnosis: Unsteadiness on feet (R26.81)    Time: 4098-1191 PT Time Calculation (min) (ACUTE ONLY): 13 min   Charges:   PT Evaluation $PT Eval Low Complexity: 1 Low          Gregory Thompson, PT   Acute Rehabilitation Services  Office [phone] 02/06/2023   Gregory Thompson 02/06/2023, 9:51 AM

## 2023-02-06 NOTE — Care Management (Signed)
Patient with order to DC to home today. Unit staff to provide DME needed for home.   No HH needs identified Patient will have family/ friends provide transportation home. No other TOC needs identified for DC 

## 2023-02-06 NOTE — Discharge Summary (Signed)
Physician Discharge Summary  Patient ID: Gregory Thompson MRN: [id number] DOB/AGE: [birth year] 57 y.o.  Admit date: 02/05/2023 Discharge date: 02/06/2023  Admission Diagnoses:  Spondylolisthesis L5-S1  Discharge Diagnoses:  Same Principal Problem:   Degenerative spondylolisthesis   Discharged Condition: Stable  Hospital Course:  Gregory Thompson is a 57 y.o. male admitted after elective L5-S1 ALIF. He was at baseline postop, ambulating well, tolerating diet, voiding normally with pain controlled with oral medication.  Treatments: Surgery - L5-S1 ALIF  Discharge Exam: Blood pressure 115/76, pulse 100, temperature 98.6 F (37 C), temperature source Oral, resp. rate 20, height 5\' 8"  (1.727 m), weight 79.8 kg, SpO2 100 %. Awake, alert, oriented Speech fluent, appropriate CN grossly intact 5/5 BUE/BLE Wound c/d/i  Disposition: Discharge disposition: 01-Home or Self Care       Discharge Instructions     Call MD for:  redness, tenderness, or signs of infection (pain, swelling, redness, odor or green/yellow discharge around incision site)   Complete by: As directed    Call MD for:  temperature >100.4   Complete by: As directed    Diet - low sodium heart healthy   Complete by: As directed    Discharge instructions   Complete by: As directed    Walk at home as much as possible, at least 4 times / day   Increase activity slowly   Complete by: As directed    Lifting restrictions   Complete by: As directed    No lifting > 10 lbs   May shower / Bathe   Complete by: As directed    48 hours after surgery   May walk up steps   Complete by: As directed    Other Restrictions   Complete by: As directed    No bending/twisting at waist   Remove dressing in 48 hours   Complete by: As directed       Allergies as of 02/06/2023       Reactions   Eluxadoline Other (See Comments)   Low blood sugar   Azithromycin    Abdominal pain   Cefzil [cefprozil]    Abdominal pain    Lisinopril Cough   Penicillins         Medication List     TAKE these medications    acetaminophen 500 MG tablet Commonly known as: TYLENOL Take 1,000 mg by mouth every 6 (six) hours as needed for moderate pain.   allopurinol 300 MG tablet Commonly known as: ZYLOPRIM TAKE 1 TABLET(300 MG) BY MOUTH DAILY   APPLE CIDER VINEGAR PO Take 1 capsule by mouth 2 (two) times daily.   Blood Glucose Monitoring Suppl Misc Glucose testing supplies-test once a day   CINNAMON PO Take 1,000 mg by mouth 2 (two) times daily.   cyclobenzaprine 10 MG tablet Commonly known as: FLEXERIL Take 1 tablet (10 mg total) by mouth 3 (three) times daily as needed for muscle spasms.   empagliflozin 25 MG Tabs tablet Commonly known as: Jardiance Take one tablet po daily   gemfibrozil 600 MG tablet Commonly known as: LOPID TAKE 1 TABLET BY MOUTH EVERY OTHER DAY What changed: when to take this   glipiZIDE 2.5 MG 24 hr tablet Commonly known as: GLUCOTROL XL Take 2.5 mg by mouth daily.   HYDROcodone-acetaminophen 5-325 MG tablet Commonly known as: NORCO/VICODIN Take 1-2 tablets by mouth every 6 (six) hours as needed for up to 7 days for moderate pain. What changed:  how much to take when to take  this reasons to take this   hyoscyamine 0.375 MG 12 hr tablet Commonly known as: LEVBID Take 1 tablet (0.375 mg total) by mouth every 12 (twelve) hours as needed. What changed: when to take this   losartan 50 MG tablet Commonly known as: COZAAR TAKE 1 TABLET(50 MG) BY MOUTH DAILY   Melatonin 10 MG Caps Take 10 mg by mouth at bedtime as needed (sleep).   metFORMIN 500 MG 24 hr tablet Commonly known as: GLUCOPHAGE-XR Take 2 tablets by mouth daily What changed:  how much to take how to take this when to take this additional instructions   OMEGA-3 FISH OIL PO Take 2,000 mg by mouth daily.   ONE TOUCH ULTRA TEST test strip Generic drug: glucose blood TEST ONCE DAILY AS DIRECTED    pantoprazole 40 MG tablet Commonly known as: PROTONIX TAKE 1 TABLET(40 MG) BY MOUTH TWICE DAILY What changed:  how much to take how to take this when to take this additional instructions   pravastatin 40 MG tablet Commonly known as: PRAVACHOL Take 40 mg by mouth daily.   PROBIOTIC PO Take 1 capsule by mouth daily.   sertraline 50 MG tablet Commonly known as: ZOLOFT 1 qd   SIMPLY SALINE NA Place 1 spray into the nose daily as needed (congestion).   Trulicity 1.5 MG/0.5ML Sopn Generic drug: Dulaglutide   TYLENOL SINUS SEVERE PO Take 2 tablets by mouth daily as needed (congestion).   vitamin C 1000 MG tablet Take 1,000 mg by mouth 2 (two) times daily.               Durable Medical Equipment  (From admission, onward)           Start     Ordered   02/05/23 1458  DME Walker rolling  Once       Question:  Patient needs a walker to treat with the following condition  Answer:  Degenerative spondylolisthesis   02/05/23 1457   02/05/23 1458  DME 3 n 1  Once        02/05/23 1457            Follow-up Information     Julio Sicks, MD Follow up.   Specialty: Neurosurgery Contact information: [address] [place] [address] [phone]                 Signed: Jackelyn Hoehn 02/06/2023, 9:43 AM

## 2023-02-06 NOTE — Plan of Care (Signed)

## 2023-02-08 ENCOUNTER — Encounter (HOSPITAL_COMMUNITY): Payer: Self-pay | Admitting: Neurosurgery

## 2023-02-08 ENCOUNTER — Telehealth: Payer: Self-pay

## 2023-02-08 NOTE — Transitions of Care (Post Inpatient/ED Visit) (Signed)
02/08/2023  Name: Gregory Thompson MRN: [id number] DOB: [birth year]  Today's TOC FU Call Status: Today's TOC FU Call Status:: Successful TOC FU Call Competed TOC FU Call Complete Date: 02/08/23  Transition Care Management Follow-up Telephone Call Date of Discharge: 02/06/23 Discharge Facility: [facility]) Type of Discharge: Inpatient Admission Primary Inpatient Discharge Diagnosis:: Degenerative spondylolisthesis How have you been since you were released from the hospital?: Better Any questions or concerns?: No  Items Reviewed: Did you receive and understand the discharge instructions provided?: Yes Medications obtained,verified, and reconciled?: Yes (Medications Reviewed) Any new allergies since your discharge?: No Dietary orders reviewed?: NA Do you have support at home?: Yes  Medications Reviewed Today: Medications Reviewed Today     Reviewed by Merleen Nicely, LPN (Licensed Practical Nurse) on 02/08/23 at 1452  Med List Status: <None>   Medication Order Taking? Sig Documenting Provider Last Dose Status Informant  acetaminophen (TYLENOL) 500 MG tablet [id number] Yes Take 1,000 mg by mouth every 6 (six) hours as needed for moderate pain. [provider] Taking Active Self  allopurinol (ZYLOPRIM) 300 MG tablet [id number] Yes TAKE 1 TABLET(300 MG) BY MOUTH DAILY Luking, Jonna Coup, MD Taking Active Self  APPLE CIDER VINEGAR PO [id number] Yes Take 1 capsule by mouth 2 (two) times daily. [provider] Taking Active Self  Ascorbic Acid (VITAMIN C) 1000 MG tablet [id number] Yes Take 1,000 mg by mouth 2 (two) times daily. [provider] Taking Active Self  Blood Glucose Monitoring Suppl MISC [id number] Yes Glucose testing supplies-test once a day Babs Sciara, MD Taking Active Self  CINNAMON PO [id number] Yes Take 1,000 mg by mouth 2 (two) times daily. [provider] Taking Active Self  cyclobenzaprine (FLEXERIL) 10 MG tablet [id number] Yes Take 1  tablet (10 mg total) by mouth 3 (three) times daily as needed for muscle spasms. Lisbeth Renshaw, MD Taking Active   Dulaglutide (TRULICITY) 1.5 MG/0.5ML Namon Cirri [id number] Yes  [provider] Taking Active   empagliflozin (JARDIANCE) 25 MG TABS tablet [id number] Yes Take one tablet po daily Luking, Scott A, MD Taking Active Self  gemfibrozil (LOPID) 600 MG tablet [id number] Yes TAKE 1 TABLET BY MOUTH EVERY OTHER DAY  Patient taking differently: Take 600 mg by mouth daily.   Babs Sciara, MD Taking Active Self  glipiZIDE (GLUCOTROL XL) 2.5 MG 24 hr tablet [id number] Yes Take 2.5 mg by mouth daily. [provider] Taking Active Self  HYDROcodone-acetaminophen (NORCO/VICODIN) 5-325 MG tablet [id number] Yes Take 1-2 tablets by mouth every 6 (six) hours as needed for up to 7 days for moderate pain. Lisbeth Renshaw, MD Taking Active   hyoscyamine (LEVBID) 0.375 MG 12 hr tablet [id number] Yes Take 1 tablet (0.375 mg total) by mouth every 12 (twelve) hours as needed.  Patient taking differently: Take 0.375 mg by mouth 2 (two) times daily.   Babs Sciara, MD Taking Active Self  losartan (COZAAR) 50 MG tablet [id number] Yes TAKE 1 TABLET(50 MG) BY MOUTH DAILY Luking, Jonna Coup, MD Taking Active Self  Melatonin 10 MG CAPS [id number] Yes Take 10 mg by mouth at bedtime as needed (sleep). [provider] Taking Active Self  metFORMIN (GLUCOPHAGE-XR) 500 MG 24 hr tablet [id number] Yes Take 2 tablets by mouth daily  Patient taking differently: Take 500-1,000 mg by mouth See admin instructions. Take 1000 mg in the morning and 500 mg in the evening   Luking, Jonna Coup, MD Taking Active Self  Omega-3 Fatty Acids (OMEGA-3 FISH OIL  PO) [id number] Yes Take 2,000 mg by mouth daily. [provider] Taking Active Self  ONE TOUCH ULTRA TEST test strip [id number] Yes TEST ONCE DAILY AS DIRECTED Luking, Jonna Coup, MD Taking Active Self  pantoprazole (PROTONIX) 40 MG tablet [id number] Yes TAKE 1  TABLET(40 MG) BY MOUTH TWICE DAILY  Patient taking differently: Take 40 mg by mouth daily.   Babs Sciara, MD Taking Active Self  Phenylephrine-APAP-guaiFENesin (TYLENOL SINUS SEVERE PO) [id number] Yes Take 2 tablets by mouth daily as needed (congestion). [provider] Taking Active Self  pravastatin (PRAVACHOL) 40 MG tablet [id number] Yes Take 40 mg by mouth daily. [provider] Taking Active Self  Probiotic Product (PROBIOTIC PO) [id number] Yes Take 1 capsule by mouth daily. [provider] Taking Active Self  sertraline (ZOLOFT) 50 MG tablet [id number] Yes 1 qd Babs Sciara, MD Taking Active Self  SIMPLY SALINE NA [id number] Yes Place 1 spray into the nose daily as needed (congestion). [provider] Taking Active Self            Home Care and Equipment/Supplies: Were Home Health Services Ordered?: No Any new equipment or medical supplies ordered?: No  Functional Questionnaire: Do you need assistance with bathing/showering or dressing?: No Do you need assistance with meal preparation?: No Do you need assistance with eating?: No Do you have difficulty maintaining continence: No Do you need assistance with getting out of bed/getting out of a chair/moving?: No Do you have difficulty managing or taking your medications?: No  Follow up appointments reviewed: PCP Follow-up appointment confirmed?: NA (following up with Dr Jordan Likes) Specialist [facility] Follow-up appointment confirmed?: Yes Date of Specialist follow-up appointment?: 02/17/23 Follow-Up Specialty Provider:: Dr Jordan Likes Do you need transportation to your follow-up appointment?: No Do you understand care options if your condition(s) worsen?: Yes-patient verbalized understanding    SIGNATURE  Woodfin Ganja LPN [facility] Nurse Health Advisor Direct Dial [phone]

## 2023-02-12 MED FILL — Heparin Sodium (Porcine) Inj 1000 Unit/ML: INTRAMUSCULAR | Qty: 30 | Status: AC

## 2023-02-18 ENCOUNTER — Other Ambulatory Visit: Payer: Self-pay | Admitting: Family Medicine

## 2023-02-18 ENCOUNTER — Telehealth: Payer: Self-pay | Admitting: Family Medicine

## 2023-02-18 ENCOUNTER — Other Ambulatory Visit: Payer: Self-pay

## 2023-02-18 DIAGNOSIS — F419 Anxiety disorder, unspecified: Secondary | ICD-10-CM

## 2023-02-18 DIAGNOSIS — K219 Gastro-esophageal reflux disease without esophagitis: Secondary | ICD-10-CM

## 2023-02-18 MED ORDER — SERTRALINE HCL 50 MG PO TABS
ORAL_TABLET | ORAL | 5 refills | Status: DC
Start: 2023-02-18 — End: 2023-08-18

## 2023-02-18 MED ORDER — SERTRALINE HCL 50 MG PO TABS
ORAL_TABLET | ORAL | 5 refills | Status: DC
Start: 2023-02-18 — End: 2023-02-18

## 2023-02-18 MED ORDER — PANTOPRAZOLE SODIUM 40 MG PO TBEC: 40.0000 mg | DELAYED_RELEASE_TABLET | Freq: Every day | ORAL | 1 refills | Status: DC

## 2023-02-18 MED ORDER — PANTOPRAZOLE SODIUM 40 MG PO TBEC
40.0000 mg | DELAYED_RELEASE_TABLET | Freq: Every day | ORAL | 1 refills | Status: DC
Start: 2023-02-18 — End: 2023-02-18

## 2023-02-18 NOTE — Telephone Encounter (Signed)
Filled by previous provided, please advice on refills?

## 2023-02-18 NOTE — Telephone Encounter (Signed)
Rx sent 

## 2023-02-18 NOTE — Telephone Encounter (Signed)
Prescription Request  02/18/2023  LOV: 01/05/2023  What is the name of the medication or equipment? sertraline (ZOLOFT) 50 MG tablet   pantoprazole (PROTONIX) 40 MG tablet   Have you contacted your pharmacy to request a refill? Yes   Which pharmacy would you like this sent to?  [facility] [phone] - [place], [place] - [address] AT [facility] [address], [place] [address] Phone: [phone]  Fax: [phone] DEA #: [id number] DAW Reason: --  Patient notified that their request is being sent to the clinical staff for review and that they should receive a response within 2 business days.   Please advise at [facility] [phone]

## 2023-03-02 ENCOUNTER — Other Ambulatory Visit: Payer: Self-pay | Admitting: Family Medicine

## 2023-03-17 ENCOUNTER — Other Ambulatory Visit: Payer: Self-pay | Admitting: Family Medicine

## 2023-03-19 LAB — CBC WITH DIFFERENTIAL/PLATELET
Basophils Absolute: 0.1 10*3/uL (ref 0.0–0.2)
EOS (ABSOLUTE): 0.4 10*3/uL (ref 0.0–0.4)
Eos: 7 %
Hematocrit: 44.1 % (ref 37.5–51.0)
Immature Grans (Abs): 0 10*3/uL (ref 0.0–0.1)
Lymphocytes Absolute: 1.6 10*3/uL (ref 0.7–3.1)
Lymphs: 28 %
MCH: 29.9 pg (ref 26.6–33.0)
MCHC: 33.1 g/dL (ref 31.5–35.7)
MCV: 90 fL (ref 79–97)
Neutrophils: 55 %
Platelets: 169 10*3/uL (ref 150–450)
RDW: 13.5 % (ref 11.6–15.4)
WBC: 5.6 10*3/uL (ref 3.4–10.8)

## 2023-03-19 LAB — CMP14+EGFR
ALT: 25 IU/L (ref 0–44)
AST: 21 IU/L (ref 0–40)
Alkaline Phosphatase: 68 IU/L (ref 44–121)
Bilirubin Total: <0.2 mg/dL (ref 0.0–1.2)
CO2: 22 mmol/L (ref 20–29)
Creatinine, Ser: 1.05 mg/dL (ref 0.76–1.27)
Globulin, Total: 2.7 g/dL (ref 1.5–4.5)
Potassium: 4.3 mmol/L (ref 3.5–5.2)
Total Protein: 7.3 g/dL (ref 6.0–8.5)

## 2023-03-19 LAB — HEMOGLOBIN A1C: Est. average glucose Bld gHb Est-mCnc: 148 mg/dL

## 2023-03-19 LAB — MICROALBUMIN / CREATININE URINE RATIO

## 2023-03-19 LAB — LIPID PANEL
Chol/HDL Ratio: 5.8 ratio — ABNORMAL HIGH (ref 0.0–5.0)
VLDL Cholesterol Cal: 68 mg/dL — ABNORMAL HIGH (ref 5–40)

## 2023-03-19 LAB — TSH+FREE T4: Free T4: 1.02 ng/dL (ref 0.82–1.77)

## 2023-03-20 ENCOUNTER — Other Ambulatory Visit: Payer: Self-pay | Admitting: Family Medicine

## 2023-03-20 ENCOUNTER — Encounter: Payer: Self-pay | Admitting: Family Medicine

## 2023-03-20 DIAGNOSIS — E119 Type 2 diabetes mellitus without complications: Secondary | ICD-10-CM

## 2023-03-20 LAB — CMP14+EGFR
Albumin: 4.6 g/dL (ref 3.8–4.9)
BUN/Creatinine Ratio: 15 (ref 9–20)
BUN: 16 mg/dL (ref 6–24)
Calcium: 9.5 mg/dL (ref 8.7–10.2)
Chloride: 101 mmol/L (ref 96–106)
Glucose: 108 mg/dL — ABNORMAL HIGH (ref 70–99)
Sodium: 140 mmol/L (ref 134–144)
eGFR: 84 mL/min/{1.73_m2} (ref >59)

## 2023-03-20 LAB — CBC WITH DIFFERENTIAL/PLATELET
Basos: 1 %
Hemoglobin: 14.6 g/dL (ref 13.0–17.7)
Immature Granulocytes: 1 %
Monocytes Absolute: 0.5 10*3/uL (ref 0.1–0.9)
Monocytes: 8 %
Neutrophils Absolute: 3.1 10*3/uL (ref 1.4–7.0)
RBC: 4.88 x10E6/uL (ref 4.14–5.80)

## 2023-03-20 LAB — LIPID PANEL
Cholesterol, Total: 150 mg/dL (ref 100–199)
HDL: 26 mg/dL — ABNORMAL LOW (ref >39)
LDL Chol Calc (NIH): 56 mg/dL (ref 0–99)
Triglycerides: 450 mg/dL — ABNORMAL HIGH (ref 0–149)

## 2023-03-20 LAB — TSH+FREE T4: TSH: 1.85 u[IU]/mL (ref 0.450–4.500)

## 2023-03-20 LAB — MICROALBUMIN / CREATININE URINE RATIO
Microalb/Creat Ratio: 61 mg/g creat — ABNORMAL HIGH (ref 0–29)
Microalbumin, Urine: 53.3 ug/mL

## 2023-03-20 LAB — HEMOGLOBIN A1C: Hgb A1c MFr Bld: 6.8 % — ABNORMAL HIGH (ref 4.8–5.6)

## 2023-03-20 LAB — VITAMIN D 25 HYDROXY (VIT D DEFICIENCY, FRACTURES): Vit D, 25-Hydroxy: 29.8 ng/mL — ABNORMAL LOW (ref 30.0–100.0)

## 2023-03-20 MED ORDER — TRULICITY 1.5 MG/0.5ML ~~LOC~~ SOAJ
1.5000 mg | SUBCUTANEOUS | 0 refills | Status: DC
Start: 2023-03-20 — End: 2023-03-22

## 2023-03-22 ENCOUNTER — Other Ambulatory Visit: Payer: Self-pay

## 2023-03-22 ENCOUNTER — Encounter: Payer: Self-pay | Admitting: Family Medicine

## 2023-03-22 ENCOUNTER — Other Ambulatory Visit: Payer: Self-pay | Admitting: Family Medicine

## 2023-03-22 DIAGNOSIS — E119 Type 2 diabetes mellitus without complications: Secondary | ICD-10-CM

## 2023-03-22 MED ORDER — TRULICITY 1.5 MG/0.5ML ~~LOC~~ SOAJ
1.5000 mg | SUBCUTANEOUS | 0 refills | Status: DC
Start: 2023-03-22 — End: 2023-04-22
  Filled 2023-03-22: qty 2, 28d supply, fill #0

## 2023-03-25 ENCOUNTER — Other Ambulatory Visit: Payer: Self-pay

## 2023-03-26 ENCOUNTER — Other Ambulatory Visit: Payer: Self-pay

## 2023-03-30 ENCOUNTER — Other Ambulatory Visit: Payer: Self-pay | Admitting: Family Medicine

## 2023-04-06 ENCOUNTER — Encounter: Payer: Self-pay | Admitting: Family Medicine

## 2023-04-06 ENCOUNTER — Ambulatory Visit (INDEPENDENT_AMBULATORY_CARE_PROVIDER_SITE_OTHER): Payer: 59 | Admitting: Family Medicine

## 2023-04-06 VITALS — BP 107/70 | HR 93 | Ht 68.0 in | Wt 180.0 lb

## 2023-04-06 DIAGNOSIS — E1100 Type 2 diabetes mellitus with hyperosmolarity without nonketotic hyperglycemic-hyperosmolar coma (NKHHC): Secondary | ICD-10-CM

## 2023-04-06 DIAGNOSIS — E7849 Other hyperlipidemia: Secondary | ICD-10-CM | POA: Diagnosis not present

## 2023-04-06 DIAGNOSIS — I10 Essential (primary) hypertension: Secondary | ICD-10-CM | POA: Diagnosis not present

## 2023-04-06 DIAGNOSIS — Z981 Arthrodesis status: Secondary | ICD-10-CM

## 2023-04-06 DIAGNOSIS — F419 Anxiety disorder, unspecified: Secondary | ICD-10-CM

## 2023-04-06 NOTE — Patient Instructions (Addendum)
I appreciate the opportunity to provide care to you today!    Follow up:  4 months  Labs: next visit   Attached with your AVS, you will find valuable resources for self-education. I highly recommend dedicating some time to thoroughly examine them.   Please continue to a heart-healthy diet and increase your physical activities. Try to exercise for at least five days a week.    It was a pleasure to see you and I look forward to continuing to work together on your health and well-being. Please do not hesitate to call the office if you need care or have questions about your care.  In case of emergency, please visit the Emergency Department for urgent care, or contact our clinic at [phone] to schedule an appointment. We're here to help you!   Have a wonderful day and week. With Gratitude, Gilmore Laroche MSN, FNP-BC

## 2023-04-06 NOTE — Progress Notes (Addendum)
Established Patient Office Visit  Subjective:  Patient ID: Gregory Thompson, male    DOB: December 11, 1965  Age: 57 y.o. MRN: [id number]  CC:  Chief Complaint  Patient presents with   Care Management    Follow up reports leg cramping on left leg.     HPI Gregory Thompson is a 57 y.o. male with past medical history of HTN, T2DM and HLP presents for f/u of  chronic medical conditions. For the details of today's visit, please refer to the assessment and plan.     Past Medical History:  Diagnosis Date   Complication of anesthesia    nausea; Scopalamine helpful 2019   Diabetes mellitus without complication (HCC)    Type II   Fatty liver    referred to [name] Dr. Andrey Campanile ~ 1997, no cirrhosis then by biopsy   Gout    Hx of gallstones    Hypertension    Hypertriglyceridemia    Kidney stone     Past Surgical History:  Procedure Laterality Date   ABDOMINAL EXPOSURE N/A 02/05/2023   Procedure: ABDOMINAL EXPOSURE;  Surgeon: Cephus Shelling, MD;  Location: [facility] OR;  Service: Vascular;  Laterality: N/A;   ANTERIOR LUMBAR FUSION N/A 02/05/2023   Procedure: Anterior Lumbar Interbody Fusion - Lumbar Five-Sacral One;  Surgeon: Julio Sicks, MD;  Location: [facility] OR;  Service: Neurosurgery;  Laterality: N/A;   BILIARY STENT PLACEMENT  02/11/2018   Procedure: BILIARY STENT PLACEMENT;  Surgeon: Sherrilyn Rist, MD;  Location: [facility] ENDOSCOPY;  Service: Gastroenterology;;   CHOLECYSTECTOMY N/A 02/09/2018   Procedure: LAPAROSCOPIC CHOLECYSTECTOMY WITH [name];  Surgeon: Kinsinger, De Blanch, MD;  Location: [facility] OR;  Service: General;  Laterality: N/A;   COLONOSCOPY      remote past by Dr. Karilyn Cota 20 years ago   ENDOSCOPIC RETROGRADE CHOLANGIOPANCREATOGRAPHY (ERCP) WITH PROPOFOL N/A 02/08/2018   Procedure: ENDOSCOPIC RETROGRADE CHOLANGIOPANCREATOGRAPHY (ERCP) WITH PROPOFOL;  Surgeon: Sherrilyn Rist, MD;  Location[facility] ENDOSCOPY;  Service: Gastroenterology;  Laterality: N/A;   ENDOSCOPIC RETROGRADE  CHOLANGIOPANCREATOGRAPHY (ERCP) WITH PROPOFOL N/A 02/11/2018   Procedure: ENDOSCOPIC RETROGRADE CHOLANGIOPANCREATOGRAPHY (ERCP) WITH PROPOFOL;  Surgeon: Sherrilyn Rist, MD;  Location: [facility] ENDOSCOPY;  Service: Gastroenterology;  Laterality: N/A;   ESOPHAGOGASTRODUODENOSCOPY     EGD 20 years ago   LIVER BIOPSY     REMOVAL OF STONES  02/11/2018   Procedure: REMOVAL OF STONES;  Surgeon: Sherrilyn Rist, MD;  Location: [facility] ENDOSCOPY;  Service: Gastroenterology;;   [name]  02/08/2018   Procedure: SPHINCTEROTOMY;  Surgeon: Sherrilyn Rist, MD;  Location: [facility] ENDOSCOPY;  Service: Gastroenterology;;   [name]  02/11/2018   Procedure: SPHINCTEROTOMY;  Surgeon: Sherrilyn Rist, MD;  Location: [facility] ENDOSCOPY;  Service: Gastroenterology;;   VASECTOMY      Family History  Problem Relation Age of Onset   Hyperlipidemia Father    Diabetes Paternal Grandfather    Colon cancer Neg Hx    Colon polyps Neg Hx    Liver disease Neg Hx     Social History   Socioeconomic History   Marital status: Married    Spouse name: Dois Thompson   Number of children: 2   Years of education: Not on file   Highest education level: 12th grade  Occupational History   Not on file  Tobacco Use   Smoking status: Former    Types: Cigarettes   Smokeless tobacco: Current    Types: Chew  Vaping Use   Vaping status: Never  Used  Substance and Sexual Activity   Alcohol use: No   Drug use: No   Sexual activity: Yes  Other Topics Concern   Not on file  Social History Narrative   Not on file   Social Determinants of Health   Financial Resource Strain: Medium Risk (04/02/2023)   Overall Financial Resource Strain (CARDIA)    Difficulty of Paying Living Expenses: Somewhat hard  Food Insecurity: Food Insecurity Present (04/02/2023)   Hunger Vital Sign    Worried About Running Out of Food in the Last Year: Sometimes true    Ran Out of Food in the Last Year: Never true  Transportation Needs: No Transportation  Needs (04/02/2023)   PRAPARE - Administrator, Civil Service (Medical): No    Lack of Transportation (Non-Medical): No  Physical Activity: Sufficiently Active (04/02/2023)   Exercise Vital Sign    Days of Exercise per Week: 3 days    Minutes of Exercise per Session: 60 min  Stress: No Stress Concern Present (04/02/2023)   [employer] of Occupational Health - Occupational Stress Questionnaire    Feeling of Stress : Only a little  Social Connections: Moderately Isolated (04/02/2023)   Social Connection and Isolation Panel [NHANES]    Frequency of Communication with Friends and Family: More than three times a week    Frequency of Social Gatherings with Friends and Family: Never    Attends Religious Services: Never    Database administrator or Organizations: No    Attends Engineer, structural: Not on file    Marital Status: Married  Intimate Partner Violence: Unknown (12/10/2021)   Received from [facility]   HITS    Physically Hurt: Not on file    Insult or Talk Down To: Not on file    Threaten Physical Harm: Not on file    Scream or Curse: Not on file    Outpatient Medications Prior to Visit  Medication Sig Dispense Refill   acetaminophen (TYLENOL) 500 MG tablet Take 1,000 mg by mouth every 6 (six) hours as needed for moderate pain.     allopurinol (ZYLOPRIM) 300 MG tablet TAKE 1 TABLET(300 MG) BY MOUTH IN THE MORNING 90 tablet 1   APPLE CIDER VINEGAR PO Take 1 capsule by mouth 2 (two) times daily.     Ascorbic Acid (VITAMIN C) 1000 MG tablet Take 1,000 mg by mouth 2 (two) times daily.     Blood Glucose Monitoring Suppl MISC Glucose testing supplies-test once a day 50 each 11   CINNAMON PO Take 1,000 mg by mouth 2 (two) times daily.     Dulaglutide (TRULICITY) 1.5 MG/0.5ML SOPN Inject 1.5 mg into the skin once a week. 2 mL 0   empagliflozin (JARDIANCE) 25 MG TABS tablet Take one tablet po daily 90 tablet 1   gemfibrozil (LOPID) 600 MG tablet TAKE 1 TABLET BY  MOUTH EVERY OTHER DAY (Patient taking differently: Take 600 mg by mouth daily.) 45 tablet 0   glipiZIDE (GLUCOTROL XL) 2.5 MG 24 hr tablet Take 2.5 mg by mouth daily.     hyoscyamine (LEVBID) 0.375 MG 12 hr tablet TAKE 1 TABLET BY MOUTH EVERY 12 HOURS AS NEEDED 180 tablet 0   losartan (COZAAR) 50 MG tablet TAKE 1 TABLET(50 MG) BY MOUTH DAILY 90 tablet 1   Melatonin 10 MG CAPS Take 10 mg by mouth at bedtime as needed (sleep).     metFORMIN (GLUCOPHAGE-XR) 500 MG 24 hr tablet Take 2 tablets by  mouth daily (Patient taking differently: Take 500-1,000 mg by mouth See admin instructions. Take 1000 mg in the morning and 500 mg in the evening) 60 tablet 5   Omega-3 Fatty Acids (OMEGA-3 FISH OIL PO) Take 2,000 mg by mouth daily.     ONE TOUCH ULTRA TEST test strip TEST ONCE DAILY AS DIRECTED 50 each 4   pantoprazole (PROTONIX) 40 MG tablet Take 1 tablet (40 mg total) by mouth daily. 60 tablet 1   Phenylephrine-APAP-guaiFENesin (TYLENOL SINUS SEVERE PO) Take 2 tablets by mouth daily as needed (congestion).     pravastatin (PRAVACHOL) 40 MG tablet Take 40 mg by mouth daily.     Probiotic Product (PROBIOTIC PO) Take 1 capsule by mouth daily.     sertraline (ZOLOFT) 50 MG tablet 1 qd 30 tablet 5   SIMPLY SALINE NA Place 1 spray into the nose daily as needed (congestion).     cyclobenzaprine (FLEXERIL) 10 MG tablet Take 1 tablet (10 mg total) by mouth 3 (three) times daily as needed for muscle spasms. 30 tablet 0   No facility-administered medications prior to visit.    Allergies  Allergen Reactions   Eluxadoline Other (See Comments)    Low blood sugar    Azithromycin     Abdominal pain   Cefzil [Cefprozil]     Abdominal pain   Lisinopril Cough   Penicillins     ROS Review of Systems  Constitutional:  Negative for fatigue and fever.  Eyes:  Negative for visual disturbance.  Respiratory:  Negative for chest tightness and shortness of breath.   Cardiovascular:  Negative for chest pain and  palpitations.  Neurological:  Negative for dizziness and headaches.      Objective:    Physical Exam HENT:     Head: Normocephalic.     Right Ear: External ear normal.     Left Ear: External ear normal.     Nose: No congestion or rhinorrhea.     Mouth/Throat:     Mouth: Mucous membranes are moist.  Cardiovascular:     Rate and Rhythm: Regular rhythm.     Heart sounds: No murmur heard. Pulmonary:     Effort: No respiratory distress.     Breath sounds: Normal breath sounds.  Neurological:     Mental Status: He is alert.     BP 107/70   Pulse 93   Ht 5\' 8"  (1.727 m)   Wt 180 lb 0.6 oz (81.7 kg)   SpO2 94%   BMI 27.37 kg/m  Wt Readings from Last 3 Encounters:  04/06/23 180 lb 0.6 oz (81.7 kg)  02/05/23 176 lb (79.8 kg)  01/26/23 176 lb (79.8 kg)    Lab Results  Component Value Date   TSH 1.850 03/18/2023   Lab Results  Component Value Date   WBC 5.6 03/18/2023   HGB 14.6 03/18/2023   HCT 44.1 03/18/2023   MCV 90 03/18/2023   PLT 169 03/18/2023   Lab Results  Component Value Date   NA 140 03/18/2023   K 4.3 03/18/2023   CO2 22 03/18/2023   GLUCOSE 108 (H) 03/18/2023   BUN 16 03/18/2023   CREATININE 1.05 03/18/2023   BILITOT <0.2 03/18/2023   ALKPHOS 68 03/18/2023   AST 21 03/18/2023   ALT 25 03/18/2023   PROT 7.3 03/18/2023   ALBUMIN 4.6 03/18/2023   CALCIUM 9.5 03/18/2023   ANIONGAP 12 01/26/2023   EGFR 84 03/18/2023   Lab Results  Component Value Date  CHOL 150 03/18/2023   Lab Results  Component Value Date   HDL 26 (L) 03/18/2023   Lab Results  Component Value Date   LDLCALC 56 03/18/2023   Lab Results  Component Value Date   TRIG 450 (H) 03/18/2023   Lab Results  Component Value Date   CHOLHDL 5.8 (H) 03/18/2023   Lab Results  Component Value Date   HGBA1C 6.8 (H) 03/18/2023      Assessment & Plan:  Primary hypertension Assessment & Plan: Controlled He takes losartan 50 mg daily Asymptomatic today Low-sodium diet  with increased physical activity encouraged BP Readings from Last 3 Encounters:  04/06/23 107/70  02/06/23 115/76  01/26/23 114/76      S/P lumbar and lumbosacral fusion by anterior technique Assessment & Plan: Had back surgery on 02/05/2023 Has followed up with his surgeon with c/o of leg cramps in the left leg The pt reports that he was informed by the surgeon that this is likely from his back surgery  Encouraged stretching exercises and avoiding prolonged sitting   Type 2 diabetes mellitus with hyperosmolarity without coma, without long-term current use of insulin (HCC) Assessment & Plan: Reports taking Trulicity 1.5 mg weekly,Jardiance 25 mg, metformin 500 mg daily and glipizide 2.5 mg daily Denies polyuria, polyphagia,polydispsia Pending hemoglobin A1c Lab Results  Component Value Date   HGBA1C 6.8 (H) 03/18/2023      Other hyperlipidemia Assessment & Plan: Takes gemfibrozol 600 mg daily, pravastatin 40 mg daily Encouraged a heart healthy diet with increased physical activity Lab Results  Component Value Date   CHOL 150 03/18/2023   HDL 26 (L) 03/18/2023   LDLCALC 56 03/18/2023   TRIG 450 (H) 03/18/2023   CHOLHDL 5.8 (H) 03/18/2023      Anxiety Assessment & Plan: He takes Zoloft 50mg  daily Denies SI GAD 7 is 8    Note: This chart has been completed using [employer] software, and while attempts have been made to ensure accuracy, certain words and phrases may not be transcribed as intended.    Follow-up: Return in about 4 months (around 08/07/2023).   Gilmore Laroche, FNP

## 2023-04-09 NOTE — Assessment & Plan Note (Addendum)
Had back surgery on 02/05/2023 Has followed up with his surgeon with c/o of leg cramps in the left leg The pt reports that he was informed by the surgeon that this is likely from his back surgery  Encouraged stretching exercises and avoiding prolonged sitting

## 2023-04-09 NOTE — Assessment & Plan Note (Signed)
Controlled He takes losartan 50 mg daily Asymptomatic today Low-sodium diet with increased physical activity encouraged BP Readings from Last 3 Encounters:  04/06/23 107/70  02/06/23 115/76  01/26/23 114/76

## 2023-04-09 NOTE — Assessment & Plan Note (Addendum)
Takes gemfibrozol 600 mg daily, pravastatin 40 mg daily Encouraged a heart healthy diet with increased physical activity Lab Results  Component Value Date   CHOL 150 03/18/2023   HDL 26 (L) 03/18/2023   LDLCALC 56 03/18/2023   TRIG 450 (H) 03/18/2023   CHOLHDL 5.8 (H) 03/18/2023

## 2023-04-09 NOTE — Assessment & Plan Note (Signed)
He takes Zoloft 50mg  daily Denies SI GAD 7 is 8

## 2023-04-09 NOTE — Assessment & Plan Note (Addendum)
Reports taking Trulicity 1.5 mg weekly,Jardiance 25 mg, metformin 500 mg daily and glipizide 2.5 mg daily Denies polyuria, polyphagia,polydispsia Pending hemoglobin A1c Lab Results  Component Value Date   HGBA1C 6.8 (H) 03/18/2023

## 2023-04-21 ENCOUNTER — Encounter: Payer: Self-pay | Admitting: Family Medicine

## 2023-04-22 ENCOUNTER — Other Ambulatory Visit: Payer: Self-pay | Admitting: Family Medicine

## 2023-04-22 ENCOUNTER — Other Ambulatory Visit: Payer: Self-pay

## 2023-04-22 DIAGNOSIS — E119 Type 2 diabetes mellitus without complications: Secondary | ICD-10-CM

## 2023-04-22 MED ORDER — TRULICITY 3 MG/0.5ML ~~LOC~~ SOAJ
3.0000 mg | SUBCUTANEOUS | 0 refills | Status: DC
Start: 2023-04-22 — End: 2023-05-24
  Filled 2023-04-22: qty 2, 28d supply, fill #0

## 2023-04-27 ENCOUNTER — Encounter: Payer: Self-pay | Admitting: Family Medicine

## 2023-04-29 ENCOUNTER — Other Ambulatory Visit: Payer: Self-pay | Admitting: Family Medicine

## 2023-04-29 DIAGNOSIS — E119 Type 2 diabetes mellitus without complications: Secondary | ICD-10-CM

## 2023-04-29 MED ORDER — METFORMIN HCL ER 500 MG PO TB24
500.0000 mg | ORAL_TABLET | Freq: Every day | ORAL | 5 refills | Status: DC
Start: 2023-04-29 — End: 2023-10-08

## 2023-04-29 NOTE — Telephone Encounter (Signed)
Could you please print a copy of the patient's blood work, including the most recent A1C results, for his DOT physical? Kindly inform him when the documents will be ready for pickup.

## 2023-05-04 ENCOUNTER — Encounter: Payer: Self-pay | Admitting: Family Medicine

## 2023-05-04 ENCOUNTER — Other Ambulatory Visit: Payer: Self-pay

## 2023-05-04 DIAGNOSIS — E119 Type 2 diabetes mellitus without complications: Secondary | ICD-10-CM

## 2023-05-04 MED ORDER — EMPAGLIFLOZIN 25 MG PO TABS
ORAL_TABLET | ORAL | 1 refills | Status: DC
Start: 2023-05-04 — End: 2023-10-08

## 2023-05-21 ENCOUNTER — Ambulatory Visit (INDEPENDENT_AMBULATORY_CARE_PROVIDER_SITE_OTHER): Payer: 59

## 2023-05-21 DIAGNOSIS — E1165 Type 2 diabetes mellitus with hyperglycemia: Secondary | ICD-10-CM

## 2023-05-21 DIAGNOSIS — E119 Type 2 diabetes mellitus without complications: Secondary | ICD-10-CM

## 2023-05-21 LAB — HM DIABETES EYE EXAM

## 2023-05-21 NOTE — Progress Notes (Signed)
Gregory Thompson arrived 05/21/2023 and has given verbal consent to obtain images and complete their overdue diabetic retinal screening.  The images have been sent to an ophthalmologist or optometrist for review and interpretation.  Results will be sent back to Gilmore Laroche, FNP for review.  Patient has been informed they will be contacted when we receive the results via telephone or MyChart

## 2023-05-23 ENCOUNTER — Encounter: Payer: Self-pay | Admitting: Family Medicine

## 2023-05-24 ENCOUNTER — Other Ambulatory Visit: Payer: Self-pay

## 2023-05-24 DIAGNOSIS — E119 Type 2 diabetes mellitus without complications: Secondary | ICD-10-CM

## 2023-05-24 MED ORDER — TRULICITY 3 MG/0.5ML ~~LOC~~ SOAJ
3.0000 mg | SUBCUTANEOUS | 0 refills | Status: DC
Start: 2023-05-24 — End: 2023-06-28
  Filled 2023-05-24: qty 2, 28d supply, fill #0

## 2023-05-28 ENCOUNTER — Other Ambulatory Visit: Payer: Self-pay

## 2023-06-12 ENCOUNTER — Encounter: Payer: Self-pay | Admitting: Family Medicine

## 2023-06-14 ENCOUNTER — Other Ambulatory Visit: Payer: Self-pay

## 2023-06-14 DIAGNOSIS — K219 Gastro-esophageal reflux disease without esophagitis: Secondary | ICD-10-CM

## 2023-06-14 MED ORDER — PANTOPRAZOLE SODIUM 40 MG PO TBEC
40.0000 mg | DELAYED_RELEASE_TABLET | Freq: Every day | ORAL | 1 refills | Status: DC
Start: 2023-06-14 — End: 2023-10-08

## 2023-06-27 ENCOUNTER — Encounter: Payer: Self-pay | Admitting: Family Medicine

## 2023-06-28 ENCOUNTER — Other Ambulatory Visit: Payer: Self-pay

## 2023-06-28 DIAGNOSIS — E119 Type 2 diabetes mellitus without complications: Secondary | ICD-10-CM

## 2023-06-28 MED ORDER — TRULICITY 3 MG/0.5ML ~~LOC~~ SOAJ
3.0000 mg | SUBCUTANEOUS | 0 refills | Status: DC
Start: 2023-06-28 — End: 2023-06-28

## 2023-06-28 MED ORDER — TRULICITY 3 MG/0.5ML ~~LOC~~ SOAJ
3.0000 mg | SUBCUTANEOUS | 0 refills | Status: DC
Start: 2023-06-28 — End: 2023-07-26
  Filled 2023-06-28: qty 2, 28d supply, fill #0

## 2023-06-29 ENCOUNTER — Other Ambulatory Visit: Payer: Self-pay

## 2023-07-01 ENCOUNTER — Other Ambulatory Visit: Payer: Self-pay

## 2023-07-08 ENCOUNTER — Other Ambulatory Visit: Payer: Self-pay | Admitting: Family Medicine

## 2023-07-12 MED ORDER — HYOSCYAMINE SULFATE ER 0.375 MG PO TB12: 0.3750 mg | ORAL_TABLET | Freq: Two times a day (BID) | ORAL | 0 refills | Status: DC | PRN

## 2023-07-15 ENCOUNTER — Other Ambulatory Visit: Payer: Self-pay

## 2023-07-15 ENCOUNTER — Encounter: Payer: Self-pay | Admitting: Family Medicine

## 2023-07-15 DIAGNOSIS — E1165 Type 2 diabetes mellitus with hyperglycemia: Secondary | ICD-10-CM

## 2023-07-15 DIAGNOSIS — E559 Vitamin D deficiency, unspecified: Secondary | ICD-10-CM

## 2023-07-15 DIAGNOSIS — I10 Essential (primary) hypertension: Secondary | ICD-10-CM

## 2023-07-15 DIAGNOSIS — E7849 Other hyperlipidemia: Secondary | ICD-10-CM

## 2023-07-15 DIAGNOSIS — E0789 Other specified disorders of thyroid: Secondary | ICD-10-CM

## 2023-07-21 ENCOUNTER — Other Ambulatory Visit: Payer: Self-pay

## 2023-07-21 ENCOUNTER — Encounter: Payer: Self-pay | Admitting: Family Medicine

## 2023-07-21 DIAGNOSIS — I10 Essential (primary) hypertension: Secondary | ICD-10-CM

## 2023-07-21 MED ORDER — LOSARTAN POTASSIUM 50 MG PO TABS: ORAL_TABLET | ORAL | 1 refills | Status: DC

## 2023-07-25 ENCOUNTER — Encounter: Payer: Self-pay | Admitting: Family Medicine

## 2023-07-26 ENCOUNTER — Other Ambulatory Visit: Payer: Self-pay

## 2023-07-26 DIAGNOSIS — E119 Type 2 diabetes mellitus without complications: Secondary | ICD-10-CM

## 2023-07-26 MED ORDER — TRULICITY 3 MG/0.5ML ~~LOC~~ SOAJ
3.0000 mg | SUBCUTANEOUS | 0 refills | Status: DC
  Filled 2023-07-26: qty 2, 28d supply, fill #0

## 2023-07-28 LAB — CMP14+EGFR
ALT: 36 [IU]/L (ref 0–44)
AST: 33 [IU]/L (ref 0–40)
Albumin: 4.9 g/dL (ref 3.8–4.9)
Alkaline Phosphatase: 64 [IU]/L (ref 44–121)
BUN/Creatinine Ratio: 13 (ref 9–20)
BUN: 16 mg/dL (ref 6–24)
Bilirubin Total: 0.4 mg/dL (ref 0.0–1.2)
CO2: 21 mmol/L (ref 20–29)
Calcium: 10.3 mg/dL — ABNORMAL HIGH (ref 8.7–10.2)
Chloride: 100 mmol/L (ref 96–106)
Creatinine, Ser: 1.22 mg/dL (ref 0.76–1.27)
Globulin, Total: 3.3 g/dL (ref 1.5–4.5)
Glucose: 96 mg/dL (ref 70–99)
Potassium: 4.3 mmol/L (ref 3.5–5.2)
Sodium: 138 mmol/L (ref 134–144)
Total Protein: 8.2 g/dL (ref 6.0–8.5)
eGFR: 70 mL/min/{1.73_m2} (ref >59)

## 2023-07-28 LAB — CBC WITH DIFFERENTIAL/PLATELET
Basophils Absolute: 0.1 10*3/uL (ref 0.0–0.2)
Basos: 1 %
EOS (ABSOLUTE): 0.4 10*3/uL (ref 0.0–0.4)
Eos: 5 %
Hematocrit: 49.7 % (ref 37.5–51.0)
Hemoglobin: 16.5 g/dL (ref 13.0–17.7)
Immature Grans (Abs): 0 10*3/uL (ref 0.0–0.1)
Immature Granulocytes: 0 %
Lymphocytes Absolute: 1.9 10*3/uL (ref 0.7–3.1)
Lymphs: 23 %
MCH: 29.4 pg (ref 26.6–33.0)
MCHC: 33.2 g/dL (ref 31.5–35.7)
MCV: 88 fL (ref 79–97)
Monocytes Absolute: 0.6 10*3/uL (ref 0.1–0.9)
Monocytes: 7 %
Neutrophils Absolute: 5.3 10*3/uL (ref 1.4–7.0)
Neutrophils: 64 %
Platelets: 292 10*3/uL (ref 150–450)
RBC: 5.62 x10E6/uL (ref 4.14–5.80)
RDW: 13.5 % (ref 11.6–15.4)
WBC: 8.3 10*3/uL (ref 3.4–10.8)

## 2023-07-28 LAB — TSH+FREE T4
Free T4: 1.03 ng/dL (ref 0.82–1.77)
TSH: 1.22 u[IU]/mL (ref 0.450–4.500)

## 2023-07-28 LAB — LIPID PANEL
Chol/HDL Ratio: 5.7 ratio — ABNORMAL HIGH (ref 0.0–5.0)
Cholesterol, Total: 178 mg/dL (ref 100–199)
HDL: 31 mg/dL — ABNORMAL LOW (ref >39)
LDL Chol Calc (NIH): 75 mg/dL (ref 0–99)
Triglycerides: 452 mg/dL — ABNORMAL HIGH (ref 0–149)
VLDL Cholesterol Cal: 72 mg/dL — ABNORMAL HIGH (ref 5–40)

## 2023-07-28 LAB — HEMOGLOBIN A1C
Est. average glucose Bld gHb Est-mCnc: 128 mg/dL
Hgb A1c MFr Bld: 6.1 % — ABNORMAL HIGH (ref 4.8–5.6)

## 2023-07-28 LAB — VITAMIN D 25 HYDROXY (VIT D DEFICIENCY, FRACTURES): Vit D, 25-Hydroxy: 28.5 ng/mL — ABNORMAL LOW (ref 30.0–100.0)

## 2023-07-29 ENCOUNTER — Other Ambulatory Visit: Payer: Self-pay | Admitting: Family Medicine

## 2023-07-29 DIAGNOSIS — E559 Vitamin D deficiency, unspecified: Secondary | ICD-10-CM

## 2023-07-29 MED ORDER — VITAMIN D (ERGOCALCIFEROL) 1.25 MG (50000 UNIT) PO CAPS: 50000.0000 [IU] | ORAL_CAPSULE | ORAL | 1 refills | Status: DC

## 2023-07-29 NOTE — Progress Notes (Signed)
The 10-year ASCVD risk score (Arnett DK, et al., 2019) is: 16.9%   Values used to calculate the score:     Age: 57 years     Sex: Male     Is Non-Hispanic African American: No     Diabetic: Yes     Tobacco smoker: No     Systolic Blood Pressure: 126 mmHg     Is BP treated: Yes     HDL Cholesterol: 31 mg/dL     Total Cholesterol: 178 mg/dL

## 2023-07-30 ENCOUNTER — Other Ambulatory Visit: Payer: Self-pay

## 2023-08-03 ENCOUNTER — Encounter: Payer: Self-pay | Admitting: Family Medicine

## 2023-08-03 ENCOUNTER — Ambulatory Visit (INDEPENDENT_AMBULATORY_CARE_PROVIDER_SITE_OTHER): Payer: 59 | Admitting: Family Medicine

## 2023-08-03 VITALS — BP 116/86 | HR 76 | Wt 178.0 lb

## 2023-08-03 DIAGNOSIS — I10 Essential (primary) hypertension: Secondary | ICD-10-CM

## 2023-08-03 DIAGNOSIS — K219 Gastro-esophageal reflux disease without esophagitis: Secondary | ICD-10-CM

## 2023-08-03 DIAGNOSIS — Z7984 Long term (current) use of oral hypoglycemic drugs: Secondary | ICD-10-CM

## 2023-08-03 DIAGNOSIS — E7849 Other hyperlipidemia: Secondary | ICD-10-CM | POA: Diagnosis not present

## 2023-08-03 DIAGNOSIS — Z111 Encounter for screening for respiratory tuberculosis: Secondary | ICD-10-CM

## 2023-08-03 DIAGNOSIS — Z1211 Encounter for screening for malignant neoplasm of colon: Secondary | ICD-10-CM

## 2023-08-03 DIAGNOSIS — Z720 Tobacco use: Secondary | ICD-10-CM

## 2023-08-03 DIAGNOSIS — E1165 Type 2 diabetes mellitus with hyperglycemia: Secondary | ICD-10-CM

## 2023-08-03 NOTE — Progress Notes (Signed)
Established Patient Office Visit  Subjective:  Patient ID: Gregory Thompson, male    DOB: 1966-06-15  Age: 57 y.o. MRN: [id number]  CC:  Chief Complaint  Patient presents with   Follow-up    HPI Gregory Thompson is a 57 y.o. male with past medical history of hypertension, GERD, type 2 diabetes, hyperlipidemia and vitamin D deficiency presents for f/u of  chronic medical conditions. For the details of today's visit, please refer to the assessment and plan.     Past Medical History:  Diagnosis Date   Complication of anesthesia    nausea; Scopalamine helpful 2019   Diabetes mellitus without complication (HCC)    Type II   Fatty liver    referred to [facility] Dr. Andrey Campanile ~ 1997, no cirrhosis then by biopsy   Gout    Hx of gallstones    Hypertension    Hypertriglyceridemia    Kidney stone     Past Surgical History:  Procedure Laterality Date   ABDOMINAL EXPOSURE N/A 02/05/2023   Procedure: ABDOMINAL EXPOSURE;  Surgeon: Cephus Shelling, MD;  Location: [facility] OR;  Service: Vascular;  Laterality: N/A;   ANTERIOR LUMBAR FUSION N/A 02/05/2023   Procedure: Anterior Lumbar Interbody Fusion - Lumbar Five-Sacral One;  Surgeon: Julio Sicks, MD;  Location: [facility] OR;  Service: Neurosurgery;  Laterality: N/A;   BILIARY STENT PLACEMENT  02/11/2018   Procedure: BILIARY STENT PLACEMENT;  Surgeon: Sherrilyn Rist, MD;  Location: [facility] ENDOSCOPY;  Service: Gastroenterology;;   CHOLECYSTECTOMY N/A 02/09/2018   Procedure: LAPAROSCOPIC CHOLECYSTECTOMY WITH [name];  Surgeon: Kinsinger, De Blanch, MD;  Location: [facility] OR;  Service: General;  Laterality: N/A;   COLONOSCOPY      remote past by Dr. Karilyn Cota 20 years ago   ENDOSCOPIC RETROGRADE CHOLANGIOPANCREATOGRAPHY (ERCP) WITH PROPOFOL N/A 02/08/2018   Procedure: ENDOSCOPIC RETROGRADE CHOLANGIOPANCREATOGRAPHY (ERCP) WITH PROPOFOL;  Surgeon: Sherrilyn Rist, MD;  Location: [facility] ENDOSCOPY;  Service: Gastroenterology;  Laterality: N/A;   ENDOSCOPIC RETROGRADE  CHOLANGIOPANCREATOGRAPHY (ERCP) WITH PROPOFOL N/A 02/11/2018   Procedure: ENDOSCOPIC RETROGRADE CHOLANGIOPANCREATOGRAPHY (ERCP) WITH PROPOFOL;  Surgeon: Sherrilyn Rist, MD;  Location: [facility] ENDOSCOPY;  Service: Gastroenterology;  Laterality: N/A;   ESOPHAGOGASTRODUODENOSCOPY     EGD 20 years ago   LIVER BIOPSY     REMOVAL OF STONES  02/11/2018   Procedure: REMOVAL OF STONES;  Surgeon: Sherrilyn Rist, MD;  Location: [facility] ENDOSCOPY;  Service: Gastroenterology;;   [name]  02/08/2018   Procedure: SPHINCTEROTOMY;  Surgeon: Sherrilyn Rist, MD;  Location[facility] ENDOSCOPY;  Service: Gastroenterology;;   [name]  02/11/2018   Procedure: SPHINCTEROTOMY;  Surgeon: Sherrilyn Rist, MD;  Location: [facility] ENDOSCOPY;  Service: Gastroenterology;;   VASECTOMY      Family History  Problem Relation Age of Onset   Hyperlipidemia Father    Diabetes Paternal Grandfather    Colon cancer Neg Hx    Colon polyps Neg Hx    Liver disease Neg Hx     Social History   Socioeconomic History   Marital status: Married    Spouse name: Dois Davenport   Number of children: 2   Years of education: Not on file   Highest education level: 12th grade  Occupational History   Not on file  Tobacco Use   Smoking status: Former    Types: Cigarettes   Smokeless tobacco: Current    Types: Chew  Vaping Use   Vaping status: Never Used  Substance and Sexual Activity  Alcohol use: No   Drug use: No   Sexual activity: Yes  Other Topics Concern   Not on file  Social History Narrative   Not on file   Social Determinants of Health   Financial Resource Strain: Low Risk  (08/01/2023)   Overall Financial Resource Strain (CARDIA)    Difficulty of Paying Living Expenses: Not very hard  Food Insecurity: No Food Insecurity (08/01/2023)   Hunger Vital Sign    Worried About Running Out of Food in the Last Year: Never true    Ran Out of Food in the Last Year: Never true  Transportation Needs: No Transportation Needs  (08/01/2023)   PRAPARE - Administrator, Civil Service (Medical): No    Lack of Transportation (Non-Medical): No  Physical Activity: Insufficiently Active (08/01/2023)   Exercise Vital Sign    Days of Exercise per Week: 1 day    Minutes of Exercise per Session: 30 min  Stress: No Stress Concern Present (08/01/2023)   [employer] of Occupational Health - Occupational Stress Questionnaire    Feeling of Stress : Only a little  Social Connections: Unknown (08/01/2023)   Social Connection and Isolation Panel [NHANES]    Frequency of Communication with Friends and Family: More than three times a week    Frequency of Social Gatherings with Friends and Family: Twice a week    Attends Religious Services: Patient declined    Database administrator or Organizations: No    Attends Engineer, structural: Not on file    Marital Status: Married  Intimate Partner Violence: Unknown (12/10/2021)   Received from [employer], [facility]   HITS    Physically Hurt: Not on file    Insult or Talk Down To: Not on file    Threaten Physical Harm: Not on file    Scream or Curse: Not on file    Outpatient Medications Prior to Visit  Medication Sig Dispense Refill   acetaminophen (TYLENOL) 500 MG tablet Take 1,000 mg by mouth every 6 (six) hours as needed for moderate pain.     allopurinol (ZYLOPRIM) 300 MG tablet TAKE 1 TABLET(300 MG) BY MOUTH IN THE MORNING 90 tablet 1   Blood Glucose Monitoring Suppl MISC Glucose testing supplies-test once a day 50 each 11   Dulaglutide (TRULICITY) 3 MG/0.5ML SOAJ Inject 3 mg as directed once a week. 2 mL 0   empagliflozin (JARDIANCE) 25 MG TABS tablet Take one tablet po daily 90 tablet 1   gemfibrozil (LOPID) 600 MG tablet TAKE 1 TABLET BY MOUTH EVERY OTHER DAY (Patient taking differently: Take 600 mg by mouth daily.) 45 tablet 0   glipiZIDE (GLUCOTROL XL) 2.5 MG 24 hr tablet Take 2.5 mg by mouth daily.     hyoscyamine (LEVBID) 0.375 MG 12 hr  tablet Take 1 tablet (0.375 mg total) by mouth every 12 (twelve) hours as needed. 180 tablet 0   losartan (COZAAR) 50 MG tablet TAKE 1 TABLET(50 MG) BY MOUTH DAILY 90 tablet 1   Melatonin 10 MG CAPS Take 10 mg by mouth at bedtime as needed (sleep).     metFORMIN (GLUCOPHAGE-XR) 500 MG 24 hr tablet Take 1 tablet (500 mg total) by mouth daily. 60 tablet 5   Omega-3 Fatty Acids (OMEGA-3 FISH OIL PO) Take 2,000 mg by mouth daily.     ONE TOUCH ULTRA TEST test strip TEST ONCE DAILY AS DIRECTED 50 each 4   pantoprazole (PROTONIX) 40 MG tablet Take 1 tablet (  40 mg total) by mouth daily. 90 tablet 1   Phenylephrine-APAP-guaiFENesin (TYLENOL SINUS SEVERE PO) Take 2 tablets by mouth daily as needed (congestion).     pravastatin (PRAVACHOL) 40 MG tablet Take 40 mg by mouth daily.     Probiotic Product (PROBIOTIC PO) Take 1 capsule by mouth daily.     sertraline (ZOLOFT) 50 MG tablet 1 qd 30 tablet 5   SIMPLY SALINE NA Place 1 spray into the nose daily as needed (congestion).     Vitamin D, Ergocalciferol, (DRISDOL) 1.25 MG (50000 UNIT) CAPS capsule Take 1 capsule (50,000 Units total) by mouth every 7 (seven) days. 20 capsule 1   APPLE CIDER VINEGAR PO Take 1 capsule by mouth 2 (two) times daily. (Patient not taking: Reported on 08/03/2023)     Ascorbic Acid (VITAMIN C) 1000 MG tablet Take 1,000 mg by mouth 2 (two) times daily. (Patient not taking: Reported on 08/03/2023)     CINNAMON PO Take 1,000 mg by mouth 2 (two) times daily. (Patient not taking: Reported on 08/03/2023)     No facility-administered medications prior to visit.    Allergies  Allergen Reactions   Eluxadoline Other (See Comments)    Low blood sugar    Azithromycin     Abdominal pain   Cefzil [Cefprozil]     Abdominal pain   Lisinopril Cough   Penicillins     ROS Review of Systems  Constitutional:  Negative for fatigue and fever.  Eyes:  Negative for visual disturbance.  Respiratory:  Negative for chest tightness and  shortness of breath.   Cardiovascular:  Negative for chest pain and palpitations.  Neurological:  Negative for dizziness and headaches.      Objective:    Physical Exam HENT:     Head: Normocephalic.     Right Ear: External ear normal.     Left Ear: External ear normal.     Nose: No congestion or rhinorrhea.     Mouth/Throat:     Mouth: Mucous membranes are moist.  Cardiovascular:     Rate and Rhythm: Regular rhythm.     Heart sounds: No murmur heard. Pulmonary:     Effort: No respiratory distress.     Breath sounds: Normal breath sounds.  Musculoskeletal:     Comments: Bilateral syndactyly  Skin:    General: Skin is warm.  Neurological:     Mental Status: He is alert.     BP 116/86   Pulse 76   Wt 178 lb (80.7 kg)   SpO2 98%   BMI 27.06 kg/m  Wt Readings from Last 3 Encounters:  08/03/23 178 lb (80.7 kg)  04/06/23 180 lb 0.6 oz (81.7 kg)  02/05/23 176 lb (79.8 kg)    Lab Results  Component Value Date   TSH 1.220 07/27/2023   Lab Results  Component Value Date   WBC 8.3 07/27/2023   HGB 16.5 07/27/2023   HCT 49.7 07/27/2023   MCV 88 07/27/2023   PLT 292 07/27/2023   Lab Results  Component Value Date   NA 138 07/27/2023   K 4.3 07/27/2023   CO2 21 07/27/2023   GLUCOSE 96 07/27/2023   BUN 16 07/27/2023   CREATININE 1.22 07/27/2023   BILITOT 0.4 07/27/2023   ALKPHOS 64 07/27/2023   AST 33 07/27/2023   ALT 36 07/27/2023   PROT 8.2 07/27/2023   ALBUMIN 4.9 07/27/2023   CALCIUM 10.3 (H) 07/27/2023   ANIONGAP 12 01/26/2023   EGFR 70 07/27/2023  Lab Results  Component Value Date   CHOL 178 07/27/2023   Lab Results  Component Value Date   HDL 31 (L) 07/27/2023   Lab Results  Component Value Date   LDLCALC 75 07/27/2023   Lab Results  Component Value Date   TRIG 452 (H) 07/27/2023   Lab Results  Component Value Date   CHOLHDL 5.7 (H) 07/27/2023   Lab Results  Component Value Date   HGBA1C 6.1 (H) 07/27/2023      Assessment &  Plan:  Essential hypertension Assessment & Plan: Controlled Compliant on losartan 50 mg daily Low-sodium diet with increased physical activity encouraged BP Readings from Last 3 Encounters:  08/03/23 116/86  04/06/23 107/70  02/06/23 115/76      Type 2 diabetes mellitus with hyperglycemia, without long-term current use of insulin (HCC) Assessment & Plan: The patient reports compliance on Trulicity 3 mg weekly, Jardiance 25 mg daily, metformin 500 mg daily, glipizide 2.5 mg daily Educated to decrease his intake of high sugar foods and beverages with increased physical activity No reports of polyuria, polyphagia, polydipsia Encouraged to continue treatment regiment Lab Results  Component Value Date   HGBA1C 6.1 (H) 07/27/2023     Orders: -     [facility] Diabetes Foot Exam  Other hyperlipidemia Assessment & Plan: Encouraged to take pravastatin 80 mg daily Educated to decrease his intake of greasy, fatty, starchy foods with increase physical activity Patient reports treatment compliance    Gastroesophageal reflux disease without esophagitis Assessment & Plan: Stable on Protonix 40 mg daily GERD diet encouraged   Tobacco abuse Assessment & Plan: Patient reports occasionally vaping Smoking cessation encouraged   Colon cancer screening -     Cologuard  Screening-pulmonary TB -     QuantiFERON-TB Gold Plus  Note: This chart has been completed using Engineer, civil (consulting) software, and while attempts have been made to ensure accuracy, certain words and phrases may not be transcribed as intended.    Follow-up: Return in about 4 months (around 12/01/2023).   Gilmore Laroche, FNP

## 2023-08-03 NOTE — Assessment & Plan Note (Signed)
Encouraged to take pravastatin 80 mg daily Educated to decrease his intake of greasy, fatty, starchy foods with increase physical activity Patient reports treatment compliance

## 2023-08-03 NOTE — Assessment & Plan Note (Signed)
The patient reports compliance on Trulicity 3 mg weekly, Jardiance 25 mg daily, metformin 500 mg daily, glipizide 2.5 mg daily Educated to decrease his intake of high sugar foods and beverages with increased physical activity No reports of polyuria, polyphagia, polydipsia Encouraged to continue treatment regiment Lab Results  Component Value Date   HGBA1C 6.1 (H) 07/27/2023

## 2023-08-03 NOTE — Assessment & Plan Note (Signed)
Stable on Protonix 40 mg daily GERD diet encouraged

## 2023-08-03 NOTE — Assessment & Plan Note (Signed)
Patient reports occasionally vaping Smoking cessation encouraged

## 2023-08-03 NOTE — Assessment & Plan Note (Signed)
Controlled Compliant on losartan 50 mg daily Low-sodium diet with increased physical activity encouraged BP Readings from Last 3 Encounters:  08/03/23 116/86  04/06/23 107/70  02/06/23 115/76

## 2023-08-03 NOTE — Patient Instructions (Addendum)
I appreciate the opportunity to provide care to you today!    Follow up:  4 months  Labs: please stop by the lab today to get your blood drawn (quantiferon TB gold plus)  Attached with your AVS, you will find valuable resources for self-education. I highly recommend dedicating some time to thoroughly examine them.   Please continue to a heart-healthy diet and increase your physical activities. Try to exercise for at least five days a week.    It was a pleasure to see you and I look forward to continuing to work together on your health and well-being. Please do not hesitate to call the office if you need care or have questions about your care.  In case of emergency, please visit the Emergency Department for urgent care, or contact our clinic at [phone] to schedule an appointment. We're here to help you!   Have a wonderful day and week. With Gratitude, Gilmore Laroche MSN, FNP-BC \

## 2023-08-04 ENCOUNTER — Other Ambulatory Visit: Payer: Self-pay

## 2023-08-07 LAB — QUANTIFERON-TB GOLD PLUS
QuantiFERON Mitogen Value: 7.23 [IU]/mL
QuantiFERON Nil Value: 0.06 [IU]/mL
QuantiFERON TB1 Ag Value: 0.06 [IU]/mL
QuantiFERON TB2 Ag Value: 0.07 [IU]/mL
QuantiFERON-TB Gold Plus: NEGATIVE

## 2023-08-09 ENCOUNTER — Encounter: Payer: Self-pay | Admitting: Family Medicine

## 2023-08-09 LAB — HM DIABETES EYE EXAM

## 2023-08-09 NOTE — Telephone Encounter (Signed)
yes

## 2023-08-10 NOTE — Telephone Encounter (Signed)
Patient picked up forms and results

## 2023-08-13 ENCOUNTER — Other Ambulatory Visit: Payer: Self-pay | Admitting: Family Medicine

## 2023-08-13 ENCOUNTER — Encounter: Payer: Self-pay | Admitting: Family Medicine

## 2023-08-13 DIAGNOSIS — E119 Type 2 diabetes mellitus without complications: Secondary | ICD-10-CM

## 2023-08-13 DIAGNOSIS — I10 Essential (primary) hypertension: Secondary | ICD-10-CM

## 2023-08-13 DIAGNOSIS — M1000 Idiopathic gout, unspecified site: Secondary | ICD-10-CM

## 2023-08-13 DIAGNOSIS — E785 Hyperlipidemia, unspecified: Secondary | ICD-10-CM

## 2023-08-18 ENCOUNTER — Other Ambulatory Visit: Payer: Self-pay | Admitting: Family Medicine

## 2023-08-18 ENCOUNTER — Other Ambulatory Visit: Payer: Self-pay

## 2023-08-18 ENCOUNTER — Encounter: Payer: Self-pay | Admitting: Family Medicine

## 2023-08-18 DIAGNOSIS — F419 Anxiety disorder, unspecified: Secondary | ICD-10-CM

## 2023-08-18 MED ORDER — SERTRALINE HCL 50 MG PO TABS: ORAL_TABLET | ORAL | 5 refills | Status: DC

## 2023-08-22 ENCOUNTER — Encounter: Payer: Self-pay | Admitting: Family Medicine

## 2023-08-23 ENCOUNTER — Other Ambulatory Visit: Payer: Self-pay

## 2023-08-23 DIAGNOSIS — E119 Type 2 diabetes mellitus without complications: Secondary | ICD-10-CM

## 2023-08-23 MED ORDER — TRULICITY 3 MG/0.5ML ~~LOC~~ SOAJ
3.0000 mg | SUBCUTANEOUS | 2 refills | Status: DC
Start: 2023-08-23 — End: 2023-10-08
  Filled 2023-08-23: qty 2, 28d supply, fill #0
  Filled 2023-09-22: qty 2, 28d supply, fill #1

## 2023-08-26 ENCOUNTER — Other Ambulatory Visit: Payer: Self-pay

## 2023-09-09 ENCOUNTER — Encounter: Payer: Self-pay | Admitting: Family Medicine

## 2023-09-10 ENCOUNTER — Other Ambulatory Visit: Payer: Self-pay

## 2023-09-10 MED ORDER — ALLOPURINOL 300 MG PO TABS: ORAL_TABLET | ORAL | 1 refills | Status: DC

## 2023-09-10 MED ORDER — PRAVASTATIN SODIUM 40 MG PO TABS: 40.0000 mg | ORAL_TABLET | Freq: Every day | ORAL | 2 refills | Status: DC

## 2023-09-10 MED ORDER — PRAVASTATIN SODIUM 40 MG PO TABS
40.0000 mg | ORAL_TABLET | Freq: Every day | ORAL | 2 refills | Status: DC
Start: 2023-09-10 — End: 2023-12-09

## 2023-09-11 ENCOUNTER — Other Ambulatory Visit: Payer: Self-pay | Admitting: Family Medicine

## 2023-09-14 ENCOUNTER — Other Ambulatory Visit: Payer: Self-pay | Admitting: Family Medicine

## 2023-09-14 DIAGNOSIS — E7849 Other hyperlipidemia: Secondary | ICD-10-CM

## 2023-09-14 MED ORDER — PRAVASTATIN SODIUM 80 MG PO TABS
80.0000 mg | ORAL_TABLET | Freq: Every day | ORAL | 1 refills | Status: DC
Start: 2023-09-14 — End: 2023-10-08

## 2023-09-14 NOTE — Telephone Encounter (Signed)
 Pt would like the new regimen medication rewritten to 80 mg instead of 40 mg twice daily.

## 2023-09-21 ENCOUNTER — Encounter: Payer: Self-pay | Admitting: Family Medicine

## 2023-09-21 NOTE — Telephone Encounter (Signed)
 Kindly schedule an office visit or video visit with any available provider.

## 2023-09-23 ENCOUNTER — Other Ambulatory Visit: Payer: Self-pay

## 2023-09-30 ENCOUNTER — Encounter: Payer: Self-pay | Admitting: Family Medicine

## 2023-10-03 ENCOUNTER — Other Ambulatory Visit: Payer: Self-pay | Admitting: Family Medicine

## 2023-10-03 DIAGNOSIS — R109 Unspecified abdominal pain: Secondary | ICD-10-CM

## 2023-10-03 MED ORDER — HYOSCYAMINE SULFATE ER 0.375 MG PO TB12: 0.3750 mg | ORAL_TABLET | Freq: Two times a day (BID) | ORAL | 0 refills | Status: DC | PRN

## 2023-10-03 NOTE — Telephone Encounter (Signed)
Rx sent

## 2023-10-07 ENCOUNTER — Ambulatory Visit: Payer: Self-pay | Admitting: Family Medicine

## 2023-10-09 DEATH — deceased

## 2023-10-12 ENCOUNTER — Other Ambulatory Visit: Payer: Self-pay | Admitting: Family Medicine

## 2023-10-12 ENCOUNTER — Ambulatory Visit: Payer: Self-pay | Admitting: Family Medicine

## 2023-10-29 ENCOUNTER — Other Ambulatory Visit: Payer: Self-pay | Admitting: Family Medicine

## 2023-10-29 DIAGNOSIS — E119 Type 2 diabetes mellitus without complications: Secondary | ICD-10-CM

## 2023-12-08 ENCOUNTER — Other Ambulatory Visit: Payer: Self-pay | Admitting: Family Medicine

## 2023-12-08 DIAGNOSIS — K219 Gastro-esophageal reflux disease without esophagitis: Secondary | ICD-10-CM
